# Patient Record
Sex: Female | Born: 1985 | Race: Asian | Hispanic: No | Marital: Married | Smoking: Former smoker
Health system: Southern US, Community
[De-identification: ages and names within clinical notes are randomized; demographics above are authoritative.]

## PROBLEM LIST (undated history)

## (undated) DIAGNOSIS — Z1509 Genetic susceptibility to other malignant neoplasm: Secondary | ICD-10-CM

## (undated) DIAGNOSIS — E119 Type 2 diabetes mellitus without complications: Secondary | ICD-10-CM

## (undated) DIAGNOSIS — F32A Depression, unspecified: Secondary | ICD-10-CM

## (undated) DIAGNOSIS — F329 Major depressive disorder, single episode, unspecified: Secondary | ICD-10-CM

## (undated) DIAGNOSIS — I10 Essential (primary) hypertension: Secondary | ICD-10-CM

## (undated) DIAGNOSIS — F419 Anxiety disorder, unspecified: Secondary | ICD-10-CM

## (undated) HISTORY — DX: Essential (primary) hypertension: I10

## (undated) HISTORY — PX: TUBAL LIGATION: SHX77

## (undated) HISTORY — DX: Type 2 diabetes mellitus without complications: E11.9

## (undated) HISTORY — PX: CARPAL TUNNEL RELEASE: SHX101

## (undated) HISTORY — DX: Anxiety disorder, unspecified: F41.9

## (undated) HISTORY — DX: Depression, unspecified: F32.A

## (undated) HISTORY — DX: Genetic susceptibility to other malignant neoplasm: Z15.09

---
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
## 2007-04-22 ENCOUNTER — Ambulatory Visit: Payer: Self-pay | Admitting: Family Medicine

## 2011-07-11 ENCOUNTER — Ambulatory Visit: Payer: Self-pay | Admitting: Obstetrics and Gynecology

## 2011-07-21 ENCOUNTER — Encounter: Payer: Self-pay | Admitting: Maternal & Fetal Medicine

## 2011-07-21 LAB — URINALYSIS, COMPLETE
Bacteria: NONE SEEN
Bilirubin,UR: NEGATIVE
Blood: NEGATIVE
Glucose,UR: 50 mg/dL (ref 0–75)
Leukocyte Esterase: NEGATIVE
Nitrite: NEGATIVE
RBC,UR: 1 /HPF (ref 0–5)
Squamous Epithelial: 5
WBC UR: 2 /HPF (ref 0–5)

## 2011-08-04 ENCOUNTER — Encounter: Payer: Self-pay | Admitting: Obstetrics and Gynecology

## 2011-08-04 LAB — URINALYSIS, COMPLETE
Bacteria: NONE SEEN
Blood: NEGATIVE
Glucose,UR: NEGATIVE mg/dL (ref 0–75)
Nitrite: NEGATIVE
Ph: 6 (ref 4.5–8.0)
RBC,UR: 2 /HPF (ref 0–5)
Specific Gravity: 1.025 (ref 1.003–1.030)

## 2011-08-11 ENCOUNTER — Ambulatory Visit: Payer: Self-pay | Admitting: Obstetrics and Gynecology

## 2011-10-07 ENCOUNTER — Encounter: Payer: Self-pay | Admitting: Pediatric Cardiology

## 2012-01-23 ENCOUNTER — Inpatient Hospital Stay: Payer: Self-pay

## 2012-01-24 LAB — BASIC METABOLIC PANEL
Anion Gap: 8 (ref 7–16)
BUN: 6 mg/dL — ABNORMAL LOW (ref 7–18)
Calcium, Total: 8.4 mg/dL — ABNORMAL LOW (ref 8.5–10.1)
Chloride: 108 mmol/L — ABNORMAL HIGH (ref 98–107)
Co2: 23 mmol/L (ref 21–32)
Co2: 24 mmol/L (ref 21–32)
Creatinine: 0.62 mg/dL (ref 0.60–1.30)
EGFR (African American): >60
EGFR (Non-African Amer.): >60
Glucose: 93 mg/dL (ref 65–99)
Glucose: 105 mg/dL — ABNORMAL HIGH (ref 65–99)
Osmolality: 274 (ref 275–301)
Potassium: 3.8 mmol/L (ref 3.5–5.1)
Sodium: 141 mmol/L (ref 136–145)
Sodium: 138 mmol/L (ref 136–145)

## 2012-01-24 LAB — CBC WITH DIFFERENTIAL/PLATELET
Basophil %: 0.5 %
HCT: 35.4 % (ref 35.0–47.0)
HGB: 11.6 g/dL — ABNORMAL LOW (ref 12.0–16.0)
MCH: 26.3 pg (ref 26.0–34.0)
MCHC: 32.8 g/dL (ref 32.0–36.0)
MCV: 80 fL (ref 80–100)
Monocyte #: 0.8 x10 3/mm (ref 0.2–0.9)
Neutrophil #: 9.9 10*3/uL — ABNORMAL HIGH (ref 1.4–6.5)
RBC: 4.4 10*6/uL (ref 3.80–5.20)
RDW: 14.8 % — ABNORMAL HIGH (ref 11.5–14.5)

## 2012-01-24 LAB — PLATELET COUNT: Platelet: 261 10*3/uL (ref 150–440)

## 2012-01-24 LAB — GLUCOSE, RANDOM: Glucose: 115 mg/dL — ABNORMAL HIGH (ref 65–99)

## 2012-01-25 LAB — HEMATOCRIT: HCT: 36 %

## 2012-03-30 ENCOUNTER — Ambulatory Visit: Payer: Self-pay | Admitting: Obstetrics and Gynecology

## 2012-03-30 LAB — URINALYSIS, COMPLETE
Glucose,UR: NEGATIVE mg/dL (ref 0–75)
Granular Cast: 1
Ketone: NEGATIVE
Nitrite: NEGATIVE
Ph: 5 (ref 4.5–8.0)
Protein: NEGATIVE
Specific Gravity: 1.005 (ref 1.003–1.030)

## 2012-03-30 LAB — GLUCOSE, RANDOM: Glucose: 109 mg/dL — ABNORMAL HIGH (ref 65–99)

## 2012-03-30 LAB — HEMOGLOBIN: HGB: 13.5 g/dL (ref 12.0–16.0)

## 2012-03-30 LAB — PREGNANCY, URINE: Pregnancy Test, Urine: NEGATIVE m[IU]/mL

## 2012-04-05 ENCOUNTER — Ambulatory Visit: Payer: Self-pay | Admitting: Obstetrics and Gynecology

## 2013-05-04 IMAGING — US US OB NUCHAL TRANSLUCENCY 1ST GEST - [facility] NRPT
1 series · 14 of 28 positions shown · non-contrast
Comparison: none

[Series 1: us ob nuchal translucency 1st gest - [facility] nrpt · 14 of 51 slices shown]
[im 2/51]
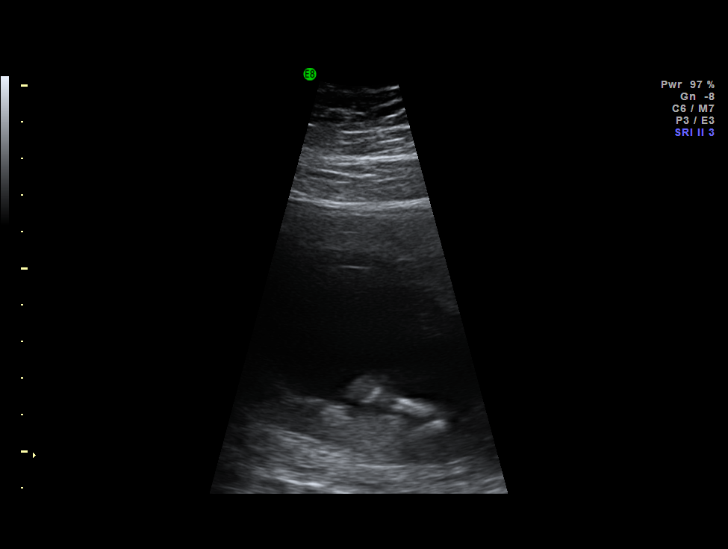
[im 6/51]
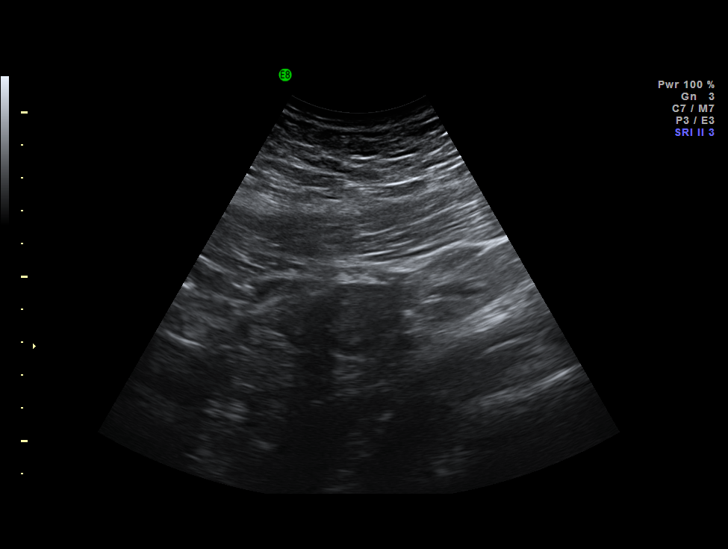
[im 10/51]
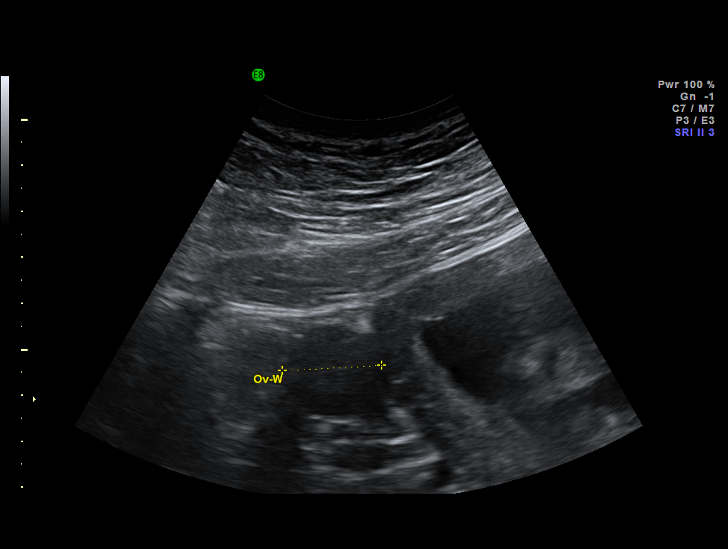
[im 13/51]
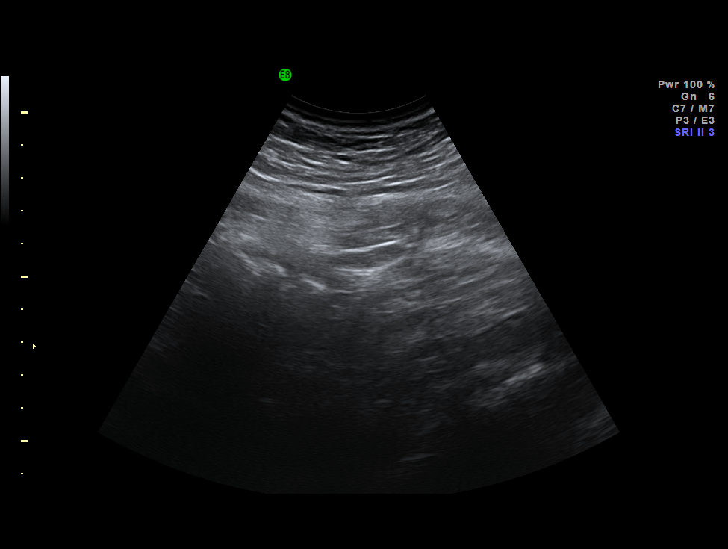
[im 17/51]
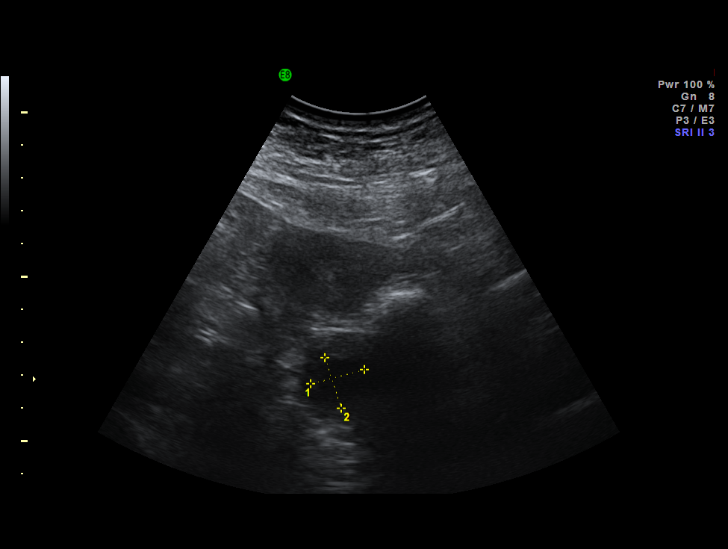
[im 21/51]
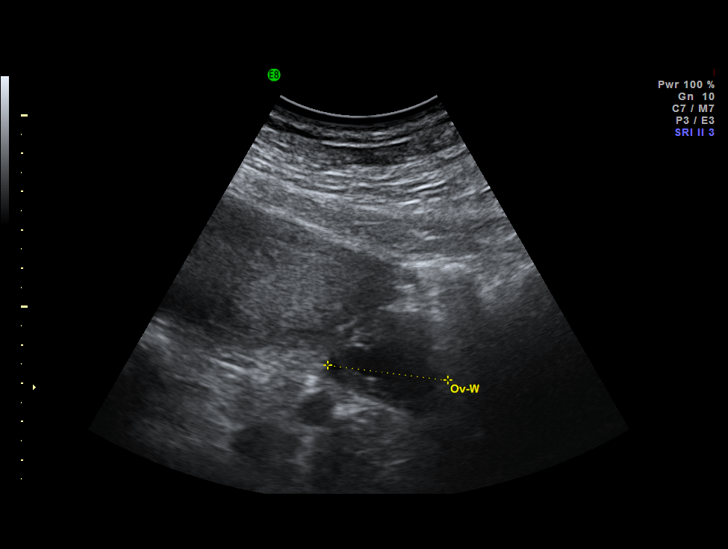
[im 25/51]
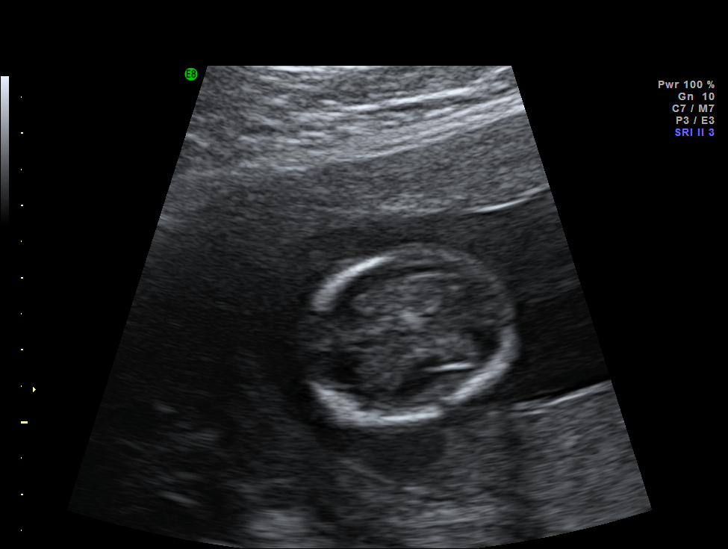
[im 28/51]
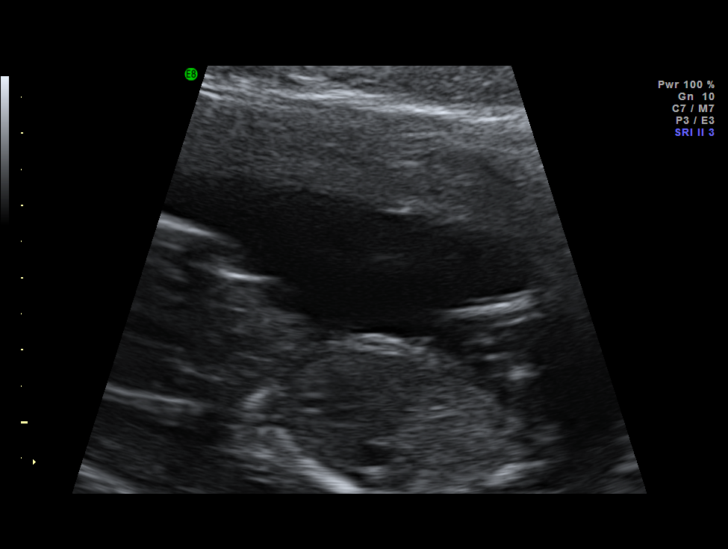
[im 32/51]
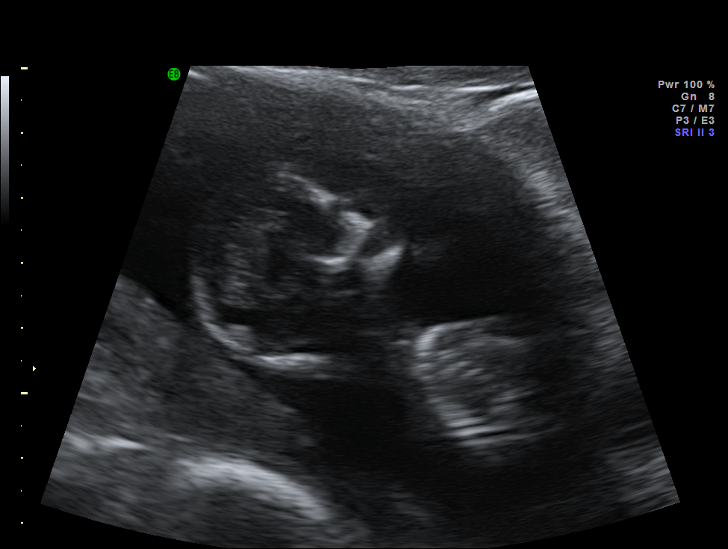
[im 36/51]
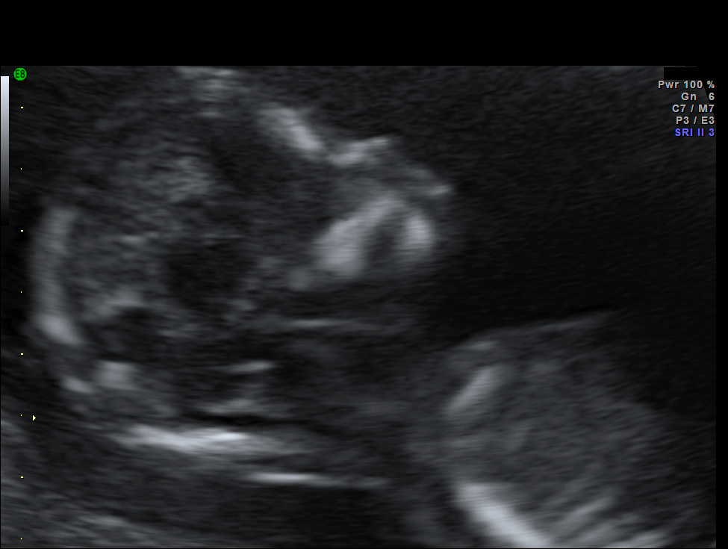
[im 39/51]
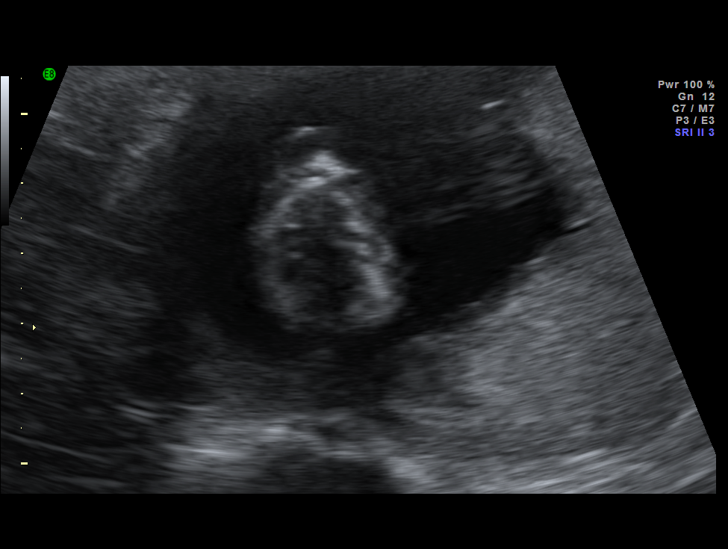
[im 43/51]
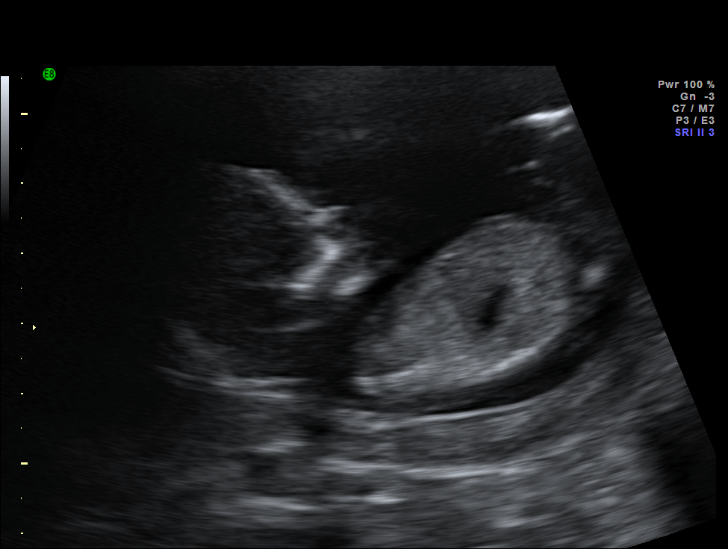
[im 47/51]
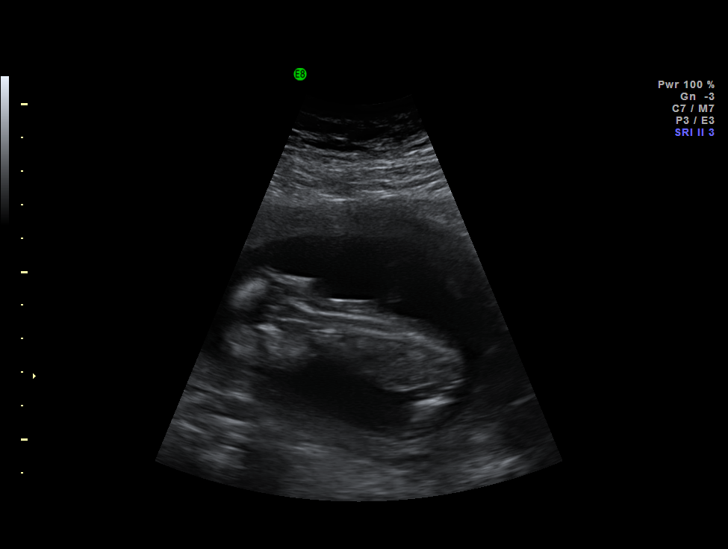
[im 51/51]
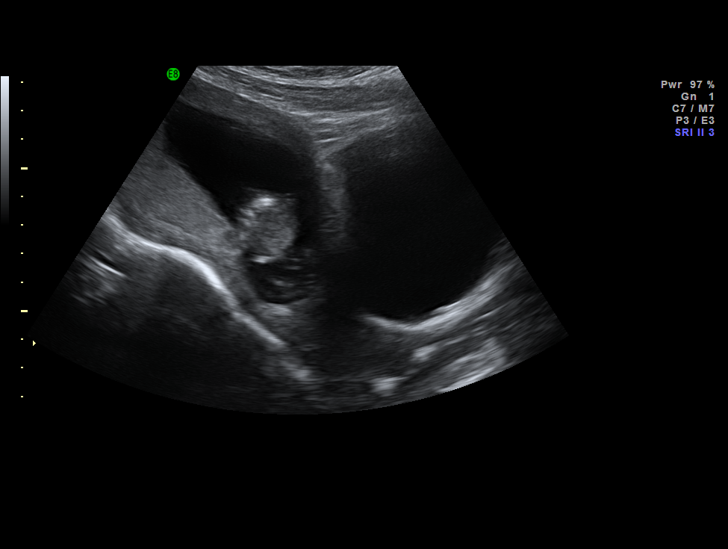

[14 of 28 positions shown; findings below may reference images not displayed]

IMAGES IMPORTED FROM THE SYNGO WORKFLOW SYSTEM
NO DICTATION FOR STUDY

## 2013-05-16 LAB — HM DIABETES EYE EXAM

## 2013-05-31 ENCOUNTER — Encounter (INDEPENDENT_AMBULATORY_CARE_PROVIDER_SITE_OTHER): Payer: Self-pay

## 2013-05-31 ENCOUNTER — Encounter: Payer: Self-pay | Admitting: Adult Health

## 2013-05-31 ENCOUNTER — Ambulatory Visit (INDEPENDENT_AMBULATORY_CARE_PROVIDER_SITE_OTHER): Payer: BC Managed Care – PPO | Admitting: Adult Health

## 2013-05-31 VITALS — BP 108/68 | HR 88 | Temp 98.2°F | Resp 12 | Ht 62.5 in | Wt 212.0 lb

## 2013-05-31 DIAGNOSIS — E119 Type 2 diabetes mellitus without complications: Secondary | ICD-10-CM | POA: Insufficient documentation

## 2013-05-31 DIAGNOSIS — F17210 Nicotine dependence, cigarettes, uncomplicated: Secondary | ICD-10-CM | POA: Insufficient documentation

## 2013-05-31 DIAGNOSIS — F17200 Nicotine dependence, unspecified, uncomplicated: Secondary | ICD-10-CM

## 2013-05-31 DIAGNOSIS — E1165 Type 2 diabetes mellitus with hyperglycemia: Secondary | ICD-10-CM

## 2013-05-31 DIAGNOSIS — IMO0001 Reserved for inherently not codable concepts without codable children: Secondary | ICD-10-CM

## 2013-05-31 DIAGNOSIS — Z716 Tobacco abuse counseling: Secondary | ICD-10-CM

## 2013-05-31 DIAGNOSIS — E785 Hyperlipidemia, unspecified: Secondary | ICD-10-CM | POA: Insufficient documentation

## 2013-05-31 DIAGNOSIS — Z7189 Other specified counseling: Secondary | ICD-10-CM

## 2013-05-31 DIAGNOSIS — Z0000 Encounter for general adult medical examination without abnormal findings: Secondary | ICD-10-CM

## 2013-05-31 DIAGNOSIS — Z794 Long term (current) use of insulin: Secondary | ICD-10-CM | POA: Insufficient documentation

## 2013-05-31 MED ORDER — METFORMIN HCL 1000 MG PO TABS: 1000.0000 mg | ORAL_TABLET | Freq: Two times a day (BID) | ORAL | Status: DC

## 2013-05-31 MED ORDER — ROSUVASTATIN CALCIUM 10 MG PO TABS: 10.0000 mg | ORAL_TABLET | Freq: Every day | ORAL | Status: DC

## 2013-05-31 NOTE — Assessment & Plan Note (Signed)
Increase metformin to 1000 mg bid with meals. Check HgbA1c. Check blood glucose before meals and at bedtime. Record. Return in 2 weeks. Consider insulin if HgbA1c elevated. Discussed with pt.

## 2013-05-31 NOTE — Assessment & Plan Note (Signed)
On crestor 10 mg. Check lipids in February. Prescription provided to have done at [facility]

## 2013-05-31 NOTE — Assessment & Plan Note (Signed)
Already has multiple risk factors for heart dz - weight, diabetes, HLD, Hypertriglyceridemia. She had quit smoking when first diagnosed with HLD back in October. Mother just was diagnosed with breast cancer and pt began smoking again. Discussed at length importance of smoking cessation. She is determined to quit again. Will continue to follow.

## 2013-05-31 NOTE — Patient Instructions (Signed)
  Start metformin 1000 mg twice a day with breakfast and dinner.  Check your blood sugar before breakfast, before lunch, before dinner, and before bedtime. Do this for 2 weeks and record.  Return for follow up appointment in 2 weeks with your recorded blood sugars

## 2013-05-31 NOTE — Progress Notes (Signed)
Pre visit review using our clinic review tool, if applicable. No additional management support is needed unless otherwise documented below in the visit note. 

## 2013-05-31 NOTE — Assessment & Plan Note (Signed)
Normal physical exam. Check labs cbc w/diff, tsh, lipids, cmet. Request labs from [facility]. Recommend smoking cessation. Discussed at length.

## 2013-05-31 NOTE — Progress Notes (Signed)
Subjective:    Patient ID: Allison [name], female    DOB: [birth year], 28 y.o.   MRN: [id number]  HPI  Allison [name] is a pleasant 28 y/o female who presents to establish care. She has been going to the wellness clinic at [facility] who has referred her to our office. Recently diagnosed with Hypertriglyceridemia, HLD and DM. On Metformin 500 mg daily with reported blood glucose levels running in the mid 200s. Hx of Gestational diabetes. For her elevated triglycerides she is taking Crestor 10 mg daily. Recent labs in November. Repeat in February. Has not had HgbA1c checked. She is feeling well today.     Past Medical History  Diagnosis Date  . Diabetes mellitus without complication      Past Surgical History  Procedure Laterality Date  . Cholecystectomy  2003     Family History  Problem Relation Age of Onset  . Cancer Mother 78    breast cancer  . Diabetes Mother   . Multiple sclerosis Father 75    Progressive - not diagnosed until age 43  . Diabetes Paternal Aunt   . Diabetes Maternal Grandfather   . Diabetes Paternal Aunt      History   Social History  . Marital Status: Married    Spouse Name: Richard    Number of Children: 2  . Years of Education: 14   Occupational History  . Physical Department/Environmental Services [employer]   Social History Main Topics  . Smoking status: Current Every Day Smoker -- 0.25 packs/day    Types: Cigarettes  . Smokeless tobacco: Never Used  . Alcohol Use: Yes     Comment: Rare   . Drug Use: No  . Sexual Activity: Yes   Other Topics Concern  . Not on file   Social History Narrative   Allison [name] grew up in [place], [place]. She is married and has 2 children. She enjoys spending time with her family. She enjoys reading and shopping.       Smokes < 10 cigarettes daily - trying to quit   Caffeine - drinks 3 cans of diet pepsi daily   Alcohol - rare      Review of Systems  Constitutional: Negative.   HENT: Negative.   Eyes: Negative.     Respiratory: Negative.   Cardiovascular: Negative.   Gastrointestinal: Negative.   Endocrine: Negative.   Genitourinary: Negative.   Musculoskeletal: Negative.   Skin: Negative.   Allergic/Immunologic: Negative.   Neurological: Negative.   Hematological: Negative.   Psychiatric/Behavioral: Negative.        Objective:   Physical Exam  Constitutional: She is oriented to person, place, and time. She appears well-developed and well-nourished. No distress.  HENT:  Head: Normocephalic and atraumatic.  Right Ear: External ear normal.  Left Ear: External ear normal.  Nose: Nose normal.  Mouth/Throat: Oropharynx is clear and moist.  Eyes: Conjunctivae and EOM are normal. Pupils are equal, round, and reactive to light.  Neck: Normal range of motion. Neck supple. No tracheal deviation present. No thyromegaly present.  Cardiovascular: Normal rate, regular rhythm, normal heart sounds and intact distal pulses.  Exam reveals no gallop and no friction rub.   No murmur heard. Pulmonary/Chest: Effort normal and breath sounds normal. No respiratory distress. She has no wheezes. She has no rales.  Abdominal: Soft. Bowel sounds are normal. She exhibits no distension and no mass. There is no tenderness. There is no rebound and no guarding.  Musculoskeletal: Normal range of motion. She exhibits  no edema and no tenderness.  Lymphadenopathy:    She has no cervical adenopathy.  Neurological: She is alert and oriented to person, place, and time. She has normal reflexes. No cranial nerve deficit. Coordination normal.  Skin: Skin is warm and dry.  Psychiatric: She has a normal mood and affect. Her behavior is normal. Judgment and thought content normal.       Assessment & Plan:

## 2013-06-03 ENCOUNTER — Telehealth: Payer: Self-pay

## 2013-06-03 NOTE — Telephone Encounter (Signed)
Relevant patient education assigned to patient using Emmi. ° °

## 2013-06-09 ENCOUNTER — Telehealth: Payer: Self-pay | Admitting: Adult Health

## 2013-06-09 ENCOUNTER — Other Ambulatory Visit: Payer: Self-pay | Admitting: Adult Health

## 2013-06-09 MED ORDER — INSULIN DETEMIR 100 UNIT/ML FLEXPEN: 10.0000 [IU] | PEN_INJECTOR | Freq: Every day | SUBCUTANEOUS | Status: DC

## 2013-06-09 NOTE — Telephone Encounter (Signed)
HgbA1c is 11.3%. She is on metformin bid. Start Levemir 10 units at bedtime. Return for follow up in 1 month. If she needs instruction on using the flexpen, please have her bring her pen and set up a nurse visit for instruction.

## 2013-06-10 NOTE — Telephone Encounter (Signed)
Left message with pt's husband, pt does not have a way to be contacted. He will have her call back Monday. Rx at desk until pharmacy given.

## 2013-06-13 ENCOUNTER — Encounter: Payer: Self-pay | Admitting: *Deleted

## 2013-06-13 ENCOUNTER — Telehealth: Payer: Self-pay | Admitting: Adult Health

## 2013-06-13 NOTE — Telephone Encounter (Signed)
Relevant patient education assigned to patient using Emmi. ° °

## 2013-06-15 ENCOUNTER — Encounter: Payer: Self-pay | Admitting: *Deleted

## 2013-06-15 NOTE — Telephone Encounter (Signed)
Unable to reach pt, letter mailed with information and request to call office to discuss.

## 2013-06-20 ENCOUNTER — Ambulatory Visit: Payer: BC Managed Care – PPO | Admitting: Adult Health

## 2013-06-27 ENCOUNTER — Ambulatory Visit (INDEPENDENT_AMBULATORY_CARE_PROVIDER_SITE_OTHER): Payer: BC Managed Care – PPO | Admitting: Adult Health

## 2013-06-27 ENCOUNTER — Encounter: Payer: Self-pay | Admitting: Adult Health

## 2013-06-27 VITALS — BP 108/64 | HR 86 | Temp 98.1°F | Resp 14 | Wt 218.0 lb

## 2013-06-27 DIAGNOSIS — E1165 Type 2 diabetes mellitus with hyperglycemia: Principal | ICD-10-CM

## 2013-06-27 DIAGNOSIS — IMO0001 Reserved for inherently not codable concepts without codable children: Secondary | ICD-10-CM

## 2013-06-27 MED ORDER — INSULIN ASPART PROT & ASPART (70-30 MIX) 100 UNIT/ML PEN: 10.0000 [IU] | PEN_INJECTOR | Freq: Two times a day (BID) | SUBCUTANEOUS | Status: DC

## 2013-06-27 NOTE — Progress Notes (Signed)
Pre visit review using our clinic review tool, if applicable. No additional management support is needed unless otherwise documented below in the visit note. 

## 2013-06-27 NOTE — Progress Notes (Signed)
Patient ID: Allison [name], female   DOB: 12/02/1985, 28 y.o.   MRN: [id number]  .   Subjective:    Patient ID: Allison [name], female    DOB: 12/02/1985, 28 y.o.   MRN: [id number]  HPI  Allison [name] is here for 2 week follow up for diabetes. I started her on Levemir and increased her metformin to 1000 mg bid. She never started the Levemir 2/2 cost but she did not call to inform me of this.  Her blood glucose levels are still too high running in the 200s to 300s.   Past Medical History  Diagnosis Date  . Diabetes mellitus without complication     Current Outpatient Prescriptions on File Prior to Visit  Medication Sig Dispense Refill  . metFORMIN (GLUCOPHAGE) 1000 MG tablet Take 1 tablet (1,000 mg total) by mouth 2 (two) times daily with a meal.  60 tablet  3  . rosuvastatin (CRESTOR) 10 MG tablet Take 1 tablet (10 mg total) by mouth daily.  30 tablet  3  . VITAMIN D, CHOLECALCIFEROL, PO Take 1,000 Units by mouth 2 (two) times daily.        No current facility-administered medications on file prior to visit.     Review of Systems  Constitutional: Negative.   HENT: Negative.   Eyes: Negative.   Respiratory: Negative.   Cardiovascular: Negative.   Gastrointestinal: Negative.   Endocrine: Negative.   Genitourinary: Negative.   Musculoskeletal: Negative.   Skin: Negative.   Allergic/Immunologic: Negative.   Neurological: Negative.   Hematological: Negative.   Psychiatric/Behavioral: Negative.        Objective:  BP 108/64  Pulse 86  Temp(Src) 98.1 F (36.7 C) (Oral)  Resp 14  Wt 218 lb (98.884 kg)  SpO2 97%  LMP 06/03/2013   Physical Exam  Constitutional: She is oriented to person, place, and time. No distress.  HENT:  Head: Normocephalic and atraumatic.  Eyes: Conjunctivae and EOM are normal.  Neck: Normal range of motion. Neck supple.  Cardiovascular: Normal rate and regular rhythm.   Pulmonary/Chest: Effort normal. No respiratory distress.  Abdominal: Soft. Bowel sounds  are normal.  Musculoskeletal: Normal range of motion. She exhibits no edema.  Neurological: She is alert and oriented to person, place, and time. She has normal reflexes. Coordination normal.  Skin: Skin is warm and dry.  Psychiatric: She has a normal mood and affect. Her behavior is normal. Judgment and thought content normal.       Assessment & Plan:   1. Diabetes mellitus type 2, uncontrolled, without complications Switch to NovoLog Mix 70/30 - start with 10 units before breakfast and 10 units before dinner. Monitor fasting BG 4 times a day for 2 weeks and send in records for adjustments of insulin as needed. Continue metformin. Will recheck A1C in 3 months.

## 2013-06-27 NOTE — Patient Instructions (Signed)
  Start NovoLog Mix 70/30 insulin 10 units before breakfast and 10 units before dinner.  Monitor your blood glucose levels before every meal and before bedtime and record.  Please send me a records of your blood glucose level in approximately 2 weeks. We will make adjustments based on these levels.

## 2013-06-29 ENCOUNTER — Telehealth: Payer: Self-pay | Admitting: Adult Health

## 2013-06-29 NOTE — Telephone Encounter (Signed)
Relevant patient education assigned to patient using Emmi. ° °

## 2013-06-30 ENCOUNTER — Telehealth: Payer: Self-pay | Admitting: Adult Health

## 2013-06-30 NOTE — Telephone Encounter (Signed)
The patient has tried to get two different insuline prescription filled ,but both have cost to much. Is there a less expensive alternative.

## 2013-07-01 MED ORDER — INSULIN ASPART PROT & ASPART (70-30 MIX) 100 UNIT/ML PEN: 10.0000 [IU] | PEN_INJECTOR | Freq: Two times a day (BID) | SUBCUTANEOUS | Status: DC

## 2013-07-01 NOTE — Telephone Encounter (Signed)
Spoke with pt. One pen was going to cost her $105 at local pharmacy. She had already called her insurance, was told it would be cheaper if she used [facility], $70 for 3 months supply. States [facility] had faxed Rx request. I don't see where we have received it, so escripted Novolog 70/30 flex pen to [facility]. She was told it may take 5 days for her to get the Rx. Advised pt that I left a coupon up front for pick up for 30 day trial of novolog 70/30 for her to take to local pharmacy to get started on her insulin prior to mail order arriving.

## 2013-07-01 NOTE — Telephone Encounter (Signed)
The NovoLog Mix 70/30 is on her preferred list with [employer]. Can you find out how much she was having to pay for this?

## 2013-10-31 ENCOUNTER — Ambulatory Visit: Payer: BC Managed Care – PPO | Admitting: Adult Health

## 2013-12-05 ENCOUNTER — Encounter: Payer: Self-pay | Admitting: Adult Health

## 2013-12-05 ENCOUNTER — Other Ambulatory Visit: Payer: Self-pay

## 2013-12-05 ENCOUNTER — Ambulatory Visit (INDEPENDENT_AMBULATORY_CARE_PROVIDER_SITE_OTHER): Payer: BC Managed Care – PPO | Admitting: Adult Health

## 2013-12-05 VITALS — BP 134/87 | HR 84 | Temp 98.4°F | Resp 14 | Wt 223.0 lb

## 2013-12-05 DIAGNOSIS — E1165 Type 2 diabetes mellitus with hyperglycemia: Principal | ICD-10-CM

## 2013-12-05 DIAGNOSIS — IMO0001 Reserved for inherently not codable concepts without codable children: Secondary | ICD-10-CM

## 2013-12-05 DIAGNOSIS — Z7182 Exercise counseling: Secondary | ICD-10-CM | POA: Insufficient documentation

## 2013-12-05 DIAGNOSIS — E785 Hyperlipidemia, unspecified: Secondary | ICD-10-CM

## 2013-12-05 MED ORDER — INSULIN PEN NEEDLE 32G X 6 MM MISC: Status: DC

## 2013-12-05 MED ORDER — ROSUVASTATIN CALCIUM 10 MG PO TABS: 10.0000 mg | ORAL_TABLET | Freq: Every day | ORAL | Status: DC

## 2013-12-05 MED ORDER — ACCU-CHEK MULTICLIX LANCETS MISC: Status: DC

## 2013-12-05 MED ORDER — GLUCOSE BLOOD VI STRP: ORAL_STRIP | Status: DC

## 2013-12-05 NOTE — Progress Notes (Signed)
Patient ID: Allison [name], female   DOB: 25-Nov-1985, 28 y.o.   MRN: [id number]   Subjective:    Patient ID: Allison DeutscherKimberly [name], female    DOB: 25-Nov-1985, 28 y.o.   MRN: [id number]  HPI  Allison BradfordKimberly is a pleasant 28 y/o female with hx of uncontrolled DM type 2. She was last seen in clinic in February and asked to monitor BG 4 times daily for 2 weeks since she was beginning the insulin 70/30. She did not bring record of BG with her. She reports that her numbers have improved. Last HgbA1c was > 11% and I wanted to see her for recheck in 3 months. She did not make her appt 2/2 reports that her father was hospitalized. She reports that she has been "doing better" for the last month. Very little to no exercise. Diet is still not where it needs to be but also reports improvement. Was previously eating little debbie cakes, chips and other junk/non nutritious foods. She reports she has cut out all soft drinks, doesn't eat the little debbie cakes any more and no chips.  Past Medical History  Diagnosis Date  . Diabetes mellitus without complication     Current Outpatient Prescriptions on File Prior to Visit  Medication Sig Dispense Refill  . Insulin Aspart Prot & Aspart (NOVOLOG MIX 70/30 FLEXPEN) (70-30) 100 UNIT/ML Pen Inject 10 Units into the skin 2 (two) times daily.  7 pen  1  . metFORMIN (GLUCOPHAGE) 1000 MG tablet Take 1 tablet (1,000 mg total) by mouth 2 (two) times daily with a meal.  60 tablet  3  . VITAMIN D, CHOLECALCIFEROL, PO Take 1,000 Units by mouth 2 (two) times daily.        No current facility-administered medications on file prior to visit.     Review of Systems  Constitutional: Negative.   HENT: Negative.   Eyes: Negative.   Respiratory: Negative.   Cardiovascular: Negative.   Gastrointestinal: Negative.   Endocrine: Negative.   Genitourinary: Negative.   Musculoskeletal: Negative.   Skin: Negative.   Allergic/Immunologic: Negative.   Neurological: Negative.   Hematological:  Negative.   Psychiatric/Behavioral: Negative.        Objective:  BP 134/87  Pulse 84  Temp(Src) 98.4 F (36.9 C) (Oral)  Resp 14  Wt 223 lb (101.152 kg)  SpO2 99%   Physical Exam  Constitutional: She is oriented to person, place, and time. No distress.  Overweight, pleasant 28 y/o female  HENT:  Head: Normocephalic and atraumatic.  Eyes: Conjunctivae and EOM are normal.  Neck: Normal range of motion. Neck supple.  Cardiovascular: Normal rate, regular rhythm, normal heart sounds and intact distal pulses.  Exam reveals no gallop and no friction rub.   No murmur heard. Pulmonary/Chest: Effort normal and breath sounds normal. No respiratory distress. She has no wheezes. She has no rales.  Musculoskeletal: Normal range of motion.  Neurological: She is alert and oriented to person, place, and time. She has normal reflexes. Coordination normal.  Skin: Skin is warm and dry.  Psychiatric: She has a normal mood and affect. Her behavior is normal. Judgment and thought content normal.      Assessment & Plan:   1. Diabetes mellitus type 2, uncontrolled, without complications Reports that she has been "doing better" for the last month. She was supposed to follow up in May and missed appt. Needs to take her diabetes seriously. We discussed this in detail. She is young and if she does not control this  the results will be end organ damage for the future. Discussed that there are many people who live with diabetes and this is a controlled dz. She needs to lose weight and exercise which will greatly improve her numbers and the overall way she feels. I am checking labs. She is on 70/30 insulin 10 units bid. She is also on metformin 1000 mg bid with meals. She had not been consistent with metformin until ~ 1 month ago. Follow up 3 months.  2. HLD (hyperlipidemia) Restarted her Crestor. She ran out of medication and did not get this refilled. This was originally prescribed by another provider. Her  triglycerides were upwards of 700. Last lipids showed triglycerides in the 200. Discussed needing to get this below 150 through diet and exercise. Ordered fasting lipids. She has all labs done through [facility].  3. Exercise counseling Allison [name] has not exercised in years. She recently started running for 15 min but states she was exhausted. The saying "you have to walk before you run" totally applies to this scenario. I explained to her that she has to build up her stamina. Of course she is exhausted after running 15 min! Her body is is shock from doing any exercise. Discussed with her, at length, that she needs to begin walking briskly. If she can have a conversation while walking she is doing it too slow. Start by walking 15 min but needs to increase time. Has to set goals and try to attain one at a time. She agreed and was going to begin doing this.

## 2013-12-05 NOTE — Progress Notes (Signed)
Pre visit review using our clinic review tool, if applicable. No additional management support is needed unless otherwise documented below in the visit note. 

## 2013-12-07 LAB — LIPID PANEL
Cholesterol: 134 mg/dL (ref 0–200)
HDL: 25 mg/dL — AB (ref 35–70)
LDL Cholesterol: 62 mg/dL
Triglycerides: 235 mg/dL — AB (ref 40–160)

## 2013-12-07 LAB — HEMOGLOBIN A1C: Hgb A1c MFr Bld: 7.9 % — AB (ref 4.0–6.0)

## 2013-12-14 ENCOUNTER — Encounter: Payer: Self-pay | Admitting: *Deleted

## 2013-12-23 ENCOUNTER — Telehealth: Payer: Self-pay | Admitting: Adult Health

## 2013-12-23 NOTE — Telephone Encounter (Signed)
Results labs

## 2014-01-24 ENCOUNTER — Other Ambulatory Visit: Payer: Self-pay | Admitting: Adult Health

## 2014-01-27 ENCOUNTER — Other Ambulatory Visit: Payer: Self-pay | Admitting: Adult Health

## 2014-01-27 MED ORDER — METFORMIN HCL 1000 MG PO TABS: ORAL_TABLET | ORAL | Status: DC

## 2014-02-15 ENCOUNTER — Telehealth: Payer: Self-pay

## 2014-02-15 MED ORDER — INSULIN ASPART PROT & ASPART (70-30 MIX) 100 UNIT/ML PEN: 10.0000 [IU] | PEN_INJECTOR | Freq: Two times a day (BID) | SUBCUTANEOUS | Status: DC

## 2014-02-15 NOTE — Telephone Encounter (Signed)
Pt called and is hoping to get a refill of her Novolog  ([facility]  Callback [phone]

## 2014-03-02 ENCOUNTER — Telehealth: Payer: Self-pay

## 2014-03-02 NOTE — Telephone Encounter (Signed)
Unable to contact pt as phone is not in service.  However pt should have 5 refills remaining from 9.15.15 Rx,

## 2014-03-02 NOTE — Telephone Encounter (Signed)
The patient called and is in need of her metformin refilled.  She is planning on establishing care with the new NP when her schedule is open, but is need of this med until she can establish care.

## 2014-06-05 ENCOUNTER — Ambulatory Visit: Payer: Self-pay | Admitting: Nurse Practitioner

## 2014-06-19 ENCOUNTER — Ambulatory Visit: Payer: Self-pay | Admitting: Nurse Practitioner

## 2014-06-22 ENCOUNTER — Encounter: Payer: Self-pay | Admitting: Nurse Practitioner

## 2014-06-22 ENCOUNTER — Ambulatory Visit (INDEPENDENT_AMBULATORY_CARE_PROVIDER_SITE_OTHER): Payer: BLUE CROSS/BLUE SHIELD | Admitting: Nurse Practitioner

## 2014-06-22 VITALS — BP 122/80 | HR 82 | Temp 98.2°F | Resp 12 | Ht 62.5 in | Wt 195.0 lb

## 2014-06-22 DIAGNOSIS — IMO0001 Reserved for inherently not codable concepts without codable children: Secondary | ICD-10-CM

## 2014-06-22 DIAGNOSIS — E1165 Type 2 diabetes mellitus with hyperglycemia: Secondary | ICD-10-CM

## 2014-06-22 DIAGNOSIS — F4310 Post-traumatic stress disorder, unspecified: Secondary | ICD-10-CM | POA: Insufficient documentation

## 2014-06-22 DIAGNOSIS — Z716 Tobacco abuse counseling: Secondary | ICD-10-CM

## 2014-06-22 MED ORDER — METFORMIN HCL 1000 MG PO TABS: ORAL_TABLET | ORAL | Status: DC

## 2014-06-22 MED ORDER — GLUCOSE BLOOD VI STRP: ORAL_STRIP | Status: DC

## 2014-06-22 MED ORDER — FLUOXETINE HCL 20 MG PO TABS: 20.0000 mg | ORAL_TABLET | Freq: Every day | ORAL | Status: DC

## 2014-06-22 NOTE — Patient Instructions (Addendum)
We will follow up in 6 weeks for diabetes and anxiety  Watch for low blood sugars- this may increase hypoglycemia
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
## 2014-06-22 NOTE — Assessment & Plan Note (Signed)
Pt interested in quitting smoking again. Going to [facility] center [facility] and getting advice. She states she likes the nicorette gum and they have a new flavor she is excited to try.

## 2014-06-22 NOTE — Progress Notes (Signed)
Pre visit review using our clinic review tool, if applicable. No additional management support is needed unless otherwise documented below in the visit note. 
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
## 2014-06-22 NOTE — Assessment & Plan Note (Signed)
Stable. Continue Metformin twice daily and Novolog Mix 70/30 16 units twice daily. Asked her to watch blood sugars for lows. Called in more strips for the patient. FU 3 months. Getting blood work at [facility].

## 2014-07-24 LAB — LIPID PANEL
Cholesterol: 161 mg/dL (ref 0–200)
HDL: 31 mg/dL — AB (ref 35–70)
LDL Cholesterol: 90 mg/dL
Triglycerides: 202 mg/dL — AB (ref 40–160)

## 2014-07-24 LAB — CBC AND DIFFERENTIAL
HCT: 42 % (ref 36–46)
HEMOGLOBIN: 13.7 g/dL (ref 12.0–16.0)
Neutrophils Absolute: 5 /uL
Platelets: 303 10*3/uL (ref 150–399)
WBC: 9 10^3/mL

## 2014-07-24 LAB — BASIC METABOLIC PANEL
BUN: 13 mg/dL (ref 4–21)
CREATININE: 0.8 mg/dL (ref 0.5–1.1)
Glucose: 85 mg/dL
Potassium: 4.6 mmol/L (ref 3.4–5.3)
SODIUM: 141 mmol/L (ref 137–147)

## 2014-07-24 LAB — TSH: TSH: 1.02 u[IU]/mL (ref 0.41–5.90)

## 2014-07-24 LAB — HEPATIC FUNCTION PANEL
ALK PHOS: 56 U/L (ref 25–125)
ALT: 14 U/L (ref 7–35)
AST: 9 U/L — AB (ref 13–35)
BILIRUBIN, TOTAL: 0.3 mg/dL

## 2014-07-24 LAB — HEMOGLOBIN A1C: Hgb A1c MFr Bld: 5.6 % (ref 4.0–6.0)

## 2014-07-25 ENCOUNTER — Encounter: Payer: Self-pay | Admitting: *Deleted

## 2014-08-02 ENCOUNTER — Telehealth: Payer: Self-pay | Admitting: *Deleted

## 2014-08-02 NOTE — Telephone Encounter (Signed)
Sent mychart msg.

## 2014-08-02 NOTE — Telephone Encounter (Signed)
-----   Message from Carollee Leitzarrie M Doss, NP sent at 08/01/2014  5:16 PM EDT ----- [name] had normal results except for low vitamin D. Take Vitamin D3 over the counter 2,000 units daily are her instructions. Thanks!

## 2014-08-07 ENCOUNTER — Ambulatory Visit: Payer: BLUE CROSS/BLUE SHIELD | Admitting: Nurse Practitioner

## 2014-08-07 ENCOUNTER — Telehealth: Payer: Self-pay

## 2014-08-07 NOTE — Telephone Encounter (Signed)
Called the patient to inquire about not showing for follow up, no answer lvmom.

## 2014-08-16 NOTE — Telephone Encounter (Signed)
Mailed unread message to pt  

## 2014-08-29 NOTE — Op Note (Signed)
PATIENT NAME:  Allison [name], Allison [name] MR#:  [id number] DATE OF BIRTH:  [birth year]  DATE OF PROCEDURE:  04/05/2012  PREOPERATIVE DIAGNOSIS: Desires sterilization.   POSTOPERATIVE DIAGNOSIS: Desires sterilization.   PROCEDURE: Laparoscopic tubal cautery.   SURGEON: Ricky L. Logan BoresEvans, MD   ANESTHESIA: General endotracheal by Dr. Maisie Fushomas    FINDINGS: Morbidly obese abdomen. Grossly normal uterus, tubes, and ovaries.   ESTIMATED BLOOD LOSS: Minimal.   DRAINS: In and out catheter with red rubber at the beginning of the case, approximately 75 mL.   SPECIMENS: None.   PROCEDURE IN DETAIL: The patient was consented, taken to the operating room and placed in the supine position where anesthesia was initiated. She was then placed in the dorsal lithotomy position, prepped and draped in the usual sterile fashion. Cervix was visualized. Hulka tenaculum was placed. Next, we turned our attention to the abdomen. An area in the inferior umbilicus was infused with 10 mL of 0.5% Sensorcaine. A 1 cm incision was placed and a #10 port was placed under direct visualization. Pneumoperitoneum was established. The patient was placed in the dorsal lithotomy position and we proceeded with laparoscopic tubal cautery in a relatively avascular portion of the ampullary region of bilateral fallopian tubes in the usual fashion. Pressure was lowered to 6 mmHg. Areas were seen to be hemostatic. Port removed. Incision was closed with deep of 0 and sub-Q with 3-0 Vicryl. Steri-Strips and Band-Aids were placed.      All instrument, needle, and sponge counts were correct. The patient received 2 grams Ancef IV preoperatively. I anticipate a routine postoperative course.   ____________________________ Reatha Harpsicky L. Logan BoresEvans, MD rle:drc [name]: 04/05/2012 11:18:25 ET T: 04/05/2012 11:35:33 ET JOB#: [id number]  cc: Ricky L. Logan BoresEvans, MD, <Dictator> Augustina MoodICK L Mahad Newstrom MD ELECTRONICALLY SIGNED 04/12/2012 7:28

## 2014-09-01 ENCOUNTER — Other Ambulatory Visit: Payer: Self-pay | Admitting: Internal Medicine
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
## 2014-09-19 NOTE — H&P (Signed)
L&D Evaluation:  History:   HPI 29 y/o G2P1001 @ 38wks EDC 02/07/12 arrives with c/o contractions/pelvic pressure, denies leaking fluid or vaginal bleeding, baby is active. Mucus discharge past few days. 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 GDM to oral agents with continued uncontrolled, began insulin. 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  HGB A1C first trimester 10.4%  Blood sugars frequently "forgot record" at home post prandial per pt ~ 120's. This evening dinner about 7pm ice cream with hot fudge and 2 servings macaroni and cheese. Current insulin dosing 20 units Lantus am +20 units Novalog am. 20 units Novalog dinner, 30 units Lantus pm. 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    Presents with contractions    Patient's Medical History Diabetes    Patient's Surgical History Colecystectomy    Medications Pre Natal Vitamins  Valtrex 500mg  BID began @ 36wks    Allergies NKDA    Social History none    Family History Non-Contributory   ROS:   ROS All systems were reviewed.  HEENT, CNS, GI, GU, Respiratory, CV, Renal and Musculoskeletal systems were found to be normal.   Exam:   Vital Signs stable    Urine Protein to lab    General no apparent distress    Mental Status clear    Chest clear    Heart normal sinus rhythm    Abdomen gravid, non-tender    Estimated Fetal Weight Average for gestational age    Fetal Position vtx    Fundal Height term recent NL US growth and AFI    Back no CVAT    Edema 2+  pedal    Reflexes 2+    Clonus negative    Pelvic no external lesions, 6cm 80% vtx @ -2 BBOW sm nl show    Mebranes Intact    FHT normal rate with no decels, baseline 130's accels 140's 150's +FM audible    Fetal Heart Rate 136    Ucx q 3/4 mins 45/60 sec moderate EFM adjusted    Skin dry    Lymph no lymphadenopathy   Impression:   Impression active labor   Plan:   Plan EFM/NST, monitor contractions and for cervical change,  antibiotics for GBBS prophylaxis    Comments Blood sugar 150 upon arrival. Dr Logan BoresEvans notified of patients assessment-orders received. Explained plan of care to pt/family including insulin drip per protocol with hourly blood glucose checks. Questions answered, what to expect discussed, consent obtained. Plans IV pain meds with labor progress, plans to decline epidural. IV ABX begun. Will notify Neonatologist/nursery. Postpartum will cease insulin drip and give half of prior insulinn regime pe Dr Logan BoresEvans orders/protocol.   Electronic Signatures: Albertina ParrLugiano, Karyssa Amaral B (CNM)  (Signed 13-Sep-13 23:29)  Authored: L&D Evaluation   Last Updated: 13-Sep-13 23:29 by Albertina ParrLugiano, Shenetta Schnackenberg B (CNM)

## 2015-04-26 ENCOUNTER — Other Ambulatory Visit: Payer: Self-pay

## 2015-04-26 NOTE — Telephone Encounter (Signed)
Patient called triage line for refills, last OV was in 06/22/2014. Please advise refill?

## 2015-05-08 ENCOUNTER — Other Ambulatory Visit: Payer: Self-pay | Admitting: Internal Medicine

## 2015-05-08 NOTE — Telephone Encounter (Signed)
Please call pt when you get a response, pt called regarding the medication Novolog. I told pt that it looks you are waiting on a response from the provider. Thank You!

## 2015-05-08 NOTE — Telephone Encounter (Signed)
Please advise on refill of Novolog. Patient not seen since 06/2014

## 2015-05-08 NOTE — Telephone Encounter (Signed)
Sent to wrong provider

## 2015-05-15 ENCOUNTER — Other Ambulatory Visit (HOSPITAL_COMMUNITY)
Admission: RE | Admit: 2015-05-15 | Discharge: 2015-05-15 | Disposition: A | Discharge status: Home / Self Care | Payer: BLUE CROSS/BLUE SHIELD | Source: Ambulatory Visit | Attending: Nurse Practitioner | Admitting: Nurse Practitioner

## 2015-05-15 ENCOUNTER — Encounter: Payer: Self-pay | Admitting: Nurse Practitioner

## 2015-05-15 ENCOUNTER — Ambulatory Visit (INDEPENDENT_AMBULATORY_CARE_PROVIDER_SITE_OTHER): Payer: BLUE CROSS/BLUE SHIELD | Admitting: Nurse Practitioner

## 2015-05-15 VITALS — BP 164/102 | HR 96 | Ht 62.0 in | Wt 221.0 lb

## 2015-05-15 DIAGNOSIS — Z01419 Encounter for gynecological examination (general) (routine) without abnormal findings: Secondary | ICD-10-CM | POA: Diagnosis not present

## 2015-05-15 DIAGNOSIS — IMO0001 Reserved for inherently not codable concepts without codable children: Secondary | ICD-10-CM

## 2015-05-15 DIAGNOSIS — E785 Hyperlipidemia, unspecified: Secondary | ICD-10-CM

## 2015-05-15 DIAGNOSIS — E1165 Type 2 diabetes mellitus with hyperglycemia: Secondary | ICD-10-CM

## 2015-05-15 DIAGNOSIS — Z0000 Encounter for general adult medical examination without abnormal findings: Secondary | ICD-10-CM

## 2015-05-15 DIAGNOSIS — I10 Essential (primary) hypertension: Secondary | ICD-10-CM

## 2015-05-15 DIAGNOSIS — Z0001 Encounter for general adult medical examination with abnormal findings: Secondary | ICD-10-CM | POA: Diagnosis not present

## 2015-05-15 DIAGNOSIS — F4310 Post-traumatic stress disorder, unspecified: Secondary | ICD-10-CM

## 2015-05-15 DIAGNOSIS — Z1151 Encounter for screening for human papillomavirus (HPV): Secondary | ICD-10-CM | POA: Insufficient documentation

## 2015-05-15 DIAGNOSIS — Z716 Tobacco abuse counseling: Secondary | ICD-10-CM

## 2015-05-15 MED ORDER — ACCU-CHEK MULTICLIX LANCETS MISC: Status: DC

## 2015-05-15 MED ORDER — HYDROCHLOROTHIAZIDE 25 MG PO TABS: 25.0000 mg | ORAL_TABLET | Freq: Every day | ORAL | Status: DC

## 2015-05-15 MED ORDER — BUPROPION HCL ER (XL) 150 MG PO TB24: 150.0000 mg | ORAL_TABLET | Freq: Every day | ORAL | Status: DC

## 2015-05-15 MED ORDER — INSULIN PEN NEEDLE 32G X 6 MM MISC: Status: DC

## 2015-05-15 MED ORDER — GLUCOSE BLOOD VI STRP
ORAL_STRIP | Status: DC
Start: 2015-05-15 — End: 2016-08-25

## 2015-05-15 MED ORDER — INSULIN ASPART PROT & ASPART (70-30 MIX) 100 UNIT/ML PEN: PEN_INJECTOR | SUBCUTANEOUS | Status: DC

## 2015-05-15 MED ORDER — BUSPIRONE HCL 5 MG PO TABS
5.0000 mg | ORAL_TABLET | Freq: Three times a day (TID) | ORAL | Status: DC
Start: 2015-05-15 — End: 2015-06-14

## 2015-05-15 NOTE — Progress Notes (Signed)
Patient ID: Allison [name], female    DOB: 1986/05/01  Age: 30 y.o. MRN: [id number]  CC: Annual Exam   HPI [address] Allison [name] presents for Annual Physical   1) Annual Physical   Diet- Cutting back on sugar, small meals   Exercise- Gym, mom going with her, [employer] fit app   Immunizations-    Flu- [facility]   [place] Exam- Last year   Dental Exam- [facility]  LMP- 2 weeks ago   Labs- [facility]   Depression- Eating excessively, crying easily, overwhelmed    PHQ-9 = 20, See PTSD notes   Refills: out of strips, lancets, metformin  HTN- needs medication to control BP  History Allison [name] has a past medical history of Diabetes mellitus without complication (HCC).   She has past surgical history that includes Cholecystectomy (2003).   Her family history includes Cancer (age of onset: 65) in her mother; Diabetes in her maternal grandfather, mother, paternal aunt, and paternal aunt; Multiple sclerosis (age of onset: 68) in her father.She reports that she has been smoking Cigarettes.  She started smoking about 9 years ago. She has been smoking about 0.50 packs per day. She has never used smokeless tobacco. She reports that she drinks alcohol. She reports that she does not use illicit drugs.  Outpatient Prescriptions Prior to Visit  Medication Sig Dispense Refill  . glucose blood test strip Use as instructed 100 each 6  . Insulin Pen Needle (CAREFINE PEN NEEDLES) 32G X 6 MM MISC Use as directed 100 each 3  . Lancets (ACCU-CHEK MULTICLIX) lancets Use as instructed 100 each 6  . NOVOLOG MIX 70/30 FLEXPEN (70-30) 100 UNIT/ML FlexPen INJECT 10 UNITS INTO THE SKIN 2 TIMES DAILY 7 pen 1  . metFORMIN (GLUCOPHAGE) 1000 MG tablet TAKE ONE TABLET BY MOUTH TWICE DAILY WITH A MEAL 60 tablet 5  . FLUoxetine (PROZAC) 20 MG tablet Take 1 tablet (20 mg total) by mouth daily. (Patient not taking: Reported on 05/15/2015) 30 tablet 1   No facility-administered medications prior to visit.    HCTZ  ROS Review of Systems  Constitutional: Negative for fever, chills, diaphoresis, fatigue and unexpected weight change.  HENT: Negative for tinnitus and trouble swallowing.   Eyes: Negative for visual disturbance.  Respiratory: Negative for chest tightness, shortness of breath and wheezing.   Cardiovascular: Negative for chest pain, palpitations and leg swelling.  Gastrointestinal: Negative for nausea, vomiting, abdominal pain, diarrhea, constipation and blood in stool.  Endocrine: Negative for polydipsia, polyphagia and polyuria.  Genitourinary: Negative for dysuria, hematuria, vaginal discharge and vaginal pain.  Musculoskeletal: Negative for myalgias, back pain, arthralgias and gait problem.  Skin: Negative for color change and rash.  Neurological: Negative for dizziness, weakness, numbness and headaches.  Hematological: Does not bruise/bleed easily.  Psychiatric/Behavioral: Negative for suicidal ideas and sleep disturbance. The patient is not nervous/anxious.     Objective:  BP 164/102 mmHg  Pulse 96  Ht 5\' 2"  (1.575 m)  Wt 221 lb (100.245 kg)  BMI 40.41 kg/m2  SpO2 100%  LMP 05/03/2015 (Approximate)  Physical Exam  Constitutional: She is oriented to person, place, and time. She appears well-developed and well-nourished. No distress.  HENT:  Head: Normocephalic and atraumatic.  Right Ear: External ear normal.  Left Ear: External ear normal.  Nose: Nose normal.  Mouth/Throat: Oropharynx is clear and moist. No oropharyngeal exudate.  TMs and canals clear bilaterally Nose piercing, tongue piercing  Eyes: Conjunctivae and EOM are normal. Pupils are equal,  round, and reactive to light. Right eye exhibits no discharge. Left eye exhibits no discharge. No scleral icterus.  Glasses  Neck: Normal range of motion. Neck supple. No thyromegaly present.  Cardiovascular: Normal rate, regular rhythm, normal heart sounds and intact distal pulses.  Exam reveals no gallop and no  friction rub.   No murmur heard. Pulmonary/Chest: Effort normal and breath sounds normal. No respiratory distress. She has no wheezes. She has no rales. She exhibits no tenderness.  Clinical breast exam without significant findings  Abdominal: Soft. Bowel sounds are normal. She exhibits no distension and no mass. There is no tenderness. There is no rebound and no guarding.  Obese  Musculoskeletal: Normal range of motion. She exhibits no edema or tenderness.  Lymphadenopathy:    She has no cervical adenopathy.  Neurological: She is alert and oriented to person, place, and time. She has normal reflexes. No cranial nerve deficit. She exhibits normal muscle tone. Coordination normal.  Skin: Skin is warm and dry. No rash noted. She is not diaphoretic. No erythema. No pallor.  Psychiatric: She has a normal mood and affect. Her behavior is normal. Judgment and thought content normal.   Assessment & Plan:   Allison [name] was seen today for annual exam.  Diagnoses and all orders for this visit:  Encounter for routine gynecological examination -     Cytology - PAP  Routine general medical examination at a health care facility -     Cytology - PAP  Other orders -     insulin aspart protamine - aspart (NOVOLOG MIX 70/30 FLEXPEN) (70-30) 100 UNIT/ML FlexPen; INJECT 10 UNITS INTO THE SKIN 2 TIMES DAILY -     hydrochlorothiazide (HYDRODIURIL) 25 MG tablet; Take 1 tablet (25 mg total) by mouth daily. -     buPROPion (WELLBUTRIN XL) 150 MG 24 hr tablet; Take 1 tablet (150 mg total) by mouth daily. -     busPIRone (BUSPAR) 5 MG tablet; Take 1 tablet (5 mg total) by mouth 3 (three) times daily. -     glucose blood test strip; Use as instructed -     Insulin Pen Needle (CAREFINE PEN NEEDLES) 32G X 6 MM MISC; Use as directed -     Lancets (ACCU-CHEK MULTICLIX) lancets; Use as instructed   I have discontinued Allison [name]'s FLUoxetine. I have also changed her NOVOLOG MIX 70/30 FLEXPEN to insulin aspart  protamine - aspart. Additionally, I am having her start on hydrochlorothiazide, buPROPion, and busPIRone. Lastly, I am having her maintain her metFORMIN, glucose blood, Insulin Pen Needle, and accu-chek multiclix.  Meds ordered this encounter  Medications  . insulin aspart protamine - aspart (NOVOLOG MIX 70/30 FLEXPEN) (70-30) 100 UNIT/ML FlexPen    Sig: INJECT 10 UNITS INTO THE SKIN 2 TIMES DAILY    Dispense:  7 pen    Refill:  1    Order Specific Question:  Supervising Provider    Answer:  Duncan Dull L [[id number]]  . hydrochlorothiazide (HYDRODIURIL) 25 MG tablet    Sig: Take 1 tablet (25 mg total) by mouth daily.    Dispense:  30 tablet    Refill:  0    Order Specific Question:  Supervising Provider    Answer:  Duncan Dull L [[id number]]  . buPROPion (WELLBUTRIN XL) 150 MG 24 hr tablet    Sig: Take 1 tablet (150 mg total) by mouth daily.    Dispense:  30 tablet    Refill:  0    Order Specific Question:  Supervising Provider    Answer:  Sherlene ShamsULLO, TERESA L [[id number]]  . busPIRone (BUSPAR) 5 MG tablet    Sig: Take 1 tablet (5 mg total) by mouth 3 (three) times daily.    Dispense:  90 tablet    Refill:  0    Order Specific Question:  Supervising Provider    Answer:  Duncan DullULLO, TERESA L [[id number]]  . glucose blood test strip    Sig: Use as instructed    Dispense:  50 each    Refill:  0    Accu-Chek (Aviva Plus) test strips.    Order Specific Question:  Supervising Provider    Answer:  Sherlene ShamsULLO, TERESA L [[id number]]  . Insulin Pen Needle (CAREFINE PEN NEEDLES) 32G X 6 MM MISC    Sig: Use as directed    Dispense:  50 each    Refill:  0    Order Specific Question:  Supervising Provider    Answer:  Duncan DullULLO, TERESA L [[id number]]  . Lancets (ACCU-CHEK MULTICLIX) lancets    Sig: Use as instructed    Dispense:  50 each    Refill:  0    Order Specific Question:  Supervising Provider    Answer:  Sherlene ShamsULLO, TERESA L [[id number]]     Follow-up: Return in about 4 weeks (around 06/12/2015) for Follow up on Medications .

## 2015-05-15 NOTE — Patient Instructions (Signed)
Health Maintenance, Female Adopting a healthy lifestyle and getting preventive care can go a long way to promote health and wellness. Talk with your health care provider about what schedule of regular examinations is right for you. This is a good chance for you to check in with your provider about disease prevention and staying healthy. In between checkups, there are plenty of things you can do on your own. Experts have done a lot of research about which lifestyle changes and preventive measures are most likely to keep you healthy. Ask your health care provider for more information. WEIGHT AND DIET  Eat a healthy diet  Be sure to include plenty of vegetables, fruits, low-fat dairy products, and lean protein.  Do not eat a lot of foods high in solid fats, added sugars, or salt.  Get regular exercise. This is one of the most important things you can do for your health.  Most adults should exercise for at least 150 minutes each week. The exercise should increase your heart rate and make you sweat (moderate-intensity exercise).  Most adults should also do strengthening exercises at least twice a week. This is in addition to the moderate-intensity exercise.  Maintain a healthy weight  Body mass index (BMI) is a measurement that can be used to identify possible weight problems. It estimates body fat based on height and weight. Your health care provider can help determine your BMI and help you achieve or maintain a healthy weight.  For females 20 years of age and older:   A BMI below 18.5 is considered underweight.  A BMI of 18.5 to 24.9 is normal.  A BMI of 25 to 29.9 is considered overweight.  A BMI of 30 and above is considered obese.  Watch levels of cholesterol and blood lipids  You should start having your blood tested for lipids and cholesterol at 30 years of age, then have this test every 5 years.  You may need to have your cholesterol levels checked more often if:  Your lipid  or cholesterol levels are high.  You are older than 30 years of age.  You are at high risk for heart disease.  CANCER SCREENING   Lung Cancer  Lung cancer screening is recommended for adults 55-80 years old who are at high risk for lung cancer because of a history of smoking.  A yearly low-dose CT scan of the lungs is recommended for people who:  Currently smoke.  Have quit within the past 15 years.  Have at least a 30-pack-year history of smoking. A pack year is smoking an average of one pack of cigarettes a day for 1 year.  Yearly screening should continue until it has been 15 years since you quit.  Yearly screening should stop if you develop a health problem that would prevent you from having lung cancer treatment.  Breast Cancer  Practice breast self-awareness. This means understanding how your breasts normally appear and feel.  It also means doing regular breast self-exams. Let your health care provider know about any changes, no matter how small.  If you are in your 20s or 30s, you should have a clinical breast exam (CBE) by a health care provider every 1-3 years as part of a regular health exam.  If you are 40 or older, have a CBE every year. Also consider having a breast X-ray (mammogram) every year.  If you have a family history of breast cancer, talk to your health care provider about genetic screening.  If you   are at high risk for breast cancer, talk to your health care provider about having an MRI and a mammogram every year.  Breast cancer gene (BRCA) assessment is recommended for women who have family members with BRCA-related cancers. BRCA-related cancers include:  Breast.  Ovarian.  Tubal.  Peritoneal cancers.  Results of the assessment will determine the need for genetic counseling and BRCA1 and BRCA2 testing. Cervical Cancer Your health care provider may recommend that you be screened regularly for cancer of the pelvic organs (ovaries, uterus, and  vagina). This screening involves a pelvic examination, including checking for microscopic changes to the surface of your cervix (Pap test). You may be encouraged to have this screening done every 3 years, beginning at age 21.  For women ages 30-65, health care providers may recommend pelvic exams and Pap testing every 3 years, or they may recommend the Pap and pelvic exam, combined with testing for human papilloma virus (HPV), every 5 years. Some types of HPV increase your risk of cervical cancer. Testing for HPV may also be done on women of any age with unclear Pap test results.  Other health care providers may not recommend any screening for nonpregnant women who are considered low risk for pelvic cancer and who do not have symptoms. Ask your health care provider if a screening pelvic exam is right for you.  If you have had past treatment for cervical cancer or a condition that could lead to cancer, you need Pap tests and screening for cancer for at least 20 years after your treatment. If Pap tests have been discontinued, your risk factors (such as having a new sexual partner) need to be reassessed to determine if screening should resume. Some women have medical problems that increase the chance of getting cervical cancer. In these cases, your health care provider may recommend more frequent screening and Pap tests. Colorectal Cancer  This type of cancer can be detected and often prevented.  Routine colorectal cancer screening usually begins at 30 years of age and continues through 30 years of age.  Your health care provider may recommend screening at an earlier age if you have risk factors for colon cancer.  Your health care provider may also recommend using home test kits to check for hidden blood in the stool.  A small camera at the end of a tube can be used to examine your colon directly (sigmoidoscopy or colonoscopy). This is done to check for the earliest forms of colorectal  cancer.  Routine screening usually begins at age 50.  Direct examination of the colon should be repeated every 5-10 years through 30 years of age. However, you may need to be screened more often if early forms of precancerous polyps or small growths are found. Skin Cancer  Check your skin from head to toe regularly.  Tell your health care provider about any new moles or changes in moles, especially if there is a change in a mole's shape or color.  Also tell your health care provider if you have a mole that is larger than the size of a pencil eraser.  Always use sunscreen. Apply sunscreen liberally and repeatedly throughout the day.  Protect yourself by wearing long sleeves, pants, a wide-brimmed hat, and sunglasses whenever you are outside. HEART DISEASE, DIABETES, AND HIGH BLOOD PRESSURE   High blood pressure causes heart disease and increases the risk of stroke. High blood pressure is more likely to develop in:  People who have blood pressure in the high end   of the normal range (130-139/85-89 mm Hg).  People who are overweight or obese.  People who are African American.  If you are 38-23 years of age, have your blood pressure checked every 3-5 years. If you are 61 years of age or older, have your blood pressure checked every year. You should have your blood pressure measured twice--once when you are at a hospital or clinic, and once when you are not at a hospital or clinic. Record the average of the two measurements. To check your blood pressure when you are not at a hospital or clinic, you can use:  An automated blood pressure machine at a pharmacy.  A home blood pressure monitor.  If you are between 45 years and 39 years old, ask your health care provider if you should take aspirin to prevent strokes.  Have regular diabetes screenings. This involves taking a blood sample to check your fasting blood sugar level.  If you are at a normal weight and have a low risk for diabetes,  have this test once every three years after 30 years of age.  If you are overweight and have a high risk for diabetes, consider being tested at a younger age or more often. PREVENTING INFECTION  Hepatitis B  If you have a higher risk for hepatitis B, you should be screened for this virus. You are considered at high risk for hepatitis B if:  You were born in a country where hepatitis B is common. Ask your health care provider which countries are considered high risk.  Your parents were born in a high-risk country, and you have not been immunized against hepatitis B (hepatitis B vaccine).  You have HIV or AIDS.  You use needles to inject street drugs.  You live with someone who has hepatitis B.  You have had sex with someone who has hepatitis B.  You get hemodialysis treatment.  You take certain medicines for conditions, including cancer, organ transplantation, and autoimmune conditions. Hepatitis C  Blood testing is recommended for:  Everyone born from [birth year] through 1965.  Anyone with known risk factors for hepatitis C. Sexually transmitted infections (STIs)  You should be screened for sexually transmitted infections (STIs) including gonorrhea and chlamydia if:  You are sexually active and are younger than 30 years of age.  You are older than 30 years of age and your health care provider tells you that you are at risk for this type of infection.  Your sexual activity has changed since you were last screened and you are at an increased risk for chlamydia or gonorrhea. Ask your health care provider if you are at risk.  If you do not have HIV, but are at risk, it may be recommended that you take a prescription medicine daily to prevent HIV infection. This is called pre-exposure prophylaxis (PrEP). You are considered at risk if:  You are sexually active and do not regularly use condoms or know the HIV status of your partner(s).  You take drugs by injection.  You are sexually  active with a partner who has HIV. Talk with your health care provider about whether you are at high risk of being infected with HIV. If you choose to begin PrEP, you should first be tested for HIV. You should then be tested every 3 months for as long as you are taking PrEP.  PREGNANCY   If you are premenopausal and you may become pregnant, ask your health care provider about preconception counseling.  If you may  become pregnant, take 400 to 800 micrograms (mcg) of folic acid every day.  If you want to prevent pregnancy, talk to your health care provider about birth control (contraception). OSTEOPOROSIS AND MENOPAUSE   Osteoporosis is a disease in which the bones lose minerals and strength with aging. This can result in serious bone fractures. Your risk for osteoporosis can be identified using a bone density scan.  If you are 61 years of age or older, or if you are at risk for osteoporosis and fractures, ask your health care provider if you should be screened.  Ask your health care provider whether you should take a calcium or vitamin D supplement to lower your risk for osteoporosis.  Menopause may have certain physical symptoms and risks.  Hormone replacement therapy may reduce some of these symptoms and risks. Talk to your health care provider about whether hormone replacement therapy is right for you.  HOME CARE INSTRUCTIONS   Schedule regular health, dental, and eye exams.  Stay current with your immunizations.   Do not use any tobacco products including cigarettes, chewing tobacco, or electronic cigarettes.  If you are pregnant, do not drink alcohol.  If you are breastfeeding, limit how much and how often you drink alcohol.  Limit alcohol intake to no more than 1 drink per day for nonpregnant women. One drink equals 12 ounces of beer, 5 ounces of wine, or 1 ounces of hard liquor.  Do not use street drugs.  Do not share needles.  Ask your health care provider for help if  you need support or information about quitting drugs.  Tell your health care provider if you often feel depressed.  Tell your health care provider if you have ever been abused or do not feel safe at home.   This information is not intended to replace advice given to you by your health care provider. Make sure you discuss any questions you have with your health care provider.   Document Released: 11/11/2010 Document Revised: 05/19/2014 Document Reviewed: 03/30/2013 Elsevier Interactive Patient Education [employer].

## 2015-05-18 LAB — CYTOLOGY - PAP

## 2015-05-20 ENCOUNTER — Encounter: Payer: Self-pay | Admitting: Nurse Practitioner

## 2015-05-20 DIAGNOSIS — Z01419 Encounter for gynecological examination (general) (routine) without abnormal findings: Secondary | ICD-10-CM | POA: Insufficient documentation

## 2015-05-20 DIAGNOSIS — I10 Essential (primary) hypertension: Secondary | ICD-10-CM | POA: Insufficient documentation

## 2015-05-20 MED ORDER — METFORMIN HCL 1000 MG PO TABS: ORAL_TABLET | ORAL | Status: DC

## 2015-05-20 NOTE — Assessment & Plan Note (Signed)
[facility], no longer on crestor. Will follow Encouraged healthy diet and exercise

## 2015-05-20 NOTE — Assessment & Plan Note (Addendum)
BP Readings from Last 3 Encounters:  05/15/15 164/102  06/22/14 122/80  12/05/13 134/87   Uncontrolled. Placed on HCTZ 25 mg daily. Asked her to take in the morning. FU in 4 weeks, pt getting labs through work  Found through screening at physical

## 2015-05-20 NOTE — Assessment & Plan Note (Addendum)
Pt stopped working at job due to back lash from what happened (see previous note). She is sad and stressed. She got her job back and is learning to deal with the back lash. After discussion of what to do we settled on Wellbutrin XL, Buspar 5 mg prn anxiety and FU in 4 weeks.   PHQ-9 positive and part of annual exam

## 2015-05-20 NOTE — Assessment & Plan Note (Signed)
Discussed acute and chronic issues. Reviewed health maintenance measures, PFSHx, and immunizations. Obtain routine labs TSH, Lipid panel, CBC w/ diff, A1c, and CMET (via [facility])

## 2015-05-20 NOTE — Assessment & Plan Note (Signed)
Reflled test strips, lancets, needles, metformin, novolog flexpen. Need A1c and urine microalbumin checked at [facility]. Letter sent via patient to get labs Pt stopped job --> ran out of insurance --> stopped the meds --> got job back and now we are getting back on track.

## 2015-05-20 NOTE — Assessment & Plan Note (Addendum)
Ready to quit at this time. Will try Wellbutrin for smoking cessation

## 2015-05-20 NOTE — Assessment & Plan Note (Signed)
PAP and clinical breast exam completed today without significant findings

## 2015-06-05 ENCOUNTER — Encounter: Payer: Self-pay | Admitting: Family Medicine

## 2015-06-05 ENCOUNTER — Ambulatory Visit (INDEPENDENT_AMBULATORY_CARE_PROVIDER_SITE_OTHER): Payer: BLUE CROSS/BLUE SHIELD | Admitting: Family Medicine

## 2015-06-05 VITALS — BP 126/94 | HR 116 | Temp 98.4°F | Ht 62.5 in | Wt 216.0 lb

## 2015-06-05 DIAGNOSIS — R21 Rash and other nonspecific skin eruption: Secondary | ICD-10-CM | POA: Insufficient documentation

## 2015-06-05 MED ORDER — METHYLPREDNISOLONE ACETATE 80 MG/ML IJ SUSP
80.0000 mg | Freq: Once | INTRAMUSCULAR | Status: AC
  Administered 2015-06-05: 80 mg via INTRAMUSCULAR

## 2015-06-05 NOTE — Progress Notes (Signed)
Pre visit review using our clinic review tool, if applicable. No additional management support is needed unless otherwise documented below in the visit note. 

## 2015-06-05 NOTE — Assessment & Plan Note (Addendum)
New problem. I suspect that this is allergic in nature. Rash is a known adverse effect of Wellbutrin. Will stop. I discussed tapering versus abrupt cessation with pharmacy at [facility] and they state that stopping abruptly should be okay especially given that she recent start of this. Patient was given an IM injection of Depo-Medrol today.  Advised Benadryl and second generation antihistamines. Patient to follow-up in the next few days if she fails to improve or worsens.

## 2015-06-05 NOTE — Patient Instructions (Signed)
Take Benadryl at night.  Use claritin/zyrtec/or allegra during the day.  Stop the Wellbutrin.  Let me know if you fail to improve in the next 2 days.  Take care  Dr. Adriana Simas

## 2015-06-05 NOTE — Progress Notes (Signed)
Subjective:  Patient ID: Allison [name], female    DOB: 1985/10/22  Age: 30 y.o. MRN: [id number]  CC: Rash  HPI:  30 year old female presents for an acute visit with complaints of rash.  Rash  Patient states that she's had a rash for the past 2 days.  She states that the rash has been diffuse. She is currently having trouble with her hands/forearms/buttocks/feet.  She states that the rash is intensely itchy.  She denies any fever or chills. No recent illness.  No oral/mucosal involvement.  She does report that she was recently started on 2 medications: BuSpar and Wellbutrin.  No other known new exposures.  No medications or interventions tried. Rash seems to be exacerbated by scratching.  Social Hx   Social History   Social History  . Marital Status: Single    Spouse Name: Richard  . Number of Children: 2  . Years of Education: 14   Occupational History  . Physical Department/Environmental Services [employer]   Social History Main Topics  . Smoking status: Current Every Day Smoker -- 0.50 packs/day    Types: Cigarettes    Start date: 05/14/2006  . Smokeless tobacco: Never Used  . Alcohol Use: 0.0 oz/week    0 Standard drinks or equivalent per week     Comment: Rare   . Drug Use: No  . Sexual Activity: Yes   Other Topics Concern  . None   Social History Narrative   [name] grew up in [place], [place]. She is married and has 2 children. She enjoys spending time with her family. She enjoys reading and shopping.       Smokes < 10 cigarettes daily - trying to quit   Caffeine - drinks 3 cans of diet pepsi daily   Alcohol - rare   Review of Systems  Constitutional: Negative.   Skin: Positive for rash.   Objective:  BP 126/94 mmHg  Pulse 116  Temp(Src) 98.4 F (36.9 C) (Oral)  Ht 5' 2.5" (1.588 m)  Wt 216 lb (97.977 kg)  BMI 38.85 kg/m2  SpO2 97%  LMP 05/03/2015 (Approximate)  BP/Weight 06/05/2015 05/15/2015 06/22/2014  Systolic BP 126 164 122    Diastolic BP 94 102 80  Wt. (Lbs) 216 221 195  BMI 38.85 40.41 35.08   Physical Exam  Constitutional: She is oriented to person, place, and time. She appears well-developed. No distress.  HENT:  Head: Normocephalic and atraumatic.  Mouth/Throat: Oropharynx is clear and moist. No oropharyngeal exudate.  Pulmonary/Chest: Effort normal.  Neurological: She is alert and oriented to person, place, and time.  Skin:  Left forearm with 2 raised erythematous areas. Forearm and hand swelling noted. Similar area noted in the gluteal fold. Erythematous area on the plantar aspect of the right foot.  Psychiatric: She has a normal mood and affect.  Vitals reviewed.     Lab Results  Component Value Date   WBC 9.0 07/24/2014   HGB 13.7 07/24/2014   HCT 42 07/24/2014   PLT 303 07/24/2014   GLUCOSE 109* 03/30/2012   CHOL 161 07/24/2014   TRIG 202* 07/24/2014   HDL 31* 07/24/2014   LDLCALC 90 07/24/2014   ALT 14 07/24/2014   AST 9* 07/24/2014   NA 141 07/24/2014   K 4.6 07/24/2014   CL 106 01/24/2012   CREATININE 0.8 07/24/2014   BUN 13 07/24/2014   CO2 24 01/24/2012   TSH 1.02 07/24/2014   HGBA1C 5.6 07/24/2014    Assessment & Plan:  Problem List Items Addressed This Visit    Rash - Primary    New problem. I suspect that this is allergic in nature. Rash is a known adverse effect of Wellbutrin. Will stop. I discussed tapering versus abrupt cessation with pharmacy at [facility] and they state that stopping abruptly should be okay especially given that she recent start of this. Patient was given an IM injection of Depo-Medrol today.  Advised Benadryl and second generation antihistamines. Patient to follow-up in the next few days if she fails to improve or worsens.      Relevant Medications   methylPREDNISolone acetate (DEPO-MEDROL) injection 80 mg (Completed)     Follow-up: PRN  Everlene Other DO [facility]

## 2015-06-12 ENCOUNTER — Ambulatory Visit: Payer: BLUE CROSS/BLUE SHIELD | Admitting: Nurse Practitioner

## 2015-06-14 ENCOUNTER — Ambulatory Visit (INDEPENDENT_AMBULATORY_CARE_PROVIDER_SITE_OTHER): Payer: BLUE CROSS/BLUE SHIELD | Admitting: Nurse Practitioner

## 2015-06-14 ENCOUNTER — Encounter: Payer: Self-pay | Admitting: Nurse Practitioner

## 2015-06-14 VITALS — BP 146/94 | HR 96 | Temp 98.0°F | Resp 16 | Ht 62.0 in | Wt 213.5 lb

## 2015-06-14 DIAGNOSIS — I10 Essential (primary) hypertension: Secondary | ICD-10-CM | POA: Diagnosis not present

## 2015-06-14 DIAGNOSIS — F4310 Post-traumatic stress disorder, unspecified: Secondary | ICD-10-CM | POA: Diagnosis not present

## 2015-06-14 DIAGNOSIS — R21 Rash and other nonspecific skin eruption: Secondary | ICD-10-CM

## 2015-06-14 MED ORDER — BUPROPION HCL ER (XL) 150 MG PO TB24: 150.0000 mg | ORAL_TABLET | Freq: Every day | ORAL | Status: DC

## 2015-06-14 MED ORDER — BUSPIRONE HCL 5 MG PO TABS
5.0000 mg | ORAL_TABLET | Freq: Three times a day (TID) | ORAL | Status: DC
Start: 2015-06-14 — End: 2015-08-13

## 2015-06-14 NOTE — Progress Notes (Signed)
Patient ID: Allison [name], female    DOB: Sep 20, 1985  Age: 30 y.o. MRN: [id number]  CC: Follow-up   HPI Allison [name] presents for medication follow-up.  1) Started to have a rash on her stomach She saw Dr. Adriana Simas on 06/05/2015 He advised her to stop the Wellbutrin Prednisone shot was helpful about 24 hours later for patient with the rash completely resolving   After stopping the Wellbutrin for a week she felt like she felt more depressed again and wanted to start back on the Wellbutrin because she noticed a big difference. After starting back on the Wellbutrin she has been taking Zyrtec daily and has had no rashes since.   History Allison [name] has a past medical history of Diabetes mellitus without complication (HCC).   She has past surgical history that includes Cholecystectomy (2003).   Her family history includes Cancer (age of onset: 33) in her mother; Diabetes in her maternal grandfather, mother, paternal aunt, and paternal aunt; Multiple sclerosis (age of onset: 75) in her father.She reports that she has been smoking Cigarettes.  She started smoking about 9 years ago. She has been smoking about 0.50 packs per day. She has never used smokeless tobacco. She reports that she drinks alcohol. She reports that she does not use illicit drugs.  Outpatient Prescriptions Prior to Visit  Medication Sig Dispense Refill  . glucose blood test strip Use as instructed 50 each 0  . hydrochlorothiazide (HYDRODIURIL) 25 MG tablet Take 1 tablet (25 mg total) by mouth daily. 30 tablet 0  . insulin aspart protamine - aspart (NOVOLOG MIX 70/30 FLEXPEN) (70-30) 100 UNIT/ML FlexPen INJECT 10 UNITS INTO THE SKIN 2 TIMES DAILY (Patient taking differently: Inject 16 Units into the skin 2 (two) times daily with a meal. Inject 16 units into the skin twice daily with meals) 7 pen 1  . Insulin Pen Needle (CAREFINE PEN NEEDLES) 32G X 6 MM MISC Use as directed 50 each 0  . Lancets (ACCU-CHEK MULTICLIX) lancets  Use as instructed 50 each 0  . metFORMIN (GLUCOPHAGE) 1000 MG tablet TAKE ONE TABLET BY MOUTH TWICE DAILY WITH A MEAL 60 tablet 5  . buPROPion (WELLBUTRIN XL) 150 MG 24 hr tablet Take 1 tablet (150 mg total) by mouth daily. 30 tablet 0  . busPIRone (BUSPAR) 5 MG tablet Take 1 tablet (5 mg total) by mouth 3 (three) times daily. 90 tablet 0   No facility-administered medications prior to visit.    ROS Review of Systems  Constitutional: Negative for fever, chills, diaphoresis and fatigue.  HENT: Negative for trouble swallowing.   Respiratory: Negative for chest tightness, shortness of breath and wheezing.   Cardiovascular: Negative for chest pain, palpitations and leg swelling.  Gastrointestinal: Negative for nausea, vomiting and diarrhea.  Skin: Negative for rash.  Neurological: Negative for dizziness, weakness, numbness and headaches.  Psychiatric/Behavioral: The patient is not nervous/anxious.     Objective:  BP 146/94 mmHg  Pulse 96  Temp(Src) 98 F (36.7 C) (Oral)  Resp 16  Ht  (1.575 m)  Wt 213 lb 8 oz (96.843 kg)  BMI 39.04 kg/m2  SpO2 99%  LMP 05/03/2015 (Approximate)  Physical Exam  Constitutional: She is oriented to person, place, and time. She appears well-developed and well-nourished. No distress.  HENT:  Head: Normocephalic and atraumatic.  Right Ear: External ear normal.  Left Ear: External ear normal.  Tongue nor oropharynx are edematous  Cardiovascular: Normal rate, regular rhythm and normal heart sounds.  Exam reveals  no gallop and no friction rub.   No murmur heard. Pulmonary/Chest: Effort normal and breath sounds normal. No respiratory distress. She has no wheezes. She has no rales. She exhibits no tenderness.  Neurological: She is alert and oriented to person, place, and time. No cranial nerve deficit. She exhibits normal muscle tone. Coordination normal.  Skin: Skin is warm and dry. No rash noted. She is not diaphoretic.  Psychiatric: She has a  normal mood and affect. Her behavior is normal. Judgment and thought content normal.    Assessment & Plan:   Allison [name] was seen today for follow-up.  Diagnoses and all orders for this visit:  Rash  Benign essential HTN  PTSD (post-traumatic stress disorder)  Other orders -     buPROPion (WELLBUTRIN XL) 150 MG 24 hr tablet; Take 1 tablet (150 mg total) by mouth daily. -     busPIRone (BUSPAR) 5 MG tablet; Take 1 tablet (5 mg total) by mouth 3 (three) times daily.  I am having Allison [name] maintain her insulin aspart protamine - aspart, hydrochlorothiazide, glucose blood, Insulin Pen Needle, accu-chek multiclix, metFORMIN, cetirizine, buPROPion, and busPIRone.  Meds ordered this encounter  Medications  . cetirizine (ZYRTEC) 10 MG tablet    Sig: Take 10 mg by mouth daily as needed for allergies.  Marland Kitchen buPROPion (WELLBUTRIN XL) 150 MG 24 hr tablet    Sig: Take 1 tablet (150 mg total) by mouth daily.    Dispense:  30 tablet    Refill:  0    Order Specific Question:  Supervising Provider    Answer:  Duncan Dull L [[id number]]  . busPIRone (BUSPAR) 5 MG tablet    Sig: Take 1 tablet (5 mg total) by mouth 3 (three) times daily.    Dispense:  90 tablet    Refill:  0    Order Specific Question:  Supervising Provider    Answer:  Sherlene Shams [[id number]]     Follow-up: Return in about 2 months (around 08/12/2015) for Medication follow up.

## 2015-06-14 NOTE — Progress Notes (Signed)
Pre visit review using our clinic review tool, if applicable. No additional management support is needed unless otherwise documented below in the visit note. 

## 2015-06-14 NOTE — Patient Instructions (Signed)
Follow up in 2 months- if you need a refill on medication in the mean time please call your pharmacy.   Keep up the great work!

## 2015-06-19 NOTE — Assessment & Plan Note (Signed)
Resolved with prednisone Has been taking medication again for 1 week successfully She is also taking Zyrtec at this time which may be helping

## 2015-06-19 NOTE — Assessment & Plan Note (Signed)
Encouraged working on weight loss and exercise She has started back to the gym and reports this is helpful Follow-up in 2 months

## 2015-06-19 NOTE — Assessment & Plan Note (Signed)
Doing well on Wellbutrin She would love to continue this medication and is taking Zyrtec concurrently without concern

## 2015-07-03 ENCOUNTER — Other Ambulatory Visit: Payer: Self-pay | Admitting: Internal Medicine

## 2015-08-13 ENCOUNTER — Encounter: Payer: Self-pay | Admitting: Nurse Practitioner

## 2015-08-13 ENCOUNTER — Ambulatory Visit (INDEPENDENT_AMBULATORY_CARE_PROVIDER_SITE_OTHER): Payer: BLUE CROSS/BLUE SHIELD | Admitting: Nurse Practitioner

## 2015-08-13 VITALS — BP 156/102 | HR 86 | Temp 98.1°F | Ht 62.0 in | Wt 213.5 lb

## 2015-08-13 DIAGNOSIS — I10 Essential (primary) hypertension: Secondary | ICD-10-CM | POA: Diagnosis not present

## 2015-08-13 DIAGNOSIS — IMO0001 Reserved for inherently not codable concepts without codable children: Secondary | ICD-10-CM

## 2015-08-13 DIAGNOSIS — E1165 Type 2 diabetes mellitus with hyperglycemia: Secondary | ICD-10-CM | POA: Diagnosis not present

## 2015-08-13 DIAGNOSIS — E669 Obesity, unspecified: Secondary | ICD-10-CM

## 2015-08-13 DIAGNOSIS — Z716 Tobacco abuse counseling: Secondary | ICD-10-CM | POA: Diagnosis not present

## 2015-08-13 MED ORDER — BUPROPION HCL ER (XL) 150 MG PO TB24: 150.0000 mg | ORAL_TABLET | Freq: Every day | ORAL | Status: DC

## 2015-08-13 MED ORDER — BUPROPION HCL ER (XL) 300 MG PO TB24: 300.0000 mg | ORAL_TABLET | Freq: Every day | ORAL | Status: DC

## 2015-08-13 MED ORDER — HYDROCHLOROTHIAZIDE 25 MG PO TABS: 25.0000 mg | ORAL_TABLET | Freq: Every day | ORAL | Status: DC

## 2015-08-13 NOTE — Progress Notes (Signed)
Patient ID: Allison [name], female    DOB: 11-12-1985  Age: 30 y.o. MRN: [id number]  CC: No chief complaint on file.   HPI Allison [name] presents for follow up of HTN, Tobacco use, obesity.   1) HTN- stopped taking HCTZ after refill ran out  Having HA   2) Wellbutrin and Buspar- stopped taking as well  Denies side effects  Feels like she didn't need it anymore   3) Exercise- 30-45 minutes 3-4 x wkly at the gym  Food- healthy diet, cut out fried foods  4) Tobacco- Wellbutrin not helpful  [name] at [name] and hypnosis  Nicorette gum   History Allison [name] has a past medical history of Diabetes mellitus without complication (HCC).   She has past surgical history that includes Cholecystectomy (2003).   Her family history includes Cancer (age of onset: 70) in her mother; Diabetes in her maternal grandfather, mother, paternal aunt, and paternal aunt; Multiple sclerosis (age of onset: 62) in her father.She reports that she has been smoking Cigarettes.  She started smoking about 9 years ago. She has been smoking about 0.50 packs per day. She has never used smokeless tobacco. She reports that she drinks alcohol. She reports that she does not use illicit drugs.  Outpatient Prescriptions Prior to Visit  Medication Sig Dispense Refill  . cetirizine (ZYRTEC) 10 MG tablet Take 10 mg by mouth daily as needed for allergies.    Marland Kitchen glucose blood test strip Use as instructed 50 each 0  . Insulin Pen Needle (CAREFINE PEN NEEDLES) 32G X 6 MM MISC Use as directed 50 each 0  . Lancets (ACCU-CHEK MULTICLIX) lancets Use as instructed 50 each 0  . metFORMIN (GLUCOPHAGE) 1000 MG tablet TAKE ONE TABLET BY MOUTH TWICE DAILY WITH A MEAL 60 tablet 5  . NOVOLOG MIX 70/30 FLEXPEN (70-30) 100 UNIT/ML FlexPen INJECT 10 UNITS SUBCUTANEOUSLY 2 TIMES DAILY --Allison [name] 7 pen 5  . hydrochlorothiazide (HYDRODIURIL) 25 MG tablet Take 1 tablet (25 mg total) by mouth daily. 30 tablet 0  . buPROPion  (WELLBUTRIN XL) 150 MG 24 hr tablet Take 1 tablet (150 mg total) by mouth daily. (Patient not taking: Reported on 08/13/2015) 30 tablet 0  . busPIRone (BUSPAR) 5 MG tablet Take 1 tablet (5 mg total) by mouth 3 (three) times daily. (Patient not taking: Reported on 08/13/2015) 90 tablet 0   No facility-administered medications prior to visit.    ROS Review of Systems  Constitutional: Negative for fever, chills, diaphoresis and fatigue.  Respiratory: Negative for chest tightness, shortness of breath and wheezing.   Cardiovascular: Negative for chest pain, palpitations and leg swelling.  Gastrointestinal: Negative for nausea, vomiting and diarrhea.  Skin: Negative for rash.  Neurological: Negative for dizziness, weakness, numbness and headaches.  Psychiatric/Behavioral: The patient is not nervous/anxious.     Objective:  BP 156/102 mmHg  Pulse 86  Temp(Src) 98.1 F (36.7 C) (Oral)  Ht  (1.575 m)  Wt 213 lb 8 oz (96.843 kg)  BMI 39.04 kg/m2  SpO2 99%  Physical Exam  Constitutional: She is oriented to person, place, and time. She appears well-developed and well-nourished. No distress.  HENT:  Head: Normocephalic and atraumatic.  Right Ear: External ear normal.  Left Ear: External ear normal.  Cardiovascular: Normal rate, regular rhythm and normal heart sounds.   Pulmonary/Chest: Effort normal and breath sounds normal. No respiratory distress. She has no wheezes. She has no rales. She exhibits no tenderness.  Neurological: She is alert and  oriented to person, place, and time. No cranial nerve deficit. She exhibits normal muscle tone. Coordination normal.  Skin: Skin is warm and dry. No rash noted. She is not diaphoretic.  Psychiatric: She has a normal mood and affect. Her behavior is normal. Judgment and thought content normal.   Assessment & Plan:   Diagnoses and all orders for this visit:  Uncontrolled type 2 diabetes mellitus without complication, without long-term current use  of insulin (HCC)  Tobacco abuse counseling  Benign essential HTN  Obesity  Other orders -     hydrochlorothiazide (HYDRODIURIL) 25 MG tablet; Take 1 tablet (25 mg total) by mouth daily. -     buPROPion (WELLBUTRIN XL) 150 MG 24 hr tablet; Take 1 tablet (150 mg total) by mouth daily. -     buPROPion (WELLBUTRIN XL) 300 MG 24 hr tablet; Take 1 tablet (300 mg total) by mouth daily.  I have discontinued Allison [name]'s busPIRone. I am also having her start on buPROPion. Additionally, I am having her maintain her glucose blood, Insulin Pen Needle, accu-chek multiclix, metFORMIN, cetirizine, NOVOLOG MIX 70/30 FLEXPEN, hydrochlorothiazide, and buPROPion.  Meds ordered this encounter  Medications  . hydrochlorothiazide (HYDRODIURIL) 25 MG tablet    Sig: Take 1 tablet (25 mg total) by mouth daily.    Dispense:  30 tablet    Refill:  0  . buPROPion (WELLBUTRIN XL) 150 MG 24 hr tablet    Sig: Take 1 tablet (150 mg total) by mouth daily.    Dispense:  30 tablet    Refill:  0    Current month of April dosage (then going to 300 mg)    Order Specific Question:  Supervising Provider    Answer:  Allison [name] [[id number]]  . buPROPion (WELLBUTRIN XL) 300 MG 24 hr tablet    Sig: Take 1 tablet (300 mg total) by mouth daily.    Dispense:  30 tablet    Refill:  0    Fill on or after May 3rd    Order Specific Question:  Supervising Provider    Answer:  Allison [name] [[id number]]     Follow-up: Return in about 2 months (around 10/13/2015) for Follow up.

## 2015-08-13 NOTE — Patient Instructions (Signed)
Take the Wellbutrin 150 again once daily for 30 days then switch to 300 mg once daily.   - May help weight loss and smoking efforts  HCTZ- filled for your Blood pressure.   See you in 2 months.

## 2015-08-14 DIAGNOSIS — IMO0001 Reserved for inherently not codable concepts without codable children: Secondary | ICD-10-CM | POA: Insufficient documentation

## 2015-08-14 NOTE — Assessment & Plan Note (Signed)
L[facility] have not been drawn at [facility] Re printed and gave her the copy of the letter to have labs drawn for free at [facility]

## 2015-08-14 NOTE — Assessment & Plan Note (Signed)
Uncontrolled. Stopped HCTZ Called back into pharmacy to restart FU in 2 months- labs being obtained by [facility]- (k+)

## 2015-08-14 NOTE — Assessment & Plan Note (Signed)
Starting back on Wellbutrin  Pt is working out - congratulated her on this effort  Healthy eating habits reinforced

## 2015-08-14 NOTE — Assessment & Plan Note (Signed)
Interested in quitting Wanting to try the Wellbutrin once again and get to higher level 1 month of 150 mg XL sent to pharmacy for April 2nd month switch to 300 mg for May and follow up then

## 2015-08-17 LAB — TSH: TSH: 1.14 u[IU]/mL (ref 0.41–5.90)

## 2015-08-17 LAB — HEPATIC FUNCTION PANEL
ALT: 15 U/L (ref 7–35)
AST: 14 U/L (ref 13–35)
Alkaline Phosphatase: 58 U/L (ref 25–125)
Bilirubin, Total: 0.2 mg/dL

## 2015-08-17 LAB — CBC AND DIFFERENTIAL
HCT: 42 % (ref 36–46)
HEMOGLOBIN: 13.9 g/dL (ref 12.0–16.0)
Neutrophils Absolute: 6 /uL
PLATELETS: 354 10*3/uL (ref 150–399)
WBC: 10.7 10^3/mL

## 2015-08-17 LAB — LIPID PANEL
CHOLESTEROL: 164 mg/dL (ref 0–200)
HDL: 25 mg/dL — AB (ref 35–70)
TRIGLYCERIDES: 442 mg/dL — AB (ref 40–160)

## 2015-08-17 LAB — BASIC METABOLIC PANEL
BUN: 20 mg/dL (ref 4–21)
CREATININE: 0.8 mg/dL (ref 0.5–1.1)
Glucose: 103 mg/dL
POTASSIUM: 4 mmol/L (ref 3.4–5.3)
Sodium: 138 mmol/L (ref 137–147)

## 2015-08-17 LAB — HEMOGLOBIN A1C: HEMOGLOBIN A1C: 6.1

## 2015-08-20 ENCOUNTER — Encounter: Payer: Self-pay | Admitting: Nurse Practitioner

## 2015-08-21 ENCOUNTER — Other Ambulatory Visit: Payer: Self-pay | Admitting: Nurse Practitioner

## 2015-08-21 MED ORDER — FENOFIBRATE 40 MG PO TABS: 1.0000 | ORAL_TABLET | Freq: Every day | ORAL | Status: DC

## 2015-09-15 ENCOUNTER — Other Ambulatory Visit: Payer: Self-pay | Admitting: Nurse Practitioner

## 2015-10-01 DIAGNOSIS — L650 Telogen effluvium: Secondary | ICD-10-CM | POA: Diagnosis not present

## 2015-10-01 DIAGNOSIS — L700 Acne vulgaris: Secondary | ICD-10-CM | POA: Diagnosis not present

## 2015-10-01 DIAGNOSIS — Z79899 Other long term (current) drug therapy: Secondary | ICD-10-CM | POA: Diagnosis not present

## 2015-10-15 ENCOUNTER — Ambulatory Visit: Payer: BLUE CROSS/BLUE SHIELD | Admitting: Nurse Practitioner

## 2015-10-26 ENCOUNTER — Encounter: Payer: Self-pay | Admitting: Family Medicine

## 2015-10-26 ENCOUNTER — Ambulatory Visit (INDEPENDENT_AMBULATORY_CARE_PROVIDER_SITE_OTHER): Payer: BLUE CROSS/BLUE SHIELD | Admitting: Family Medicine

## 2015-10-26 VITALS — BP 112/84 | HR 105 | Temp 98.3°F | Ht 62.0 in | Wt 206.0 lb

## 2015-10-26 DIAGNOSIS — E785 Hyperlipidemia, unspecified: Secondary | ICD-10-CM

## 2015-10-26 DIAGNOSIS — I10 Essential (primary) hypertension: Secondary | ICD-10-CM

## 2015-10-26 DIAGNOSIS — F4310 Post-traumatic stress disorder, unspecified: Secondary | ICD-10-CM | POA: Diagnosis not present

## 2015-10-26 DIAGNOSIS — IMO0001 Reserved for inherently not codable concepts without codable children: Secondary | ICD-10-CM

## 2015-10-26 DIAGNOSIS — E1165 Type 2 diabetes mellitus with hyperglycemia: Secondary | ICD-10-CM

## 2015-10-26 MED ORDER — BUPROPION HCL ER (XL) 300 MG PO TB24: 300.0000 mg | ORAL_TABLET | Freq: Every day | ORAL | Status: DC

## 2015-10-26 NOTE — Patient Instructions (Signed)
Continue your meds.  Call or let me know if you want to (or need to) do anything else about your mood/current situation.  Follow up in 6 months  Take care  Dr. Adriana Simasook

## 2015-10-26 NOTE — Assessment & Plan Note (Signed)
Well controlled. Continue current regimen of NovoLog 70/30 and metformin.

## 2015-10-26 NOTE — Progress Notes (Signed)
Pre visit review using our clinic review tool, if applicable. No additional management support is needed unless otherwise documented below in the visit note. 

## 2015-10-26 NOTE — Assessment & Plan Note (Signed)
Well-controlled. Will continue HCTZ at this time as she has been stable on it. Consider switch to lisinopril in the future given DM 2.
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
## 2015-10-26 NOTE — Assessment & Plan Note (Signed)
Stable. Continue Fibrate. Consider statin.
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
## 2015-12-03 DIAGNOSIS — L700 Acne vulgaris: Secondary | ICD-10-CM | POA: Diagnosis not present

## 2015-12-03 DIAGNOSIS — L650 Telogen effluvium: Secondary | ICD-10-CM | POA: Diagnosis not present

## 2015-12-03 DIAGNOSIS — Z79899 Other long term (current) drug therapy: Secondary | ICD-10-CM | POA: Diagnosis not present

## 2015-12-20 ENCOUNTER — Other Ambulatory Visit: Payer: Self-pay | Admitting: Nurse Practitioner

## 2015-12-23 ENCOUNTER — Encounter: Payer: Self-pay | Admitting: Family Medicine

## 2015-12-25 ENCOUNTER — Ambulatory Visit: Payer: BLUE CROSS/BLUE SHIELD | Admitting: Family Medicine

## 2016-01-16 ENCOUNTER — Encounter: Payer: Self-pay | Admitting: Family Medicine

## 2016-01-16 ENCOUNTER — Ambulatory Visit (INDEPENDENT_AMBULATORY_CARE_PROVIDER_SITE_OTHER): Payer: BLUE CROSS/BLUE SHIELD | Admitting: Family Medicine

## 2016-01-16 VITALS — BP 140/96 | HR 101 | Temp 98.3°F | Wt 212.4 lb

## 2016-01-16 DIAGNOSIS — IMO0001 Reserved for inherently not codable concepts without codable children: Secondary | ICD-10-CM

## 2016-01-16 DIAGNOSIS — Z794 Long term (current) use of insulin: Secondary | ICD-10-CM

## 2016-01-16 DIAGNOSIS — I10 Essential (primary) hypertension: Secondary | ICD-10-CM | POA: Diagnosis not present

## 2016-01-16 DIAGNOSIS — E119 Type 2 diabetes mellitus without complications: Secondary | ICD-10-CM | POA: Diagnosis not present

## 2016-01-16 DIAGNOSIS — E785 Hyperlipidemia, unspecified: Secondary | ICD-10-CM | POA: Diagnosis not present

## 2016-01-16 MED ORDER — LORCASERIN HCL ER 20 MG PO TB24: 1.0000 | ORAL_TABLET | Freq: Every day | ORAL | 0 refills | Status: DC

## 2016-01-16 NOTE — Patient Instructions (Signed)
Take the medication daily.  Follow up in 3 months.  Take care  Dr. Adriana Simasook

## 2016-01-16 NOTE — Progress Notes (Signed)
Subjective:  Patient ID: Allison [name], female    DOB: Jan 21, 1986  Age: 30 y.o. MRN: [id number]  CC: Discuss weight loss medication  HPI:  30 year old female with DM-2, HLD, HTN presents to discuss weight loss medication.  Patient presents today to discuss weight loss medication. Patient states that she has been exercising regularly and has made some dietary changes since December. She has lost little weight. She is dissatisfied with the inability to lose weight. She has been on Wellbutrin for PTSD/depression. She has had no weight loss with this. She states that her mother has been on phentermine and has had good results. She would like to discuss this medication and other weight loss medications today.  Of note, patient has a history of hypertension. Blood pressure is elevated today. She has not been compliant with her medications recently. Not taking HCTZ. She is also diabetic and has not been taking her diabetes medications.  Social Hx   Social History   Social History  . Marital status: Single    Spouse name: Richard  . Number of children: 2  . Years of education: 14   Occupational History  . 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 Smoking status: Current Every Day Smoker    Packs/day: 0.50    Types: Cigarettes    Start date: 05/14/2006  . Smokeless tobacco: Never Used  . Alcohol use 0.0 oz/week     Comment: Rare   . Drug use: No  . Sexual activity: Yes   Other Topics Concern  . None   Social History Narrative   Allison [name] grew up in [place] She is married and has 2 children. She enjoys spending time with her family. She enjoys reading and shopping.       Smokes < 10 cigarettes daily - trying to quit   Caffeine - drinks 3 cans of diet pepsi daily   Alcohol - rare    Review of Systems  Constitutional:       Lack of weight loss with diet and exercise.   Respiratory: Negative.   Cardiovascular: Negative.     Gastrointestinal: Negative.    Objective:  BP (!) 140/96 (BP Location: Right Arm, Patient Position: Sitting, Cuff Size: Large)   Pulse (!) 101   Temp 98.3 F (36.8 C) (Oral)   Wt 212 lb 6 oz (96.3 kg)   SpO2 97%   BMI 38.84 kg/m   BP/Weight 01/16/2016 10/26/2015 08/13/2015  Systolic BP 140 112 156  Diastolic BP 96 84 102  Wt. (Lbs) 212.38 206 213.5  BMI 38.84 37.67 39.04   Physical Exam  Constitutional: She is oriented to person, place, and time. She appears well-developed. No distress.  HENT:  Head: Normocephalic and atraumatic.  Pulmonary/Chest: Effort normal.  Neurological: She is alert and oriented to person, place, and time.  Psychiatric:  Flat affect, depressed mood.  Vitals reviewed.   Lab Results  Component Value Date   WBC 10.7 08/17/2015   HGB 13.9 08/17/2015   HCT 42 08/17/2015   PLT 354 08/17/2015   GLUCOSE 109 (H) 03/30/2012   CHOL 164 08/17/2015   TRIG 442 (A) 08/17/2015   HDL 25 (A) 08/17/2015   LDLCALC 90 07/24/2014   ALT 15 08/17/2015   AST 14 08/17/2015   NA 138 08/17/2015   K 4.0 08/17/2015   CL 106 01/24/2012   CREATININE 0.8 08/17/2015   BUN 20 08/17/2015   CO2 24 01/24/2012  TSH 1.14 08/17/2015   HGBA1C 6.1 08/17/2015    Assessment & Plan:   Problem List Items Addressed This Visit    HLD (hyperlipidemia)   Relevant Orders   Lipid panel   Type 2 diabetes mellitus without complication, with long-term current use of insulin (HCC)    Advised compliance with medication.      Relevant Orders   Hemoglobin A1c   Benign essential HTN    Uncontrolled/worsening. Due to noncompliance. Advised compliance with HCTZ. Labs today.      Relevant Orders   Comprehensive metabolic panel   Obesity, Class II, BMI 35-39.9, with comorbidity (HCC)    Established problem, worsening. Discussed medication options today. Proceeding with Belviq (phentermine contraindicated due to HTN). Stop Wellbutrin.      Relevant Medications   Lorcaserin HCl ER  20 MG TB24    Other Visit Diagnoses   None.     Meds ordered this encounter  Medications  . Lorcaserin HCl ER 20 MG TB24    Sig: Take 1 tablet by mouth daily.    Dispense:  90 tablet    Refill:  0   Follow-up: 3 months  Astin Sayre Adriana Simas DO [facility]

## 2016-01-16 NOTE — Progress Notes (Signed)
Pre visit review using our clinic review tool, if applicable. No additional management support is needed unless otherwise documented below in the visit note. 

## 2016-01-16 NOTE — Assessment & Plan Note (Signed)
Established problem, worsening. Discussed medication options today. Proceeding with Belviq (phentermine contraindicated due to HTN). Stop Wellbutrin.

## 2016-01-16 NOTE — Assessment & Plan Note (Signed)
Uncontrolled/worsening. Due to noncompliance. Advised compliance with HCTZ. Labs today.

## 2016-01-16 NOTE — Assessment & Plan Note (Signed)
Advised compliance with medication.

## 2016-04-30 ENCOUNTER — Ambulatory Visit: Payer: BLUE CROSS/BLUE SHIELD | Admitting: Family Medicine

## 2016-04-30 ENCOUNTER — Telehealth: Payer: Self-pay | Admitting: Family Medicine

## 2016-04-30 NOTE — Telephone Encounter (Signed)
FYI - Pt called to cancel appt today. Stated that she is sick and her baby is sick.

## 2016-04-30 NOTE — Telephone Encounter (Signed)
Provider aware of cancellation.

## 2016-05-09 ENCOUNTER — Ambulatory Visit: Payer: BLUE CROSS/BLUE SHIELD | Admitting: Family Medicine

## 2016-07-03 ENCOUNTER — Encounter: Payer: Self-pay | Admitting: Family Medicine

## 2016-07-03 ENCOUNTER — Ambulatory Visit (INDEPENDENT_AMBULATORY_CARE_PROVIDER_SITE_OTHER): Payer: BLUE CROSS/BLUE SHIELD | Admitting: Family Medicine

## 2016-07-03 DIAGNOSIS — I10 Essential (primary) hypertension: Secondary | ICD-10-CM

## 2016-07-03 DIAGNOSIS — E669 Obesity, unspecified: Secondary | ICD-10-CM

## 2016-07-03 DIAGNOSIS — Z794 Long term (current) use of insulin: Secondary | ICD-10-CM | POA: Diagnosis not present

## 2016-07-03 DIAGNOSIS — E119 Type 2 diabetes mellitus without complications: Secondary | ICD-10-CM | POA: Diagnosis not present

## 2016-07-03 DIAGNOSIS — E785 Hyperlipidemia, unspecified: Secondary | ICD-10-CM

## 2016-07-03 DIAGNOSIS — IMO0001 Reserved for inherently not codable concepts without codable children: Secondary | ICD-10-CM

## 2016-07-03 MED ORDER — HYDROCHLOROTHIAZIDE 25 MG PO TABS: 25.0000 mg | ORAL_TABLET | Freq: Every day | ORAL | 3 refills | Status: DC

## 2016-07-03 NOTE — Progress Notes (Signed)
Pre visit review using our clinic review tool, if applicable. No additional management support is needed unless otherwise documented below in the visit note. 

## 2016-07-03 NOTE — Assessment & Plan Note (Signed)
Unsure of control. Obtaining lipid panel. We'll await results before reinstituting treatment.

## 2016-07-03 NOTE — Assessment & Plan Note (Signed)
Unsure of control. Likely uncontrolled. Gave prescription for labs, which she obtains at her work. Holding off on medication at this time while awaiting A1c.

## 2016-07-03 NOTE — Progress Notes (Signed)
Subjective:  Patient ID: Allison [name], female    DOB: 1985-07-12  Age: 31 y.o. MRN: [id number]  CC: Follow up  HPI:  31 year old female with DM-2, HTN, HLD presents for follow up.  DM-2  Unsure of control.  Has stopped all medication.  Last time she checked her blood sugar was 1 month ago. It was 150.  HTN  BP elevated today.  She was previously on HCTZ. She is not taking any medications at this time.  HLD  Unsure of control.  Stopped all medications.  In need of labs.  Obesity   Continues to struggle with her weight.  She had no benefit from Belviq.  She would like to discuss weight loss medication today.  Social Hx   Social History   Social History  . Marital status: Single    Spouse name: Richard  . Number of children: 2  . Years of education: 14   Occupational History  . 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 Smoking status: Current Every Day Smoker    Packs/day: 0.50    Types: Cigarettes    Start date: 05/14/2006  . Smokeless tobacco: Never Used  . Alcohol use 0.0 oz/week     Comment: Rare   . Drug use: No  . Sexual activity: Yes   Other Topics Concern  . None   Social History Narrative   [name] [place] She is married and has 2 children. She enjoys spending time with her family. She enjoys reading and shopping.       Smokes < 10 cigarettes daily - trying to quit   Caffeine - drinks 3 cans of diet pepsi daily   Alcohol - rare    Review of Systems  Constitutional: Negative.   Respiratory: Negative.   Cardiovascular: Negative.    Objective:  BP (!) 137/93   Pulse (!) 108   Temp 98.6 F (37 C) (Oral)   Wt 215 lb 6.4 oz (97.7 kg)   SpO2 98%   BMI 39.40 kg/m   BP/Weight 07/03/2016 01/16/2016 10/26/2015  Systolic BP 137 140 112  Diastolic BP 93 96 84  Wt. (Lbs) 215.4 212.38 206  BMI 39.4 38.84 37.67   Physical Exam  Constitutional: She is oriented to  person, place, and time. She appears well-developed. No distress.  Cardiovascular: Normal rate and regular rhythm.   Pulmonary/Chest: Effort normal and breath sounds normal.  Neurological: She is alert and oriented to person, place, and time.  Psychiatric: She has a normal mood and affect.  Vitals reviewed.  Lab Results  Component Value Date   WBC 10.7 08/17/2015   HGB 13.9 08/17/2015   HCT 42 08/17/2015   PLT 354 08/17/2015   GLUCOSE 109 (H) 03/30/2012   CHOL 164 08/17/2015   TRIG 442 (A) 08/17/2015   HDL 25 (A) 08/17/2015   LDLCALC 90 07/24/2014   ALT 15 08/17/2015   AST 14 08/17/2015   NA 138 08/17/2015   K 4.0 08/17/2015   CL 106 01/24/2012   CREATININE 0.8 08/17/2015   BUN 20 08/17/2015   CO2 24 01/24/2012   TSH 1.14 08/17/2015   HGBA1C 6.1 08/17/2015    Assessment & Plan:   Problem List Items Addressed This Visit    Type 2 diabetes mellitus without complication, with long-term current use of insulin (HCC)    Unsure of control. Likely uncontrolled. Gave prescription for labs, which she obtains at her  work. Holding off on medication at this time while awaiting A1c.      Obesity, Class II, BMI 35-39.9, with comorbidity    Patient requesting weight loss medication. She had no benefit from Belviq. May benefit from the toes up. Awaiting labs.      HLD (hyperlipidemia)    Unsure of control. Obtaining lipid panel. We'll await results before reinstituting treatment.      Relevant Medications   hydrochlorothiazide (HYDRODIURIL) 25 MG tablet   Benign essential HTN    Uncontrolled. Restarting HCTZ today.      Relevant Medications   hydrochlorothiazide (HYDRODIURIL) 25 MG tablet      Meds ordered this encounter  Medications  . hydrochlorothiazide (HYDRODIURIL) 25 MG tablet    Sig: Take 1 tablet (25 mg total) by mouth daily.    Dispense:  90 tablet    Refill:  3    Follow-up: Pending labs  Brodee Mauritz [facility] DO [facility]

## 2016-07-03 NOTE — Assessment & Plan Note (Signed)
Patient requesting weight loss medication. She had no benefit from Belviq. May benefit from the toes up. Awaiting labs.

## 2016-07-03 NOTE — Patient Instructions (Addendum)
HCTZ daily.  We will call with the lab results.  Follow up to be arranged pending labs.  Take care  Dr. Adriana Simasook

## 2016-07-03 NOTE — Assessment & Plan Note (Signed)
Uncontrolled. Restarting HCTZ today.

## 2016-07-07 LAB — CBC AND DIFFERENTIAL
HCT: 44 % (ref 36–46)
HEMOGLOBIN: 14.4 g/dL (ref 12.0–16.0)
Neutrophils Absolute: 8 /uL
PLATELETS: 341 10*3/uL (ref 150–399)
WBC: 12.8 10*3/mL

## 2016-07-07 LAB — VITAMIN D 25 HYDROXY (VIT D DEFICIENCY, FRACTURES): VIT D 25 HYDROXY: 13.6

## 2016-07-07 LAB — TSH: TSH: 1.24 u[IU]/mL (ref 0.41–5.90)

## 2016-07-07 LAB — HEPATIC FUNCTION PANEL
ALT: 13 U/L (ref 7–35)
AST: 12 U/L — AB (ref 13–35)
Alkaline Phosphatase: 70 U/L (ref 25–125)
BILIRUBIN, TOTAL: 0.2 mg/dL

## 2016-07-07 LAB — BASIC METABOLIC PANEL
BUN: 12 mg/dL (ref 4–21)
CREATININE: 0.7 mg/dL (ref 0.5–1.1)
GLUCOSE: 211 mg/dL
POTASSIUM: 4.1 mmol/L (ref 3.4–5.3)
Sodium: 134 mmol/L — AB (ref 137–147)

## 2016-07-07 LAB — LIPID PANEL
Cholesterol: 182 mg/dL (ref 0–200)
HDL: 27 mg/dL — AB (ref 35–70)
Triglycerides: 530 mg/dL — AB (ref 40–160)

## 2016-07-17 ENCOUNTER — Ambulatory Visit: Payer: Self-pay | Admitting: Medical

## 2016-07-17 ENCOUNTER — Encounter: Payer: Self-pay | Admitting: Medical

## 2016-07-17 VITALS — BP 160/96 | HR 93 | Temp 98.5°F | Resp 16 | Ht 61.0 in | Wt 214.0 lb

## 2016-07-17 DIAGNOSIS — E119 Type 2 diabetes mellitus without complications: Secondary | ICD-10-CM

## 2016-07-17 NOTE — Progress Notes (Signed)
   Subjective:    Patient ID: Allison [name], female    DOB: [birth year], 31 y.o.   MRN: [id number]  HPI Patient came in for primary care appointment , but then said she would like to see Dr. Adriana Simasook again  as her primary doctor. 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 Takes BC powder which helps the headaches. She recently had a cough and was seen on Jan. 4th 2018 at [facility], placed on Amoxicillin  875 mg bid x 10 days. For double ear infection and acute respiratory infection. She says the medication helped and she now only has a cough with productive white phelgm. Denies fevers.     Objective:   Physical Exam  Constitutional: She appears well-developed and well-nourished.  Cardiovascular: Normal rate, regular rhythm and normal heart sounds.  Exam reveals no gallop and no friction rub.   No murmur heard. Pulmonary/Chest: Effort normal and breath sounds normal.  obese        Assessment & Plan:   Reviewed labs with patient, copy to patient. Hyperglycemia Hypertriglyceridemia,recommended fish oil, and to restart exercising again. Low HDL. A1C elevated 8.3 Low Vit D recommended otc Vitamin D3 4000IU/day. Recheck in one year. Elevated blood pressue, she has forgotten to take her HCTZ over the last 3 days. 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 So she is quite busy. Labs also look like she may have  had an infection , she denies feeling ill today. She says she will call Dr. Adriana Simasook today and make a follow- up appointment. If at anytime she changes her mind for primary care or has questions to contact the clinic for an appointment.

## 2016-07-17 NOTE — Progress Notes (Signed)
Patient is Type 2 diabetic for 4 years.  She had been taking inuslin and metformin.  But [facility] said her labs/sugars had been good so he took her off of the insulin and metformin about 1 year ago.  He saw her in February and ordered labs, which were drawn here.  Plan to be determinied.  In terms of bp management, she was rx HCTZ, but has missed several recent doses.  She is working 6 days per week and just hasn't taken bp meds.

## 2016-07-23 ENCOUNTER — Encounter: Payer: Self-pay | Admitting: Family Medicine

## 2016-07-23 ENCOUNTER — Ambulatory Visit (INDEPENDENT_AMBULATORY_CARE_PROVIDER_SITE_OTHER): Payer: BLUE CROSS/BLUE SHIELD | Admitting: Family Medicine

## 2016-07-23 DIAGNOSIS — Z794 Long term (current) use of insulin: Secondary | ICD-10-CM | POA: Diagnosis not present

## 2016-07-23 DIAGNOSIS — E785 Hyperlipidemia, unspecified: Secondary | ICD-10-CM | POA: Diagnosis not present

## 2016-07-23 DIAGNOSIS — I10 Essential (primary) hypertension: Secondary | ICD-10-CM | POA: Diagnosis not present

## 2016-07-23 DIAGNOSIS — E119 Type 2 diabetes mellitus without complications: Secondary | ICD-10-CM | POA: Diagnosis not present

## 2016-07-23 MED ORDER — DULAGLUTIDE 0.75 MG/0.5ML ~~LOC~~ SOAJ: 0.7500 mg | SUBCUTANEOUS | 3 refills | Status: DC

## 2016-07-23 NOTE — Patient Instructions (Signed)
Trulicity weekly.  Follow up in 1 month.  Take care  Dr. Adriana Simasook

## 2016-07-23 NOTE — Progress Notes (Signed)
Subjective:  Patient ID: Allison [name], female    DOB: 1985/06/04  Age: 31 y.o. MRN: [id number]  CC: Follow up  HPI:  31 year old female with obesity, DM 2, hypertension, hyperlipidemia presents for follow-up.  DM-2  Not checking sugars.  Most recent was 8.3 (07/09/16).  Not on any medication at this time (previously stopped all meds).  Cannot tolerate metformin.  HTN  Has not been compliant with HCTZ.  BP elevated today.  HLD  No meds at this time.  Triglycerides markedly elevated.   Low HDL.  Was previously on fibrate.   Social Hx   Social History   Social History  . Marital status: Married    Spouse name: Richard  . Number of children: 2  . Years of education: 14   Occupational History  . 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 Smoking status: Current Every Day Smoker    Packs/day: 0.50    Types: Cigarettes    Start date: 05/14/2006  . Smokeless tobacco: Never Used  . Alcohol use 0.0 oz/week     Comment: Rare   . Drug use: No  . Sexual activity: Yes   Other Topics Concern  . None   Social History Narrative   Allison [name] [place] She is married and has 2 children. She enjoys spending time with her family. She enjoys reading and shopping.       Smokes < 10 cigarettes daily - trying to quit   Caffeine - drinks 3 cans of diet pepsi daily   Alcohol - rare    Review of Systems  Constitutional: Negative.   Respiratory: Negative.   Cardiovascular: Negative.    Objective:  BP (!) 158/100   Pulse 96   Temp 98.5 F (36.9 C) (Oral)   Wt 213 lb 8 oz (96.8 kg)   SpO2 97%   BMI 40.34 kg/m   BP/Weight 07/23/2016 07/17/2016 07/03/2016  Systolic BP 158 160 137  Diastolic BP 100 96 93  Wt. (Lbs) 213.5 214 215.4  BMI 40.34 40.43 39.4    Physical Exam  Constitutional: She is oriented to person, place, and time. She appears well-developed. No distress.  Cardiovascular: Normal rate  and regular rhythm.   Pulmonary/Chest: Effort normal and breath sounds normal.  Neurological: She is alert and oriented to person, place, and time.  Psychiatric: She has a normal mood and affect.  Vitals reviewed.   Lab Results  Component Value Date   WBC 12.8 07/07/2016   HGB 14.4 07/07/2016   HCT 44 07/07/2016   PLT 341 07/07/2016   GLUCOSE 109 (H) 03/30/2012   CHOL 182 07/07/2016   TRIG 530 (A) 07/07/2016   HDL 27 (A) 07/07/2016   LDLCALC 90 07/24/2014   ALT 13 07/07/2016   AST 12 (A) 07/07/2016   NA 134 (A) 07/07/2016   K 4.1 07/07/2016   CL 106 01/24/2012   CREATININE 0.7 07/07/2016   BUN 12 07/07/2016   CO2 24 01/24/2012   TSH 1.24 07/07/2016   HGBA1C 6.1 08/17/2015    Assessment & Plan:   Problem List Items Addressed This Visit    Type 2 diabetes mellitus without complication, with long-term current use of insulin (HCC)    Uncontrolled. Cannot tolerate metformin. Starting Trulicity.       Relevant Medications   Dulaglutide (TRULICITY) 0.75 MG/0.5ML SOPN   HLD (hyperlipidemia)    Uncontrolled, in setting of uncontrolled DM. Will re-evaluate once diabetes  is well controlled. Given patient's age, guidelines do not advocate for statin therapy.      Benign essential HTN    Not at goal, secondary to noncompliance. Advised compliance with HCTZ.         Meds ordered this encounter  Medications  . Dulaglutide (TRULICITY) 0.75 MG/0.5ML SOPN    Sig: Inject 0.75 mg into the skin once a week.    Dispense:  4 pen    Refill:  3    Follow-up: 1 month  Kimmy Parish DO [facility]

## 2016-07-23 NOTE — Progress Notes (Unsigned)
07/07/16

## 2016-07-23 NOTE — Assessment & Plan Note (Signed)
Not at goal, secondary to noncompliance. Advised compliance with HCTZ.

## 2016-07-23 NOTE — Assessment & Plan Note (Signed)
Uncontrolled. Cannot tolerate metformin. Starting Trulicity.

## 2016-07-23 NOTE — Progress Notes (Signed)
Pre visit review using our clinic review tool, if applicable. No additional management support is needed unless otherwise documented below in the visit note. 

## 2016-07-23 NOTE — Assessment & Plan Note (Signed)
Uncontrolled, in setting of uncontrolled DM. Will re-evaluate once diabetes is well controlled. Given patient's age, guidelines do not advocate for statin therapy.

## 2016-08-25 ENCOUNTER — Ambulatory Visit (INDEPENDENT_AMBULATORY_CARE_PROVIDER_SITE_OTHER): Payer: BLUE CROSS/BLUE SHIELD | Admitting: Family Medicine

## 2016-08-25 ENCOUNTER — Encounter: Payer: Self-pay | Admitting: Family Medicine

## 2016-08-25 DIAGNOSIS — E785 Hyperlipidemia, unspecified: Secondary | ICD-10-CM | POA: Diagnosis not present

## 2016-08-25 DIAGNOSIS — E119 Type 2 diabetes mellitus without complications: Secondary | ICD-10-CM

## 2016-08-25 DIAGNOSIS — Z794 Long term (current) use of insulin: Secondary | ICD-10-CM

## 2016-08-25 DIAGNOSIS — Z6841 Body Mass Index (BMI) 40.0 and over, adult: Secondary | ICD-10-CM

## 2016-08-25 DIAGNOSIS — I10 Essential (primary) hypertension: Secondary | ICD-10-CM | POA: Diagnosis not present

## 2016-08-25 MED ORDER — DULAGLUTIDE 1.5 MG/0.5ML ~~LOC~~ SOAJ: 1.5000 mg | SUBCUTANEOUS | 3 refills | Status: DC

## 2016-08-25 MED ORDER — GLUCOSE BLOOD VI STRP: ORAL_STRIP | 11 refills | Status: DC

## 2016-08-25 NOTE — Progress Notes (Signed)
Pre visit review using our clinic review tool, if applicable. No additional management support is needed unless otherwise documented below in the visit note. 

## 2016-08-25 NOTE — Patient Instructions (Addendum)
Check your blood sugar daily (fasting).  Check some after eating (2 hours).  Keep a log and bring it to your next appt.  Take your HCTZ every day.  I'll see you in 1 month.   Take care  Dr. Adriana Simas

## 2016-08-25 NOTE — Assessment & Plan Note (Signed)
Uncontrolled due to noncompliance. Advise compliance HCTZ. If BP is not a goal at next visit we'll add ACE/ARB.

## 2016-08-25 NOTE — Assessment & Plan Note (Signed)
Advised to check sugars (fastings and postprandial). Increasing Trulicity.

## 2016-08-25 NOTE — Progress Notes (Signed)
Subjective:  Patient ID: Allison [name], female    DOB: [birth year]  Age: 31 y.o. MRN: [id number]  CC: Follow up  HPI:  31 year old female with DM 2, HTN, HLD, Morbid obesity presents for followup.  DM-2  Not checking sugars.  Currently compliant with Trulicity tolerating.  Hypertension  Uncontrolled due to noncompliance. Patient has difficulty remembering to take her medication. She is currently on HCTZ.  HLD  Low HDL and elevated triglycerides.  Is not currently on statin therapy given age.  Morbid obesity  Weight is currently stable.  She is very concerned about her weight.  She would like to discuss bariatric surgery.  Social Hx   Social History   Social History  . Marital status: Married    Spouse name: Richard  . Number of children: 2  . 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 Smoking status: Current Every Day Smoker    Packs/day: 0.50    Types: Cigarettes    Start date: 05/14/2006  . Smokeless tobacco: Never Used  . Alcohol use 0.0 oz/week     Comment: Rare   . Drug use: No  . Sexual activity: Yes   Other Topics Concern  . None   Social History Narrative   Allison [name] grew up in [place], [place]. She is married and has 2 children. She enjoys spending time with her family. She enjoys reading and shopping.       Smokes < 10 cigarettes daily - trying to quit   Caffeine - drinks 3 cans of diet pepsi daily   Alcohol - rare    Review of Systems  Constitutional: Negative.   Cardiovascular: Negative.    Objective:  BP (!) 156/98 (BP Location: Left Arm, Cuff Size: Large)   Pulse (!) 117   Temp 99 F (37.2 C) (Oral)   Wt 213 lb (96.6 kg)   SpO2 97%   BMI 40.25 kg/m   BP/Weight 08/25/2016 07/23/2016 07/17/2016  Systolic BP 156 158 160  Diastolic BP 98 100 96  Wt. (Lbs) 213 213.5 214  BMI 40.25 40.34 40.43   Physical Exam  Constitutional: She is  oriented to person, place, and time. She appears well-developed. No distress.  Cardiovascular: Normal rate and regular rhythm.   Pulmonary/Chest: Effort normal and breath sounds normal.  Neurological: She is alert and oriented to person, place, and time.  Psychiatric: She has a normal mood and affect.  Vitals reviewed.  Lab Results  Component Value Date   WBC 12.8 07/07/2016   HGB 14.4 07/07/2016   HCT 44 07/07/2016   PLT 341 07/07/2016   GLUCOSE 109 (H) 03/30/2012   CHOL 182 07/07/2016   TRIG 530 (A) 07/07/2016   HDL 27 (A) 07/07/2016   LDLCALC 90 07/24/2014   ALT 13 07/07/2016   AST 12 (A) 07/07/2016   NA 134 (A) 07/07/2016   K 4.1 07/07/2016   CL 106 01/24/2012   CREATININE 0.7 07/07/2016   BUN 12 07/07/2016   CO2 24 01/24/2012   TSH 1.24 07/07/2016   HGBA1C 6.1 08/17/2015    Assessment & Plan:   Problem List Items Addressed This Visit    Type 2 diabetes mellitus without complication, with long-term current use of insulin (HCC)    Advised to check sugars (fastings and postprandial). Increasing Trulicity.      Relevant Medications   Dulaglutide (TRULICITY) 1.5 MG/0.5ML SOPN   Morbid obesity  with BMI of 40.0-44.9, adult [facility])    Discussed bariatric surgery. Handout given today.      Relevant Medications   Dulaglutide (TRULICITY) 1.5 MG/0.5ML SOPN   HLD (hyperlipidemia)    Will continue to follow closely given age (not currently a candidate given current guidelines).      Benign essential HTN    Uncontrolled due to noncompliance. Advise compliance HCTZ. If BP is not a goal at next visit we'll add ACE/ARB.         Meds ordered this encounter  Medications  . Dulaglutide (TRULICITY) 1.5 MG/0.5ML SOPN    Sig: Inject 1.5 mg into the skin once a week.    Dispense:  4 pen    Refill:  3  . glucose blood test strip    Sig: Use as instructed    Dispense:  100 each    Refill:  11    Accu-Chek (Aviva Plus) test strips.    Follow-up: 1 month  Zadkiel Dragan  DO [facility]

## 2016-08-25 NOTE — Assessment & Plan Note (Signed)
Will continue to follow closely given age (not currently a candidate given current guidelines).

## 2016-08-25 NOTE — Assessment & Plan Note (Signed)
Discussed bariatric surgery. Handout given today.

## 2016-10-07 ENCOUNTER — Ambulatory Visit: Payer: BLUE CROSS/BLUE SHIELD | Admitting: Family Medicine

## 2016-11-17 ENCOUNTER — Ambulatory Visit: Payer: Self-pay | Admitting: Medical

## 2016-11-17 ENCOUNTER — Ambulatory Visit
Admission: RE | Admit: 2016-11-17 | Discharge: 2016-11-17 | Disposition: A | Discharge status: Home / Self Care | Payer: BLUE CROSS/BLUE SHIELD | Source: Ambulatory Visit | Attending: Medical | Admitting: Medical

## 2016-11-17 ENCOUNTER — Encounter: Payer: Self-pay | Admitting: Medical

## 2016-11-17 VITALS — BP 144/80 | HR 122 | Temp 97.2°F | Resp 20 | Ht 61.0 in | Wt 210.0 lb

## 2016-11-17 DIAGNOSIS — M25571 Pain in right ankle and joints of right foot: Secondary | ICD-10-CM | POA: Diagnosis present

## 2016-11-17 DIAGNOSIS — M7989 Other specified soft tissue disorders: Secondary | ICD-10-CM | POA: Insufficient documentation

## 2016-11-17 DIAGNOSIS — M779 Enthesopathy, unspecified: Secondary | ICD-10-CM | POA: Diagnosis not present

## 2016-11-17 DIAGNOSIS — E118 Type 2 diabetes mellitus with unspecified complications: Secondary | ICD-10-CM

## 2016-11-17 DIAGNOSIS — R079 Chest pain, unspecified: Secondary | ICD-10-CM

## 2016-11-17 DIAGNOSIS — Z794 Long term (current) use of insulin: Secondary | ICD-10-CM

## 2016-11-17 NOTE — Progress Notes (Addendum)
Chest pain will refer to cardiology.  History of diabetes mellitus without insulin use.  Subjective:    Patient ID: Allison [name], female    DOB: 05/28/1985, 31 y.o.   MRN: [id number]  HPI 31 yo female, twisted right ankle about  10 pm on Sunday, thought she heard a pop.  Pain on right malleous and anterior to malleolus. No numbness or tingling.   Review of Systems  Constitutional: Negative for chills and fatigue.  HENT: Negative.   Eyes: Negative for discharge and itching.  Respiratory: Negative for cough.   Cardiovascular: Positive for chest pain.  Gastrointestinal: Negative for abdominal pain.  Genitourinary: Negative for hematuria.  Musculoskeletal: Positive for joint swelling.  Skin: Positive for color change.  Neurological: Negative for dizziness and syncope.  Psychiatric/Behavioral: The patient is not nervous/anxious.        Smoker  1ppd - 1 1/2ppd.. Chest pain , smoking more usually smoking 1/2 ppd stressed due to brothers death.  Came on with sitting, lasting a few seconds "maybe a minute", she just continued to sit.  History of anxiety but this was different. Came on one time on 11/13/2016.No further episodes.  Objective:   Physical Exam  Constitutional: She is oriented to person, place, and time. She appears well-developed and well-nourished.  HENT:  Head: Normocephalic and atraumatic.  Right Ear: External ear normal.  Left Ear: External ear normal.  Eyes: Pupils are equal, round, and reactive to light. Conjunctivae and EOM are normal.  Neck: Normal range of motion. Neck supple.  Cardiovascular: Normal rate, regular rhythm and normal heart sounds.  Exam reveals no gallop and no friction rub.   No murmur heard. Pulmonary/Chest: Effort normal and breath sounds normal.  Musculoskeletal: She exhibits edema and tenderness.  Neurological: She is alert and oriented to person, place, and time.  Skin: Skin is warm and dry.  Psychiatric: She has a normal mood and  affect. Her behavior is normal. Judgment and thought content normal.      Right malleolus with swollen and  Red. Pain with weight bearing on the lateral side of  Foot.  Recheck sat  98%, HR 100   last cigerette 7:45am. Assessment & Plan:   Ankle pain right  Will send for x-ray. Taking  Tulicity out of medication but feels it does not work.  Reviewed with patient , sugars are running  267, -243 after that she decided not to recheck them because she was  not happy with the numbers and did not want to worry about her numbers. Should follow up with her primary care doctor. Non weight bearing  Till I call patient with x-ray results. Crutches supplied to patient. Patient with chest pain occurring, none currently will refer to cardiology.  Called patient with x-ray results, soft tissue swelling no notable fracture or dislocation. Weight bearing as tolerated. Follow up in 3-5 days if not improving.

## 2016-11-17 NOTE — Patient Instructions (Addendum)
I will call you at  [phone]. Ice , elevate, and compression with an ace wrap. Non-weight bearing. OTC Motrin 4 tablets = 800mg   Three times a day with food. Up to one week. For pain and swelling.

## 2016-11-20 ENCOUNTER — Ambulatory Visit: Payer: Self-pay | Admitting: Adult Health

## 2016-11-20 VITALS — BP 130/80 | HR 112 | Temp 98.1°F | Resp 16 | Ht 61.0 in | Wt 210.0 lb

## 2016-11-20 DIAGNOSIS — S99921A Unspecified injury of right foot, initial encounter: Secondary | ICD-10-CM

## 2016-11-20 NOTE — Progress Notes (Signed)
Subjective:    Patient ID: Allison [name], female    DOB: 11/04/1985, 31 y.o.   MRN: [id number] Patient presents to clinic for follow up regarding right foot injury she was seen for on 11/17/16.  Patient follow up for right foot injury last Sunday night.  X-Ray results reviewed. Denies any other injury since initial.  She reports taking Ibuprofen once or twice since injury as she reports she does not like taking medications.  Pain has improved to 1/10 with standing, no pain with sitting. Has been using crutches- no longer using crutches but continues to limp and walk with guarding of right foot she reports.  Denies any popping or crepitus. Denies any skin color change.  HPI     Review of Systems  Constitutional: Positive for activity change (She is walking less than she was prior to injury, but she reports she is walking better than office visit  on 11/17/16 , she is still " limping" ). Negative for appetite change, chills, diaphoresis, fatigue, fever and unexpected weight change.  HENT: Negative.   Respiratory: Negative.   Genitourinary: Negative.   Musculoskeletal: Positive for gait problem (due to ankle injury continues to limp but has improved much since initial visit/ increased pain with walking.) and joint swelling (mild ankle swelling/ worsens with ambulation per patient/ mild swelling with activity such as walking). Negative for back pain, neck pain and neck stiffness.  Skin: Negative.   Hematological: Negative.   Psychiatric/Behavioral: Negative for agitation, behavioral problems and confusion.       Objective:   Physical Exam  Constitutional: She is oriented to person, place, and time. She appears well-developed and well-nourished. No distress.  HENT:  Head: Normocephalic and atraumatic.  Eyes: Pupils are equal, round, and reactive to light. Conjunctivae and EOM are normal.  Neck: Normal range of motion. Neck supple.  Cardiovascular: Normal rate, regular rhythm and  normal heart sounds.   Pulmonary/Chest: Effort normal and breath sounds normal.  Musculoskeletal: She exhibits edema. She exhibits no tenderness or deformity.       Right ankle: She exhibits decreased range of motion (to the right ) and swelling (mild ). No lateral malleolus tenderness found.       Feet:  All other range of motion normal. She ambulates with a limp and guards right foot with ambulation.   Lymphadenopathy:    She has no cervical adenopathy.    She has no axillary adenopathy.  Neurological: She is alert and oriented to person, place, and time. She has normal strength and normal reflexes. No sensory deficit. She displays a negative Romberg sign. GCS eye subscore is 4. GCS verbal subscore is 5. GCS motor subscore is 6.  Skin: Skin is warm, dry and intact. No abrasion, no bruising, no burn, no ecchymosis, no laceration, no lesion, no petechiae and no purpura noted. Rash is not macular, not papular, not maculopapular, not nodular, not pustular, not vesicular and not urticarial. She is not diaphoretic. No erythema. No pallor.  Psychiatric: She has a normal mood and affect. Her speech is normal and behavior is normal. Judgment and thought content normal.  Vitals reviewed.     Tenderness to right malleolus with palpation.   Order Status: Completed Updated: 11/17/16 1149  Narrative:   CLINICAL DATA: 31 year old female twisted right ankle last night. Pain and swelling laterally. Initial encounter.  EXAM: RIGHT ANKLE - COMPLETE 3+ VIEW  COMPARISON: None.  FINDINGS: Lateral malleolar soft tissue swelling consistent with soft tissue injury.  No discrete fracture noted. On oblique view, trabecular felt to be responsible for lucency rather than fracture.  Plantar spur.  Small spur anterior distal talus consistent with degenerative changes.  IMPRESSION: Soft tissue swelling lateral malleolar region consistent with soft tissue injury without underlying fracture or  dislocation.  Plantar spur.   Electronically Signed By: Lacy Duverney M.D. On: 11/17/2016 11:28        Assessment & Plan:   1. X ray revealed soft tissue injury. patient still with pain ambulating. Advised REST ICE ELEVATION AND COMPRESSION. Advised Motrin 600 mg every 8 hours. Advised to return to clinic on 11/26/16 for recheck. Patient advised to return to clinic at any time if symptoms change or worsen at anytime. Patient advised to seek care if worsens after hours at Urgent Care. Patient verbalized all understanding of above instructions.

## 2016-11-21 NOTE — Patient Instructions (Signed)
Ankle Sprain  An ankle sprain is a stretch or tear in one of the tough tissues (ligaments) in your ankle.  Follow these instructions at home:   Rest your ankle.   Take over-the-counter and prescription medicines only as told by your doctor.   For 2-3 days, keep your ankle higher than the level of your heart (elevated) as much as possible.   If directed, put ice on the area:  ? Put ice in a plastic bag.  ? Place a towel between your skin and the bag.  ? Leave the ice on for 20 minutes, 2-3 times a day.   If you were given a brace:  ? Wear it as told.  ? Take it off to shower or bathe.  ? Try not to move your ankle much, but wiggle your toes from time to time. This helps to prevent swelling.   If you were given an elastic bandage (dressing):  ? Take it off when you shower or bathe.  ? Try not to move your ankle much, but wiggle your toes from time to time. This helps to prevent swelling.  ? Adjust the bandage to make it more comfortable if it feels too tight.  ? Loosen the bandage if you lose feeling in your foot, your foot tingles, or your foot gets cold and blue.   If you have crutches, use them as told by your doctor. Continue to use them until you can walk without feeling pain in your ankle.  Contact a doctor if:   Your bruises or swelling are quickly getting worse.   Your pain does not get better after you take medicine.  Get help right away if:   You cannot feel your toes or foot.   Your toes or your foot looks blue.   You have very bad pain that gets worse.  This information is not intended to replace advice given to you by your health care provider. Make sure you discuss any questions you have with your health care provider.  Document Released: 10/15/2007 Document Revised: 10/04/2015 Document Reviewed: 11/28/2014  Elsevier Interactive Patient Education  2018 [employer]

## 2016-11-26 ENCOUNTER — Encounter: Payer: Self-pay | Admitting: Family Medicine

## 2016-11-26 ENCOUNTER — Ambulatory Visit (INDEPENDENT_AMBULATORY_CARE_PROVIDER_SITE_OTHER): Payer: BLUE CROSS/BLUE SHIELD | Admitting: Family Medicine

## 2016-11-26 ENCOUNTER — Encounter: Payer: Self-pay | Admitting: Adult Health

## 2016-11-26 ENCOUNTER — Ambulatory Visit: Payer: Self-pay | Admitting: Adult Health

## 2016-11-26 VITALS — BP 154/84 | HR 107 | Temp 98.7°F

## 2016-11-26 DIAGNOSIS — I10 Essential (primary) hypertension: Secondary | ICD-10-CM

## 2016-11-26 DIAGNOSIS — E119 Type 2 diabetes mellitus without complications: Secondary | ICD-10-CM | POA: Diagnosis not present

## 2016-11-26 DIAGNOSIS — Z91199 Patient's noncompliance with other medical treatment and regimen due to unspecified reason: Secondary | ICD-10-CM | POA: Insufficient documentation

## 2016-11-26 DIAGNOSIS — M25571 Pain in right ankle and joints of right foot: Secondary | ICD-10-CM

## 2016-11-26 DIAGNOSIS — Z9119 Patient's noncompliance with other medical treatment and regimen: Secondary | ICD-10-CM | POA: Diagnosis not present

## 2016-11-26 DIAGNOSIS — Z794 Long term (current) use of insulin: Secondary | ICD-10-CM | POA: Diagnosis not present

## 2016-11-26 MED ORDER — INSULIN PEN NEEDLE 32G X 4 MM MISC: 11 refills | Status: DC

## 2016-11-26 MED ORDER — BASAGLAR KWIKPEN 100 UNIT/ML ~~LOC~~ SOPN: 10.0000 [IU] | PEN_INJECTOR | Freq: Every day | SUBCUTANEOUS | 0 refills | Status: DC

## 2016-11-26 MED ORDER — DULAGLUTIDE 1.5 MG/0.5ML ~~LOC~~ SOAJ: 1.5000 mg | SUBCUTANEOUS | 3 refills | Status: DC

## 2016-11-26 NOTE — Patient Instructions (Signed)
Back on Trulicity.  Basaglar 10 units daily. Increase by 1 unit daily until sugars are below 150.  Follow up in 1-2 week with Pharm D.  Take care  Dr. Adriana Simasook

## 2016-11-26 NOTE — Assessment & Plan Note (Signed)
Uncontrolled and worsening. Patient continues to be noncompliant. I'm not sure why. I feel that she is struggling with her mood and personal issues. Restarting Trulicity. Starting Basaglar 10 units (with titration 1 unit daily until fastings are below 150). Follow up with PharmD in 1-2 weeks.

## 2016-11-26 NOTE — Patient Instructions (Signed)
Ankle Pain Many things can cause ankle pain, including an injury to the area and overuse of the ankle.The ankle joint holds your body weight and allows you to move around. Ankle pain can occur on either side or the back of one ankle or both ankles. Ankle pain may be sharp and burning or dull and aching. There may be tenderness, stiffness, redness, or warmth around the ankle. Follow these instructions at home: Activity  Rest your ankle as told by your health care provider. Avoid any activities that cause ankle pain.  Do exercises as told by your health care provider.  Ask your health care provider if you can drive. Using a brace, a bandage, or crutches  If you were given a brace: ? Wear it as told by your health care provider. ? Remove it when you take a bath or a shower. ? Try not to move your ankle very much, but wiggle your toes from time to time. This helps to prevent swelling.  If you were given an elastic bandage: ? Remove it when you take a bath or a shower. ? Try not to move your ankle very much, but wiggle your toes from time to time. This helps to prevent swelling. ? Adjust the bandage to make it more comfortable if it feels too tight. ? Loosen the bandage if you have numbness or tingling in your foot or if your foot turns cold and blue.  If you have crutches, use them as told by your health care provider. Continue to use them until you can walk without feeling pain in your ankle. Managing pain, stiffness, and swelling  Raise (elevate) your ankle above the level of your heart while you are sitting or lying down.  If directed, apply ice to the area: ? Put ice in a plastic bag. ? Place a towel between your skin and the bag. ? Leave the ice on for 20 minutes, 2-3 times per day. General instructions  Keep all follow-up visits as told by your health care provider. This is important.  Record this information that may be helpful for you and your health care provider: ? How  often you have ankle pain. ? Where the pain is located. ? What the pain feels like.  Take over-the-counter and prescription medicines only as told by your health care provider. Contact a health care provider if:  Your pain gets worse.  Your pain is not relieved with medicines.  You have a fever or chills.  You are having more trouble with walking.  You have new symptoms. Get help right away if:  Your foot, leg, toes, or ankle tingles or becomes numb.  Your foot, leg, toes, or ankle becomes swollen.  Your foot, leg, toes, or ankle turns pale or blue. This information is not intended to replace advice given to you by your health care provider. Make sure you discuss any questions you have with your health care provider. Document Released: 10/16/2009 Document Revised: 12/28/2015 Document Reviewed: 11/28/2014 Elsevier Interactive Patient Education  2017 [employer] Inc[employer] RICE for Routine Care of Injuries Many injuries can be cared for using rest, ice, compression, and elevation (RICE therapy). Using RICE therapy can help to lessen pain and swelling. It can help your body to heal. Rest Reduce your normal activities and avoid using the injured part of your body. You can go back to your normal activities when you feel okay and your doctor says it is okay. Ice Do not put ice on your bare skin.  Put ice in a plastic bag.  Place a towel between your skin and the bag.  Leave the ice on for 20 minutes, 2-3 times a day.  Do this for as long as told by your doctor. Compression Compression means putting pressure on the injured area. This can be done with an elastic bandage. If an elastic bandage has been applied:  Remove and reapply the bandage every 3-4 hours or as told by your doctor.  Make sure the bandage is not wrapped too tight. Wrap the bandage more loosely if part of your body beyond the bandage is blue, swollen, cold, painful, or loses feeling (numb).  See your doctor if the  bandage seems to make your problems worse.  Elevation Elevation means keeping the injured area raised. Raise the injured area above your heart or the center of your chest if you can. When should I get help? You should get help if:  You keep having pain and swelling.  Your symptoms get worse.  Get help right away if: You should get help right away if:  You have sudden bad pain at or below the area of your injury.  You have redness or more swelling around your injury.  You have tingling or numbness at or below the injury that does not go away when you take off the bandage.  This information is not intended to replace advice given to you by your health care provider. Make sure you discuss any questions you have with your health care provider. Document Released: 10/15/2007 Document Revised: 03/25/2016 Document Reviewed: 04/05/2014 Elsevier Interactive Patient Education  2017 [employer]

## 2016-11-26 NOTE — Assessment & Plan Note (Signed)
Control and HCTZ. We'll continue. Needs ACEI/ARB. I'm hesitant to do so as she is noncompliant.

## 2016-11-26 NOTE — Assessment & Plan Note (Signed)
Patient continues to have issues with compliance. If this continues, I will be forced to dismiss her from the practice.

## 2016-11-26 NOTE — Progress Notes (Signed)
Subjective:  Patient ID: Allison [name], female    DOB: 07-07-1985  Age: 31 y.o. MRN: [id number]  CC: Follow up  HPI:  31 year old female with morbid obesity, hypertension, hyperlipidemia, DM 2, tobacco abuse presents for follow-up.  DM 2  Blood sugars elevated and worsening. She states the majority are greater than 230.  She has stopped taking the Trulicity.  She states that after her brother passed away she just didn't refill it.  She continues to be noncompliant.  No reports of polyuria or polydipsia.  She has not tolerated metformin in the past.  She states that she did well on insulin previously.  I asked her why she continues to stop taking her medications and not be compliant, and she seems to be not sure.  Hypertension  Fair control on HCTZ.  She endorses compliance with HCTZ.  Social Hx   Social History   Social History  . Marital status: Married    Spouse name: Richard  . Number of children: 2  . Years of education: 14   Occupational History  . 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 Smoking status: Current Every Day Smoker    Packs/day: 1.00    Years: 10.00    Types: Cigarettes    Start date: 05/14/2006  . Smokeless tobacco: Never Used  . Alcohol use 0.0 oz/week     Comment: Rare   . Drug use: No  . Sexual activity: Yes   Other Topics Concern  . None   Social History Narrative   [name] grew up in [place] She is married and has 2 children. She enjoys spending time with her family. She enjoys reading and shopping.       Smokes < 10 cigarettes daily - trying to quit   Caffeine - drinks 3 cans of diet pepsi daily   Alcohol - rare    Review of Systems  Constitutional: Negative.   Endocrine: Negative.    Objective:  BP 138/90   Pulse 95   Temp 98.4 F (36.9 C) (Oral)   Wt 213 lb 3.2 oz (96.7 kg)   LMP 11/20/2016   SpO2 95%   BMI 40.28 kg/m   BP/Weight 11/26/2016  11/20/2016 11/17/2016  Systolic BP 138 130 144  Diastolic BP 90 80 80  Wt. (Lbs) 213.2 210 210  BMI 40.28 39.68 39.68   Physical Exam  Constitutional: She is oriented to person, place, and time. She appears well-developed. No distress.  Cardiovascular: Normal rate and regular rhythm.   No murmur heard. Pulmonary/Chest: Effort normal and breath sounds normal. She has no wheezes. She has no rales.  Neurological: She is alert and oriented to person, place, and time.  Psychiatric:  Flat affect, depressed mood.  Vitals reviewed.   Lab Results  Component Value Date   WBC 12.8 07/07/2016   HGB 14.4 07/07/2016   HCT 44 07/07/2016   PLT 341 07/07/2016   GLUCOSE 109 (H) 03/30/2012   CHOL 182 07/07/2016   TRIG 530 (A) 07/07/2016   HDL 27 (A) 07/07/2016   LDLCALC 90 07/24/2014   ALT 13 07/07/2016   AST 12 (A) 07/07/2016   NA 134 (A) 07/07/2016   K 4.1 07/07/2016   CL 106 01/24/2012   CREATININE 0.7 07/07/2016   BUN 12 07/07/2016   CO2 24 01/24/2012   TSH 1.24 07/07/2016   HGBA1C 6.1 08/17/2015    Assessment & Plan:   Problem List Items Addressed This  Visit      Cardiovascular and Mediastinum   Benign essential HTN    Control and HCTZ. We'll continue. Needs ACEI/ARB. I'm hesitant to do so as she is noncompliant.        Endocrine   Type 2 diabetes mellitus without complication, with long-term current use of insulin (HCC)    Uncontrolled and worsening. Patient continues to be noncompliant. I'm not sure why. I feel that she is struggling with her mood and personal issues. Restarting Trulicity. Starting Basaglar 10 units (with titration 1 unit daily until fastings are below 150). Follow up with PharmD in 1-2 weeks.      Relevant Medications   Dulaglutide (TRULICITY) 1.5 MG/0.5ML SOPN   Insulin Glargine (BASAGLAR KWIKPEN) 100 UNIT/ML SOPN     Other   Noncompliance    Patient continues to have issues with compliance. If this continues, I will be forced to dismiss her from  the practice.        Meds ordered this encounter  Medications  . Dulaglutide (TRULICITY) 1.5 MG/0.5ML SOPN    Sig: Inject 1.5 mg into the skin once a week.    Dispense:  4 pen    Refill:  3  . Insulin Glargine (BASAGLAR KWIKPEN) 100 UNIT/ML SOPN    Sig: Inject 0.1 mLs (10 Units total) into the skin at bedtime.    Dispense:  1 pen    Refill:  0  . Insulin Pen Needle (BD PEN NEEDLE NANO U/F) 32G X 4 MM MISC    Sig: Use daily to inject insulin.    Dispense:  100 each    Refill:  11   Follow-up: 1-2 weeks with Pharm D  Everlene Other DO [facility]

## 2016-11-26 NOTE — Progress Notes (Signed)
Subjective:     Patient ID: Allison [name], female   DOB: Jul 24, 1985, 89 y.o.   MRN: [id number]  HPI  Patient is a 31 year old female who presents for  follow up to right ankle pain/ swelling.. She has   Mild to moderate right ankle swelling with ambulation today , she is reports continues to have pain with ambulation.  She has pain at rest now on the bottom of her foot and ain with walking if she steps on the right side of her foot. She is still guarding foot with walking. She denies any new injury.   She is diabetic and is treated at her primary care.  She has taken Motrin as needed but has not taken on a scheduled basis as she reports " I do not like taking medications". She as done RICE and has not noticed much improvement since last visit in office.    Review of Systems  Constitutional: Negative.   HENT: Negative.   Eyes: Negative.   Respiratory: Negative.   Cardiovascular: Negative.   Gastrointestinal: Negative.   Endocrine: Negative.   Genitourinary: Negative.  Negative for difficulty urinating, dyspareunia, dysuria and flank pain.  Musculoskeletal: Positive for gait problem (continues to limp on right foot and guards ) and joint swelling (right lateral malleolous ). Negative for arthralgias, back pain, myalgias, neck pain and neck stiffness.  Skin: Negative.   Allergic/Immunologic: Negative for environmental allergies, food allergies and immunocompromised state.  Neurological: Negative for dizziness, seizures, syncope, facial asymmetry, speech difficulty, weakness, light-headedness, numbness and headaches.  Hematological: Negative for adenopathy. Does not bruise/bleed easily.  Psychiatric/Behavioral: Negative for agitation, behavioral problems, confusion, decreased concentration, dysphoric mood, hallucinations, self-injury, sleep disturbance and suicidal ideas. The patient is not nervous/anxious and is not hyperactive.        Objective:   Physical Exam  Constitutional: She  appears well-developed and well-nourished. She is active.  HENT:  Head: Normocephalic and atraumatic.  Eyes: Pupils are equal, round, and reactive to light. Conjunctivae and EOM are normal.  Neck: Normal range of motion. Neck supple.  Cardiovascular: Normal rate and regular rhythm.   Pulmonary/Chest: Effort normal and breath sounds normal. No respiratory distress. She has no wheezes. She has no rales. She exhibits no tenderness.  Abdominal: Soft.  Musculoskeletal:       Right shoulder: She exhibits decreased range of motion (decreased lateral and medial rotation ), tenderness, swelling (1 + ankle edema ) and pain (with palpitation of lateral malleollus and with ambullation. She also reports mild pain with sitting and reports this is new. ). She exhibits no bony tenderness, no effusion, no crepitus, no deformity, no laceration, no spasm and normal pulse.  Neurological: She is alert. She has normal reflexes.  Skin: Skin is warm and dry. No rash noted. No pallor.  Psychiatric: She has a normal mood and affect. Her behavior is normal. Judgment and thought content normal.       Assessment:     1. Right ankle pain that is not resolving with RICE, Motrin for two weeks.     X-ray result on 11/17/16  DG Ankle Complete Right [[id number]] Resulted: 11/17/16 1128  Order Status: Completed Updated: 11/17/16 1149  Narrative:   CLINICAL DATA: 31 year old female twisted right ankle last night. Pain and swelling laterally. Initial encounter.  EXAM: RIGHT ANKLE - COMPLETE 3+ VIEW  COMPARISON: None.  FINDINGS: Lateral malleolar soft tissue swelling consistent with soft tissue injury.  No discrete fracture noted. On oblique view, trabecular  felt to be responsible for lucency rather than fracture.  Plantar spur.  Small spur anterior distal talus consistent with degenerative changes.  IMPRESSION: Soft tissue swelling lateral malleolar region consistent with soft tissue injury without  underlying fracture or dislocation.  Plantar spur.   Electronically Signed By: Lacy Duverney M.D. On: 11/17/2016 11:28   DG Ankle Complete Right [[id number]] Resulted: 11/17/16 1128  Order Status: Completed Updated: 11/17/16 1149  Narrative:   CLINICAL DATA: 31 year old female twisted right ankle last night. Pain and swelling laterally. Initial encounter.  EXAM: RIGHT ANKLE - COMPLETE 3+ VIEW  COMPARISON: None.  FINDINGS: Lateral malleolar soft tissue swelling consistent with soft tissue injury.  No discrete fracture noted. On oblique view, trabecular felt to be responsible for lucency rather than fracture.  Plantar spur.  Small spur anterior distal talus consistent with degenerative changes.  IMPRESSION: Soft tissue swelling lateral malleolar region consistent with soft tissue injury without underlying fracture or dislocation.  Plantar spur.   Electronically Signed By: Lacy Duverney M.D. On: 11/17/2016 11:28     Plan:     1. Offered repeat x-ray today and or orthopedics referral. Patient advised she can go to [facility] walk in from 1 to 7 pm Monday through Friday if symptoms change or worsen and reminded of using [facility] due to benefit  contract with [facility] to reduce her out of pocket cost.  She is advised to go to the emergency room/ URGENT CARE  if any symptoms change or worsen.  Patient prefers orthopedic referral. She is advised to call if she does not hear from orthopedics within one week or if she worsens in symptoms.  She is advised to continue RICE and Motrin and use crutches until seen by orthopedics , and will return to clinic on 12/01/16 for follow up to see if improvement. Note given for work until return appointment.  Patient verbalized understanding.     Patient verbalized understanding of all instructions above.

## 2016-11-28 ENCOUNTER — Telehealth: Payer: Self-pay | Admitting: Family Medicine

## 2016-11-28 ENCOUNTER — Telehealth: Payer: Self-pay

## 2016-11-28 NOTE — Telephone Encounter (Signed)
Pt called and stated that she received a no show fee for 10/07/16. Pt stated that when she called and cancelled that he brother was very sick and in the hospital. Pt states that her brother actually passed away. Please advise, thank you!

## 2016-11-28 NOTE — Telephone Encounter (Signed)
See telephone comment. 

## 2016-12-01 ENCOUNTER — Encounter: Payer: Self-pay | Admitting: Medical

## 2016-12-01 ENCOUNTER — Ambulatory Visit: Payer: Self-pay | Admitting: Medical

## 2016-12-01 VITALS — BP 130/90 | HR 116 | Temp 97.0°F | Resp 16

## 2016-12-01 DIAGNOSIS — M25571 Pain in right ankle and joints of right foot: Secondary | ICD-10-CM

## 2016-12-01 NOTE — Progress Notes (Signed)
   Subjective:    Patient ID: Allison [name], female    DOB: 10/08/1985, 31 y.o.   MRN: [id number]  HPI 31 yo female comes in today for  Completion of FMLA paperwork, last worked on 7/5 /18  Injury occurred on 11/16/16 and seen in clinic on  11/17/16 again on 11/20/16 and again on 11/28/16. With follow up on 12/01/16.  Patient did bring back crutches, She states it feels a lot better, hurts worse with sitting  1-2/10.  With standing the lateral edge of the foot hurts.    Review of Systems  Constitutional: Negative for chills and fever.  HENT: Negative for congestion, ear pain and sore throat.   Eyes: Negative for discharge and itching.  Respiratory: Negative for cough and shortness of breath.   Cardiovascular: Negative for chest pain.  Gastrointestinal: Negative for abdominal pain.  Endocrine: Negative for polydipsia, polyphagia and polyuria.  Genitourinary: Negative for dysuria and hematuria.  Musculoskeletal: Positive for gait problem and joint swelling.  Skin: Negative for color change and rash.  Allergic/Immunologic: Negative for environmental allergies and food allergies.  Neurological: Negative for dizziness, syncope and light-headedness.  Hematological: Negative for adenopathy.  Psychiatric/Behavioral: Negative for behavioral problems, confusion and hallucinations. The patient is not nervous/anxious.    Pain with walking and  lateral Malleolus is painful too.    Back on Tulicity and now on Insulin per her primary doctor. Objective:   Physical Exam  Constitutional: She is oriented to person, place, and time. She appears well-developed and well-nourished.  HENT:  Head: Normocephalic and atraumatic.  Eyes: Pupils are equal, round, and reactive to light. Conjunctivae and EOM are normal.  Musculoskeletal: She exhibits edema and tenderness.  Neurological: She is alert and oriented to person, place, and time.  Skin: Skin is dry.  Nursing note and vitals reviewed.   Right ankle  tender posterior and under lateral malleolus with mild edema. Non tender to the lateral side of foot. Rewrapped with ace wrap for support and compression. ( new wrap).   Patient walking slowly and gingerly.      Assessment & Plan:   Completed FMLA paperwork, due to absences at work.  Ankle Pain and RICE ( reviewed with patient). Patient returned crutches today , she says they were giving her bruising under her arms and I reviewed with patient how to use the crutches properly. She says she really does not want them.  Reviewed with patient to use Ace wrap or support and compression to help with swelling. Demonstrated on how to wrap ankle/foot a second time. Pending appointment with orthopedics. Return to the clinic as needed.

## 2016-12-01 NOTE — Patient Instructions (Signed)
Ankle Pain Many things can cause ankle pain, including an injury to the area and overuse of the ankle.The ankle joint holds your body weight and allows you to move around. Ankle pain can occur on either side or the back of one ankle or both ankles. Ankle pain may be sharp and burning or dull and aching. There may be tenderness, stiffness, redness, or warmth around the ankle. Follow these instructions at home: Activity  Rest your ankle as told by your health care provider. Avoid any activities that cause ankle pain.  Do exercises as told by your health care provider.  Ask your health care provider if you can drive. Using a brace, a bandage, or crutches  If you were given a brace: ? Wear it as told by your health care provider. ? Remove it when you take a bath or a shower. ? Try not to move your ankle very much, but wiggle your toes from time to time. This helps to prevent swelling.  If you were given an elastic bandage: ? Remove it when you take a bath or a shower. ? Try not to move your ankle very much, but wiggle your toes from time to time. This helps to prevent swelling. ? Adjust the bandage to make it more comfortable if it feels too tight. ? Loosen the bandage if you have numbness or tingling in your foot or if your foot turns cold and blue.  If you have crutches, use them as told by your health care provider. Continue to use them until you can walk without feeling pain in your ankle. Managing pain, stiffness, and swelling  Raise (elevate) your ankle above the level of your heart while you are sitting or lying down.  If directed, apply ice to the area: ? Put ice in a plastic bag. ? Place a towel between your skin and the bag. ? Leave the ice on for 20 minutes, 2-3 times per day. General instructions  Keep all follow-up visits as told by your health care provider. This is important.  Record this information that may be helpful for you and your health care provider: ? How  often you have ankle pain. ? Where the pain is located. ? What the pain feels like.  Take over-the-counter and prescription medicines only as told by your health care provider. Contact a health care provider if:  Your pain gets worse.  Your pain is not relieved with medicines.  You have a fever or chills.  You are having more trouble with walking.  You have new symptoms. Get help right away if:  Your foot, leg, toes, or ankle tingles or becomes numb.  Your foot, leg, toes, or ankle becomes swollen.  Your foot, leg, toes, or ankle turns pale or blue. This information is not intended to replace advice given to you by your health care provider. Make sure you discuss any questions you have with your health care provider. Document Released: 10/16/2009 Document Revised: 12/28/2015 Document Reviewed: 11/28/2014 Elsevier Interactive Patient Education  2017 [employer]  

## 2016-12-04 NOTE — Progress Notes (Deleted)
Cardiology Office Note  Date:  12/04/2016   [name] DOB 1985/10/21, MRN [id number]  PCP:  Tommie Samsook, Jayce G, DO   No chief complaint on file.   HPI:   Ankle pain Morbid obesity Hyperlipidemia Essential hypertension Type 2 diabetes, blood sugar greater than 230 Smoker Medication noncompliance  Brother passed away   PMH:   has a past medical history of Diabetes mellitus without complication (HCC).  PSH:    Past Surgical History:  Procedure Laterality Date  . CHOLECYSTECTOMY  2003  . TUBAL LIGATION      Current Outpatient Prescriptions  Medication Sig Dispense Refill  . Dulaglutide (TRULICITY) 1.5 MG/0.5ML SOPN Inject 1.5 mg into the skin once a week. 4 pen 3  . glucose blood test strip Use as instructed 100 each 11  . hydrochlorothiazide (HYDRODIURIL) 25 MG tablet Take 1 tablet (25 mg total) by mouth daily. 90 tablet 3  . Insulin Glargine (BASAGLAR KWIKPEN) 100 UNIT/ML SOPN Inject 0.1 mLs (10 Units total) into the skin at bedtime. 1 pen 0  . Insulin Pen Needle (BD PEN NEEDLE NANO U/F) 32G X 4 MM MISC Use daily to inject insulin. 100 each 11  . Lancets (ACCU-CHEK MULTICLIX) lancets Use as instructed 50 each 0   No current facility-administered medications for this visit.      Allergies:   Metformin and related   Social History:  The patient  reports that she has been smoking Cigarettes.  She started smoking about 10 years ago. She has a 10.00 pack-year smoking history. She has never used smokeless tobacco. She reports that she drinks alcohol. She reports that she does not use drugs.   Family History:   family history includes Cancer (age of onset: 1849) in her mother; Diabetes in her maternal grandfather, mother, paternal aunt, and paternal aunt; Multiple sclerosis (age of onset: 5438) in her father.    Review of Systems: ROS   PHYSICAL EXAM: VS:  LMP 11/20/2016  , BMI There is no height or weight on file to calculate BMI. GEN: Well nourished, well  developed, in no acute distress HEENT: normal Neck: no JVD, carotid bruits, or masses Cardiac: RRR; no murmurs, rubs, or gallops,no edema  Respiratory:  clear to auscultation bilaterally, normal work of breathing GI: soft, nontender, nondistended, + BS MS: no deformity or atrophy Skin: warm and dry, no rash Neuro:  Strength and sensation are intact Psych: euthymic mood, full affect    Recent Labs: 07/07/2016: ALT 13; BUN 12; Creatinine 0.7; Hemoglobin 14.4; Platelets 341; Potassium 4.1; Sodium 134; TSH 1.24    Lipid Panel Lab Results  Component Value Date   CHOL 182 07/07/2016   HDL 27 (A) 07/07/2016   LDLCALC 90 07/24/2014   TRIG 530 (A) 07/07/2016      Wt Readings from Last 3 Encounters:  11/26/16 213 lb 3.2 oz (96.7 kg)  11/20/16 210 lb (95.3 kg)  11/17/16 210 lb (95.3 kg)       ASSESSMENT AND PLAN:  No diagnosis found.   Disposition:   F/U  6 months  No orders of the defined types were placed in this encounter.    Signed, Dossie Arbourim Gollan, M.D., Ph.D. 12/04/2016  [facility], [place] [phone]

## 2016-12-05 ENCOUNTER — Ambulatory Visit: Payer: BLUE CROSS/BLUE SHIELD | Admitting: Cardiovascular Disease

## 2016-12-17 ENCOUNTER — Other Ambulatory Visit: Payer: Self-pay | Admitting: Family Medicine

## 2016-12-17 DIAGNOSIS — S93401A Sprain of unspecified ligament of right ankle, initial encounter: Secondary | ICD-10-CM | POA: Diagnosis not present

## 2016-12-19 MED ORDER — BASAGLAR KWIKPEN 100 UNIT/ML ~~LOC~~ SOPN: 10.0000 [IU] | PEN_INJECTOR | Freq: Every day | SUBCUTANEOUS | 0 refills | Status: DC

## 2016-12-23 MED ORDER — BASAGLAR KWIKPEN 100 UNIT/ML ~~LOC~~ SOPN: 12.0000 [IU] | PEN_INJECTOR | Freq: Every day | SUBCUTANEOUS | 3 refills | Status: DC

## 2016-12-23 NOTE — Telephone Encounter (Addendum)
Patient states she is using Basaglar 12 units .  Appointment scheduled with PharmD.

## 2016-12-23 NOTE — Addendum Note (Signed)
Addended by: Alisia FerrariBARE, KRISTEN C on: 12/23/2016 03:24 PM   Modules accepted: Orders

## 2016-12-23 NOTE — Telephone Encounter (Signed)
Pt has requested to have this script refilled (basglar)  Pharmacy [facility] on [address]

## 2016-12-29 ENCOUNTER — Ambulatory Visit (INDEPENDENT_AMBULATORY_CARE_PROVIDER_SITE_OTHER): Payer: BLUE CROSS/BLUE SHIELD | Admitting: Pharmacist

## 2016-12-29 ENCOUNTER — Encounter: Payer: Self-pay | Admitting: Pharmacist

## 2016-12-29 VITALS — BP 163/102 | HR 82 | Wt 207.4 lb

## 2016-12-29 DIAGNOSIS — Z9119 Patient's noncompliance with other medical treatment and regimen: Secondary | ICD-10-CM | POA: Diagnosis not present

## 2016-12-29 DIAGNOSIS — Z794 Long term (current) use of insulin: Secondary | ICD-10-CM

## 2016-12-29 DIAGNOSIS — I10 Essential (primary) hypertension: Secondary | ICD-10-CM | POA: Diagnosis not present

## 2016-12-29 DIAGNOSIS — Z91199 Patient's noncompliance with other medical treatment and regimen due to unspecified reason: Secondary | ICD-10-CM

## 2016-12-29 DIAGNOSIS — E119 Type 2 diabetes mellitus without complications: Secondary | ICD-10-CM

## 2016-12-29 MED ORDER — FREESTYLE LIBRE SENSOR SYSTEM MISC: 3 refills | Status: DC

## 2016-12-29 MED ORDER — FREESTYLE LIBRE READER DEVI
1.0000 | Freq: Once | 0 refills | Status: AC
Start: 2016-12-29 — End: 2016-12-29

## 2016-12-29 MED ORDER — EMPAGLIFLOZIN 10 MG PO TABS: 10.0000 mg | ORAL_TABLET | Freq: Every day | ORAL | 3 refills | Status: DC

## 2016-12-29 MED ORDER — INSULIN GLARGINE 100 UNIT/ML SOLOSTAR PEN: 12.0000 [IU] | PEN_INJECTOR | Freq: Every day | SUBCUTANEOUS | 11 refills | Status: DC

## 2016-12-29 NOTE — Assessment & Plan Note (Signed)
Hypertension longstanding currently uncontrolled with BP 162/102 today.  Patient denies adherence with medication. Control is suboptimal due to medication non-adherence. -Continue HCTZ, counseled on adherence -Jardiance should also contribute to BP control

## 2016-12-29 NOTE — Patient Instructions (Addendum)
Thanks for coming to see Allison [name] today! Here are the medication changes we discussed today:  1. Keep taking your Basaglar insulin injections until you run out, then go pick up your prescription for Lantus insulin injections at your pharmacy.  2. START Jardiance 10 mg tablet once daily for your diabetes. Do not take this if you are sick or dehydrated.  3. Please remember to check your blood sugar once a day when you first wake up in the evening.   4. I sent a prescription for a Freestyle Libre CGM to your pharmacy. Bring this in to our next appointment and I will show you how to use it.  5. Remember to keep taking your hydrochlorothiazide 25mg  tablet once daily for your blood pressure.    Please come back to see Allison [name] in 2 weeks. Feel free to contact me [place]) at [phone] if you have any questions or concerns.

## 2016-12-29 NOTE — Assessment & Plan Note (Signed)
Following discussion and approval by Dr. Birdie Sons, the following medication changes were made:  - Continue Basaglar 12 units at present, sent Rx for lantus at equivalent dosing as basaglar is not preferred on her insurance and lantus is. -Start Jardiance 10 mg once daily. Counseled on sick day rules.  -Counseled on dietary modifications and exercise goals - goal is to start back on stepper once/week -Sent Rx for [employer] personal system to pharmacy - will follow up on affordability.  -Paper Rx for A1C, CMET, lipid panel hand delivered to pt as she gets these checked at work.

## 2016-12-29 NOTE — Progress Notes (Signed)
    S:     Chief Complaint  Patient presents with  . Medication Management    Diabetes    Patient arrives ambulating without assistance.  Presents for diabetes evaluation, education, and management at the request of Dr. Adriana Simas. Patient was referred on 11/26/2016.  Patient was last seen by Primary Care Provider on 11/26/2016. At that time, Trulicity was restarted and basaglar 10 units daily was initiated. She is also being seen for concerns for medication noncompliance.   Current smoker (about 1PPD), smokes at work while working third shift at [employer], husband smokes as well. Has very busy life with 31 yo and 56 yo children. Gets labs drawn at work. Not checking CBGs as just moved and lost BG meter, but has found it in the last few days. Denies h/o recent UTIs or yeast infections (last yeast infection was 5 yrs ago)  Patient denies adherence with medications: Missed last 2 days of HCTZ  Current diabetes medications include: Trulicity 1.5 mg weekly (Wednesday), basaglar 12 units daily  Current hypertension medications include: HCTZ 25 mg daily .Missed 2 doses since the move, havne't taken it in 2 days  Patient denies hypoglycemic events.  Patient reported dietary habits: Eats 2-3 meals/day. Breakfast: pork chop with carrots and cheesy rice Dinner: chili, rice, onions, cheese Snacks: fruits (bananas, apples), popcorn  Patient reported exercise habits: none at present, has used stair stepper at gym Patient reports nocturia. 1-2x a night Patient reports neuropathy. Endorses in hands Patient denies visual changes.  Patient denies self foot exams.  O:  Physical Exam  Constitutional: She appears well-developed and well-nourished.   Review of Systems  All other systems reviewed and are negative.  Lab Results  Component Value Date   HGBA1C 6.1 08/17/2015   Vitals:   12/29/16 1611  BP: (!) 163/102  Pulse: 82    Home fasting CBG: does not check 2 hour post-prandial CBG: 154 Random CBG:  170  A/P: Diabetes longstanding currently unable to assess control, seems that post-prandials have been controlled on checks. Patient denies hypoglycemic events and is able to verbalize appropriate hypoglycemia management plan. Patient reports adherence with medication recently. Control is suboptimal due to infrequent BG checks not allowing her to titrate meds, sedentary lifestyle, dietary indiscretion. Following discussion and approval by Dr. Birdie Sons, the following medication changes were made:  - Continue Basaglar 12 units at present, sent Rx for lantus at equivalent dosing as basaglar is not preferred on her insurance and lantus is. -Start Jardiance 10 mg once daily. Counseled on sick day rules.  -Counseled on dietary modifications and exercise goals - goal is to start back on stepper once/week -Sent Rx for [employer] personal system to pharmacy - will follow up on affordability.  -Paper Rx for A1C, CMET, lipid panel hand delivered to pt as she gets these checked at work.  Hypertension longstanding currently uncontrolled with BP 162/102 today.  Patient denies adherence with medication. Control is suboptimal due to medication non-adherence. -Continue HCTZ, counseled on adherence -Jardiance should also contribute to BP control  Patient was seen with Dr. Birdie Sons today in clinic and medication changes were discussed and approved prior to initiation Written patient instructions provided.  Total time in face to face counseling 60 minutes.   Follow up in Pharmacist Clinic Visit 2 weeks.   Patient seen with Katherine Mantle, PharmD Candidate.

## 2016-12-31 ENCOUNTER — Other Ambulatory Visit: Payer: Self-pay

## 2016-12-31 DIAGNOSIS — E785 Hyperlipidemia, unspecified: Secondary | ICD-10-CM

## 2016-12-31 DIAGNOSIS — E119 Type 2 diabetes mellitus without complications: Secondary | ICD-10-CM

## 2016-12-31 DIAGNOSIS — Z794 Long term (current) use of insulin: Secondary | ICD-10-CM

## 2017-01-01 LAB — COMPREHENSIVE METABOLIC PANEL
A/G RATIO: 1.9 (ref 1.2–2.2)
ALK PHOS: 83 IU/L (ref 39–117)
ALT: 31 IU/L (ref 0–32)
AST: 16 IU/L (ref 0–40)
Albumin: 5.2 g/dL (ref 3.5–5.5)
BILIRUBIN TOTAL: 0.3 mg/dL (ref 0.0–1.2)
BUN/Creatinine Ratio: 15 (ref 9–23)
BUN: 14 mg/dL (ref 6–20)
CHLORIDE: 98 mmol/L (ref 96–106)
CO2: 23 mmol/L (ref 20–29)
Calcium: 10 mg/dL (ref 8.7–10.2)
Creatinine, Ser: 0.91 mg/dL (ref 0.57–1.00)
GFR calc Af Amer: 97 mL/min/{1.73_m2} (ref >59)
GFR calc non Af Amer: 84 mL/min/{1.73_m2} (ref >59)
GLUCOSE: 116 mg/dL — AB (ref 65–99)
Globulin, Total: 2.7 g/dL (ref 1.5–4.5)
POTASSIUM: 4 mmol/L (ref 3.5–5.2)
Sodium: 140 mmol/L (ref 134–144)
Total Protein: 7.9 g/dL (ref 6.0–8.5)

## 2017-01-01 LAB — HGB A1C W/O EAG: Hgb A1c MFr Bld: 8.7 % — ABNORMAL HIGH (ref 4.8–5.6)

## 2017-01-01 LAB — LIPID PANEL W/O CHOL/HDL RATIO
CHOLESTEROL TOTAL: 197 mg/dL (ref 100–199)
HDL: 31 mg/dL — AB (ref >39)
Triglycerides: 420 mg/dL — ABNORMAL HIGH (ref 0–149)

## 2017-01-26 ENCOUNTER — Ambulatory Visit: Payer: BLUE CROSS/BLUE SHIELD | Admitting: Pharmacist

## 2017-02-09 ENCOUNTER — Ambulatory Visit (INDEPENDENT_AMBULATORY_CARE_PROVIDER_SITE_OTHER): Payer: BLUE CROSS/BLUE SHIELD | Admitting: Pharmacist

## 2017-02-09 DIAGNOSIS — I10 Essential (primary) hypertension: Secondary | ICD-10-CM

## 2017-02-09 DIAGNOSIS — Z794 Long term (current) use of insulin: Secondary | ICD-10-CM | POA: Diagnosis not present

## 2017-02-09 DIAGNOSIS — E119 Type 2 diabetes mellitus without complications: Secondary | ICD-10-CM | POA: Diagnosis not present

## 2017-02-09 MED ORDER — LISINOPRIL 5 MG PO TABS: 5.0000 mg | ORAL_TABLET | Freq: Every day | ORAL | 3 refills | Status: DC

## 2017-02-09 MED ORDER — INSULIN GLARGINE 100 UNIT/ML SOLOSTAR PEN: 18.0000 [IU] | PEN_INJECTOR | Freq: Every day | SUBCUTANEOUS | 11 refills | Status: DC

## 2017-02-09 NOTE — Progress Notes (Signed)
S:     Chief Complaint  Patient presents with  . Medication Management    Diabetes, HTN, med nonadherence    Patient arrives ambulating without assistance.  Presents for diabetes evaluation, education, and management at the request of Dr. Adriana Simas. Patient was referred on 11/26/2016.  Patient was last seen by Primary Care Provider on 11/26/2016. Last seen in Rx clinic on 12/29/2016. At that time, Mariella Saa was switched to lantus (formulary switch) and jardiance was started. Rx for CGM also sent to pharmacy.   Current smoker (about 1PPD), smokes at work while working third shift at [employer], husband smokes as well. Has very busy life with 31 yo and 42 yo children. Gets labs drawn at work. Doing better checking CBGs, running in the 130s, denies hypoglycemia but some relative hypoglycemia - has since resolved. Reports she was able to pick up Jardiance with a coupon, could not afford CGM at this time. Got one yeast infection last week, treated with OTC meds and has resolved. Has not taken Jardiance since then but is amenable to restarting.   Has had tubes tied, not planning on having more children. Gets labs done at work for free.  Patient reports adherence with medications. Has been doing much better.  Current diabetes medications include: Jardiance 10 mg daily, lantus 16 units daily, Trulicity 1.5 mg weekly Freestyle libre personal system Current hypertension medications include: HCTZ 25 mg daily  Patient denies hypoglycemic events.  Patient reported dietary habits: Eats 2-3 meals/day. Breakfast: pork chop with carrots and cheesy rice Dinner: chili, rice, onions, cheese Snacks: fruits (bananas, apples), popcorn  Patient reported exercise habits: has not been able to implement changes with inclement weather  Patient reported exercise habits: none at present, did better the last two weeks and went twice to the gym. Patient reports nocturia. 1-2x a night Patient reports neuropathy. Endorses in  hands - worsening and notices first thing in the morning  Patient denies visual changes.  Patient reports self foot exams.  O:  Physical Exam  Constitutional: She appears well-developed and well-nourished.  Vitals reviewed.   Review of Systems  Respiratory: Positive for cough.     Lab Results  Component Value Date   HGBA1C 8.7 (H) 12/31/2016   Vitals:   02/09/17 1301  BP: (!) 165/108  Pulse: 99    Home fasting CBG:  130s, highest = 142, lowest = 126  A/P: Diabetes longstanding currently with improved control per CBG report. Patient denies hypoglycemic events and is able to verbalize appropriate hypoglycemia management plan. Patient reports adherence with medication. Control is suboptimal due to insulin resistance. Following discussion and approval by Dr. Birdie Sons, the following medication changes were made:  -Restart Jardiance 10 mg once daily - stay hydrated. Counseled on sick day rules. I am comfortable restarting with only one yeast infection. Will reconsider if yeast infections recur.  -Increase lantus to 18 units once daily -Continued Trulicity 1.5 mg weekly.  -CGM unaffordable at this time, will continue checking CBGs by finger stick. Next A1C anticipated 04/02/2017 or later.     Hypertension longstanding remains uncontrolled.  Patient reports adherence with medication. Control is suboptimal due to stress, unoptimized medication regimen. -Start lisinopril 5 mg daily. Patient educated on purpose, proper use and potential adverse effects of lisinopril.  Following instruction patient verbalized understanding of treatment plan.  -Hard copy Rx for BMET given to patient. Instructed to get BMET 7-10 days after starting lisinopril. -Continue HCTZ, counseled on adherence -Jardiance should also contribute to  BP control   Patient was seen with Dr. Birdie Sons today in clinic and medication changes were discussed and approved prior to initiation Written patient instructions  provided.  Total time in face to face counseling 35 minutes.   Follow up in Pharmacist Clinic Visit 4 weeks.   Allena Katz, Pharm.D. PGY2 Ambulatory Care Pharmacy Resident Phone: [phone]

## 2017-02-09 NOTE — Patient Instructions (Signed)
Thanks for coming to see me!   1. INCREASE your lantus to 18 units once a day. We are shooting for blood sugars first thing in the morning in the low 100s.   2. Continue your Jardiance 10 mg once a day. Stay hydrated and if you get sick/throwing up/dehydrated - do not take this medication.   3. START lisinopril 5 mg once a day. This was sent to your pharmacy.   4. Continue taking HCTZ 25 mg once a day.   Come back in 1-2 weeks to have your blood drawn and talk about your diabetes.

## 2017-02-09 NOTE — Assessment & Plan Note (Signed)
Diabetes longstanding currently with improved control per CBG report. Patient denies hypoglycemic events and is able to verbalize appropriate hypoglycemia management plan. Patient reports adherence with medication. Control is suboptimal due to insulin resistance. Following discussion and approval by Dr. Birdie Sons, the following medication changes were made:  -Restart Jardiance 10 mg once daily - stay hydrated. Counseled on sick day rules. I am comfortable restarting with only one yeast infection. Will reconsider if yeast infections recur.  -Increase lantus to 18 units once daily -Continued Trulicity 1.5 mg weekly.  -CGM unaffordable at this time, will continue checking CBGs by finger stick. Next A1C anticipated 04/02/2017 or later.

## 2017-02-09 NOTE — Assessment & Plan Note (Signed)
Hypertension longstanding remains uncontrolled.  Patient reports adherence with medication. Control is suboptimal due to stress, unoptimized medication regimen. -Start lisinopril 5 mg daily. Patient educated on purpose, proper use and potential adverse effects of lisinopril.  Following instruction patient verbalized understanding of treatment plan.  -Hard copy Rx for BMET given to patient. Instructed to get BMET 7-10 days after starting lisinopril. -Continue HCTZ, counseled on adherence -Jardiance should also contribute to BP control

## 2017-02-14 NOTE — Progress Notes (Signed)
I have reviewed the above note and agree. Patient was seen in the office with the pharmacist.  Marikay Alar, M.D.

## 2017-02-24 ENCOUNTER — Other Ambulatory Visit: Payer: Self-pay

## 2017-02-24 DIAGNOSIS — I10 Essential (primary) hypertension: Secondary | ICD-10-CM

## 2017-02-25 LAB — BASIC METABOLIC PANEL
BUN / CREAT RATIO: 19 (ref 9–23)
BUN: 15 mg/dL (ref 6–20)
CALCIUM: 9.7 mg/dL (ref 8.7–10.2)
CO2: 22 mmol/L (ref 20–29)
Chloride: 103 mmol/L (ref 96–106)
Creatinine, Ser: 0.77 mg/dL (ref 0.57–1.00)
GFR, EST AFRICAN AMERICAN: 119 mL/min/{1.73_m2} (ref >59)
GFR, EST NON AFRICAN AMERICAN: 103 mL/min/{1.73_m2} (ref >59)
Glucose: 106 mg/dL — ABNORMAL HIGH (ref 65–99)
POTASSIUM: 4.3 mmol/L (ref 3.5–5.2)
Sodium: 139 mmol/L (ref 134–144)

## 2017-04-14 ENCOUNTER — Ambulatory Visit: Payer: BLUE CROSS/BLUE SHIELD | Admitting: Family Medicine

## 2017-04-14 ENCOUNTER — Other Ambulatory Visit: Payer: Self-pay

## 2017-04-14 ENCOUNTER — Encounter: Payer: Self-pay | Admitting: Family Medicine

## 2017-04-14 DIAGNOSIS — Z794 Long term (current) use of insulin: Secondary | ICD-10-CM

## 2017-04-14 DIAGNOSIS — E119 Type 2 diabetes mellitus without complications: Secondary | ICD-10-CM

## 2017-04-14 DIAGNOSIS — G5603 Carpal tunnel syndrome, bilateral upper limbs: Secondary | ICD-10-CM | POA: Diagnosis not present

## 2017-04-14 DIAGNOSIS — I10 Essential (primary) hypertension: Secondary | ICD-10-CM

## 2017-04-14 DIAGNOSIS — G5600 Carpal tunnel syndrome, unspecified upper limb: Secondary | ICD-10-CM | POA: Insufficient documentation

## 2017-04-14 MED ORDER — WRIST SPLINT/COCK-UP/RIGHT M MISC: 0 refills | Status: DC

## 2017-04-14 MED ORDER — WRIST SPLINT/COCK-UP/LEFT M MISC: 0 refills | Status: DC

## 2017-04-14 NOTE — Progress Notes (Signed)
Allison AlarEric Latonyia Lopata, MD Phone: [phone]  Allison [name] is a 31 y.o. female who presents today for f/u.  Bilateral hand pain: Patient notes right greater than left hand pain for the last several months.  Left only recently started bothering her.  Notes they will feel puffy when she is holding something.  Notes numbness and tingling as well in her hands.  No symptoms proximal to her wrists.  She does repetitive motions as a custodian.  Does wake up with symptoms in her right hand at times.  No symptoms elsewhere.  Hypertension: Not checking at home.  She is only on lisinopril currently.  She stopped the HCTZ after her last visit with the pharmacist.  No chest pain, shortness of breath, or edema.  Diabetes: Taking Lantus and Trulicity.  She had yeast infections with Jardiance.  CBG on last check was 163.  No polyuria or polydipsia.  No hypoglycemia.  Reports she has seen ophthalmology.  Social History   Tobacco Use  Smoking Status Current Every Day Smoker  . Packs/day: 1.00  . Years: 10.00  . Pack years: 10.00  . Types: Cigarettes  . Start date: 05/14/2006  Smokeless Tobacco Never Used     ROS see history of present illness  Objective  Physical Exam Vitals:   04/14/17 0856  BP: (!) 156/100  Pulse: 96  Temp: 98.4 F (36.9 C)  SpO2: 98%    BP Readings from Last 3 Encounters:  04/14/17 (!) 156/100  02/09/17 (!) 165/108  12/29/16 (!) 163/102   Wt Readings from Last 3 Encounters:  04/14/17 211 lb 6.4 oz (95.9 kg)  02/09/17 204 lb 9.6 oz (92.8 kg)  12/29/16 207 lb 6.4 oz (94.1 kg)    Physical Exam  Constitutional: No distress.  Cardiovascular: Normal rate, regular rhythm and normal heart sounds.  Pulmonary/Chest: Effort normal and breath sounds normal.  Musculoskeletal:  Bilateral hands and wrists with no swelling or tenderness, no warmth or erythema, 5/5 grip strength bilaterally, positive Tinel's on the right, positive Phalen's on the right  Neurological: She is  alert. Gait normal.  Skin: Skin is warm and dry. She is not diaphoretic.     Assessment/Plan: Please see individual problem list.  Benign essential HTN Uncontrolled.  She discontinued the HCTZ on her own.  She will restart this.  We will check a BMP today or tomorrow and in 3-4 weeks.  She gets this done through the clinic at work and she was provided with written prescriptions for this.  Type 2 diabetes mellitus without complication, with long-term current use of insulin (HCC) Possibly improved control.  Suggested checking CBGs more frequently.  We will check an A1c.  Carpal tunnel syndrome Symptoms and exam consistent with this.  Starting to get symptoms in her left hand as well as her right.  We will place her in cockup splints.  She is provided with written prescriptions and advised to go to [facility] to get these filled.  If not improving over the next month consider referral to sports medicine or orthopedics.   Allison [name] was seen today for hand pain.  Diagnoses and all orders for this visit:  Benign essential HTN  Type 2 diabetes mellitus without complication, with long-term current use of insulin (HCC)  Bilateral carpal tunnel syndrome  Other orders -     Elastic Bandages & Supports (WRIST SPLINT/COCK-UP/LEFT M) MISC; Apply nightly to wrist -     Elastic Bandages & Supports (WRIST SPLINT/COCK-UP/RIGHT M) MISC; Apply nightly to wrist  No orders of the defined types were placed in this encounter.   Meds ordered this encounter  Medications  . Elastic Bandages & Supports (WRIST SPLINT/COCK-UP/LEFT M) MISC    Sig: Apply nightly to wrist    Dispense:  1 each    Refill:  0  . Elastic Bandages & Supports (WRIST SPLINT/COCK-UP/RIGHT M) MISC    Sig: Apply nightly to wrist    Dispense:  1 each    Refill:  0     Allison AlarEric Mabeline Varas, MD [facility] [place]

## 2017-04-14 NOTE — Assessment & Plan Note (Signed)
Uncontrolled.  She discontinued the HCTZ on her own.  She will restart this.  We will check a BMP today or tomorrow and in 3-4 weeks.  She gets this done through the clinic at work and she was provided with written prescriptions for this.

## 2017-04-14 NOTE — Assessment & Plan Note (Signed)
Possibly improved control.  Suggested checking CBGs more frequently.  We will check an A1c.

## 2017-04-14 NOTE — Assessment & Plan Note (Signed)
Symptoms and exam consistent with this.  Starting to get symptoms in her left hand as well as her right.  We will place her in cockup splints.  She is provided with written prescriptions and advised to go to [facility] to get these filled.  If not improving over the next month consider referral to sports medicine or orthopedics.

## 2017-04-14 NOTE — Patient Instructions (Signed)
Nice to see you. You likely have carpal tunnel syndrome. We will place you in splints nightly. [facility] [address], [place], [place] [id number].  Please start back on the hydrochlorothiazide.  Please get the lab work done today or tomorrow at [facility] and then have lab work done in 3-4 weeks again.  We will check an A1c as well.

## 2017-04-20 ENCOUNTER — Other Ambulatory Visit: Payer: Self-pay

## 2017-04-22 ENCOUNTER — Other Ambulatory Visit: Payer: Self-pay

## 2017-04-23 ENCOUNTER — Other Ambulatory Visit: Payer: Self-pay

## 2017-04-23 DIAGNOSIS — I10 Essential (primary) hypertension: Secondary | ICD-10-CM

## 2017-04-24 LAB — BASIC METABOLIC PANEL
BUN/Creatinine Ratio: 21 (ref 9–23)
BUN: 16 mg/dL (ref 6–20)
CALCIUM: 9.3 mg/dL (ref 8.7–10.2)
CHLORIDE: 104 mmol/L (ref 96–106)
CO2: 22 mmol/L (ref 20–29)
CREATININE: 0.76 mg/dL (ref 0.57–1.00)
GFR calc non Af Amer: 105 mL/min/{1.73_m2} (ref >59)
GFR, EST AFRICAN AMERICAN: 121 mL/min/{1.73_m2} (ref >59)
Glucose: 94 mg/dL (ref 65–99)
Potassium: 3.9 mmol/L (ref 3.5–5.2)
Sodium: 139 mmol/L (ref 134–144)

## 2017-04-24 LAB — HGB A1C W/O EAG: HEMOGLOBIN A1C: 7.2 % — AB (ref 4.8–5.6)

## 2017-05-17 NOTE — Progress Notes (Addendum)
S:     Chief Complaint  Patient presents with  . Medication Management    diabetes, HTN   Patient arrives in good spirits, ambulating without assistance.  Presents for hypertension and diabetes evaluation, education, and management at the request of Dr. Rockne Menghini. Birdie Sons (referred on 11/26/2016). Last seen by primary care provider on 04/14/2017 - at that time she was found to be nonadherent to HCTZ and Jardiance (yeast infections). HCTZ was restarted. F/u BMET stable and A1C found to be improved from 8.7 to 7.2.   Today, patient reports she is largely adherent to both lisinopril and HCTZ, taking both right before she goes to sleep, reports urination 2-3 times nightly. States she misses doses ~1-2x/week. Reports adherence to DM meds, stopped Jardiance and no recurrence of UTI/yeast infection sx reported.   Patient reports she was diagnosed with DM ~4 years ago after gestational DM with second child.   Smoking 5 cigs/day - patient's self-stated goal to stay at the same quantity or less/day, declines medication for smoking cessation at this time as she is wary of potential psych ADE.   Patient complains of swollen finger since before Christmas - states she ripped out the cuticle and has been "pushing on it" to make "green-yellow puss come out". Denies fevers. Has soaked in warm salt water a few times but no medical care sought.    Current diabetes medications include: lantus 20 units daily, Trulicity 1.5 mg weekly Current hypertension medications include: HCTZ 25 mg daily, lisinopril 5 mg daily  Patient denies hypoglycemic events.   O:  Physical Exam  Constitutional: She appears well-developed and well-nourished.  Skin:     Vitals reviewed.    Review of Systems  Constitutional: Negative for chills and fever.  Genitourinary: Negative for dysuria, frequency and urgency.     Lab Results  Component Value Date   HGBA1C 7.2 (H) 04/23/2017   Vitals:   05/18/17 0902  BP: (!)  164/111  Pulse: 84  Temp: 98.3 F (36.8 C)    Lipid Panel     Component Value Date/Time   CHOL 197 12/31/2016 0740   TRIG 420 (H) 12/31/2016 0740   HDL 31 (L) 12/31/2016 0740   LDLCALC Comment 12/31/2016 0740    Clinical ASCVD: No  ;High-risk conditions: DM, HTN, current smoking, ASCVD risk factors:persistently elevated TG >/= 175  A/P: #Diabetes longstanding currently with improved control per A1C and patient reported CBGs. Patient denies hypoglycemic events and is able to verbalize appropriate hypoglycemia management plan. Patient reports decent adherence with medication. Control is suboptimal due to dietary indiscretion, infrequent CBG checks, sedentary lifestyle, medication nonadherence (Jardiance). - Continue Lantus 20 units daily, Trulicity 1.5 mg weekly - d/c Jardiance and added to intolerance list - Next A1C anticipated 07/22/2016 or later   #ASCVD risk - patient aged 79-39 with DM duration <10 years + multiple CVD risk factors including HTN, DM, current smoking- may be candidate for statin therapy. Last lipid panel with elevated TG however patient had much poorer control of DM at that time.  - F/u fasting lipid panel at next visit. Consider initiation of statin if appropriate.   #Hypertension longstanding currently uncontrolled on current meds.  Patient reports adherence with medication. Control is suboptimal due to unoptimized ACE-I dosing. Last BMET stable after reinitiation of HCTZ.  - Increase lisinopril to 10 mg daily - Continue HCTZ 25 mg daily - F/u BMET through her work next week   #Tobacco use disorder - currently improved, down to  5 cigarettes/day. Goal made to smoke no more than 5/day. Patient contemplating quitting.  - Discussed NRT options and other pharmacologic options to help cessation - Counseled on importance of quitting - F/u at next visit   #Swollen, red, purulent index finger on left hand - Assessed by PCP, Please see accompanying note from Dr.  Birdie SonsSonnenberg -Clindamycin 300 mg four times daily prescribed.    Written patient instructions provided.  Total time in face to face counseling 25 minutes.    Follow up in Pharmacist Clinic Visit 2-4 weeks.   Patient seen with Karna DupesMedina Rasul, PharmD Candidate  Allena Katzaroline E Welles, Pharm.D., BCPS, CPP PGY2 Ambulatory Care Pharmacy Resident Phone: [phone]

## 2017-05-18 ENCOUNTER — Encounter: Payer: Self-pay | Admitting: Pharmacist

## 2017-05-18 ENCOUNTER — Ambulatory Visit (INDEPENDENT_AMBULATORY_CARE_PROVIDER_SITE_OTHER): Payer: BLUE CROSS/BLUE SHIELD | Admitting: Pharmacist

## 2017-05-18 DIAGNOSIS — I10 Essential (primary) hypertension: Secondary | ICD-10-CM | POA: Diagnosis not present

## 2017-05-18 DIAGNOSIS — L03012 Cellulitis of left finger: Secondary | ICD-10-CM

## 2017-05-18 MED ORDER — LISINOPRIL 5 MG PO TABS: 10.0000 mg | ORAL_TABLET | Freq: Every day | ORAL | 3 refills | Status: DC

## 2017-05-18 MED ORDER — CLINDAMYCIN HCL 300 MG PO CAPS: 300.0000 mg | ORAL_CAPSULE | Freq: Four times a day (QID) | ORAL | 0 refills | Status: DC

## 2017-05-18 NOTE — Assessment & Plan Note (Signed)
#  Hypertension longstanding currently uncontrolled on current meds.  Patient reports adherence with medication. Control is suboptimal due to unoptimized ACE-I dosing. Last BMET stable after reinitiation of HCTZ.  - Increase lisinopril to 10 mg daily - Continue HCTZ 25 mg daily - F/u BMET through her work next week

## 2017-05-18 NOTE — Progress Notes (Signed)
  Marikay AlarEric Carlene Bickley, MD Phone: [phone]  Allison [name] is a 32 y.o. female who presents today for same-day visit in association with pharmacy clinic visit.  Patient notes for the last several weeks she has had swelling along the ulnar aspect of her left index finger beside her fingernail consistent with paronychia.  She notes it was swollen up significantly previously though has improved a bit though still remains slightly swollen and erythematous.  Every couple of days it will swell up a little more and then drain purulent material.  No purulence today.  Notes no pain today.  Does have erythema.  No fevers.  She does note she tried to drain it on her own.  Social History   Tobacco Use  Smoking Status Current Every Day Smoker  . Packs/day: 1.00  . Years: 10.00  . Pack years: 10.00  . Types: Cigarettes  . Start date: 05/14/2006  Smokeless Tobacco Never Used     ROS see history of present illness  Objective  Physical Exam Vitals:   05/18/17 0902  BP: (!) 164/111  Pulse: 84  Temp: 98.3 F (36.8 C)    BP Readings from Last 3 Encounters:  05/18/17 (!) 164/111  04/14/17 (!) 156/100  02/09/17 (!) 165/108   Wt Readings from Last 3 Encounters:  05/18/17 214 lb (97.1 kg)  04/14/17 211 lb 6.4 oz (95.9 kg)  02/09/17 204 lb 9.6 oz (92.8 kg)    Physical Exam  Constitutional: She is well-developed, well-nourished, and in no distress.  Cardiovascular: Normal rate, regular rhythm and normal heart sounds.  Pulmonary/Chest: Effort normal and breath sounds normal.  Skin:  Swelling and erythema noted just adjacent to the left index fingernail, no fluctuance or tenderness, slight warmth, no elevated nailbed     Assessment/Plan:   Paronychia: Patient seen today in conjunction with the pharmacist.  I agree with her documentation.  Found to have paronychia on exam.  Concern for abscess given purulent drainage though there is no fluctuance or tenderness today.  I discussed having  her see a hand surgeon to consider incision and drainage though given that the patient has improved she deferred this and opted for a trial of antibiotic therapy.  Clindamycin prescribed given purulence.  Discussed taking a probiotic or eating yogurt while on this to limit diarrhea risk.  She will follow-up in 2 days for recheck.  If not improved will plan to refer to a hand surgeon.  If worsens prior to then she will seek medical attention at an urgent care.   Marikay AlarEric Simra Fiebig, MD [facility] [place]

## 2017-05-18 NOTE — Patient Instructions (Addendum)
Thanks for coming to see me!   Keep taking your insulin (Lantus 20 units daily) and Trulicity 1.5 mg weekly.   Increase your lisinopril to 10 mg daily. Continue your hydrochlorothiazide (HCTZ) 25 mg once a day. Try taking the HCTZ when you wake up and see if that makes a difference in how much you pee at night.   Have your labs (BMET) drawn next week. Make a follow up appointment with me in 2-4 weeks.   We will get Dr. Birdie SonsSonnenberg to look at your finger.

## 2017-05-18 NOTE — Assessment & Plan Note (Signed)
#  Tobacco use disorder - currently improved, down to 5 cigarettes/day. Goal made to smoke no more than 5/day. Patient contemplating quitting.  - Discussed NRT options and other pharmacologic options to help cessation - Counseled on importance of quitting - F/u at next visit

## 2017-05-18 NOTE — Assessment & Plan Note (Signed)
#  Diabetes longstanding currently with improved control per A1C and patient reported CBGs. Patient denies hypoglycemic events and is able to verbalize appropriate hypoglycemia management plan. Patient reports decent adherence with medication. Control is suboptimal due to dietary indiscretion, infrequent CBG checks, sedentary lifestyle, medication nonadherence (Jardiance). - Continue Lantus 20 units daily, Trulicity 1.5 mg weekly - d/c Jardiance and added to intolerance list - Next A1C anticipated 07/22/2016 or later   #ASCVD risk - patient aged 32-39 with DM duration <10 years + multiple CVD risk factors including HTN, DM, current smoking- may be candidate for statin therapy. Last lipid panel with elevated TG however patient had much poorer control of DM at that time.  - F/u fasting lipid panel at next visit. Consider initiation of statin if appropriate.

## 2017-05-18 NOTE — Addendum Note (Signed)
Addended by: Glori LuisSONNENBERG, Romy Ipock G on: 05/18/2017 06:27 PM   Modules accepted: Level of Service

## 2017-05-20 ENCOUNTER — Encounter: Payer: Self-pay | Admitting: Family Medicine

## 2017-05-20 ENCOUNTER — Ambulatory Visit: Payer: BLUE CROSS/BLUE SHIELD | Admitting: Family Medicine

## 2017-05-20 DIAGNOSIS — L03012 Cellulitis of left finger: Secondary | ICD-10-CM

## 2017-05-20 NOTE — Assessment & Plan Note (Signed)
Appears to have been improving with antibiotic therapy.  No recurrence of purulent drainage since being on antibiotics.  No purulence since 2-3 days prior to being on antibiotics.  Size is slightly decreased.  Slightly less erythema.  She will complete the course of antibiotics.  If worsens she will be reevaluated.  If not continuing to improve consider referral to hand surgeon.

## 2017-05-20 NOTE — Progress Notes (Signed)
  Marikay AlarEric Sonnenberg, MD Phone: [phone]  Allison [name] is a 32 y.o. female who presents today for follow-up.  Patient seen 2 days ago and noted to have paronychia.  She notes there has not been any drainage since then.  The swelling might be slightly improved.  Does note some mild soreness.  No fevers.  Social History   Tobacco Use  Smoking Status Current Every Day Smoker  . Packs/day: 1.00  . Years: 10.00  . Pack years: 10.00  . Types: Cigarettes  . Start date: 05/14/2006  Smokeless Tobacco Never Used     ROS see history of present illness  Objective  Physical Exam Vitals:   05/20/17 0951  BP: 130/90  Pulse: 97  Temp: 98.1 F (36.7 C)  SpO2: 97%    BP Readings from Last 3 Encounters:  05/20/17 130/90  05/18/17 (!) 164/111  04/14/17 (!) 156/100   Wt Readings from Last 3 Encounters:  05/20/17 211 lb 6.4 oz (95.9 kg)  05/18/17 214 lb (97.1 kg)  04/14/17 211 lb 6.4 oz (95.9 kg)    Physical Exam  Constitutional: She is well-developed, well-nourished, and in no distress.  Skin:  Left index finger ulnar aspect with slightly improved erythema, minimal soreness, no fluctuance, size is slightly decreased as well     Assessment/Plan: Please see individual problem list.  Paronychia of finger of left hand Appears to have been improving with antibiotic therapy.  No recurrence of purulent drainage since being on antibiotics.  No purulence since 2-3 days prior to being on antibiotics.  Size is slightly decreased.  Slightly less erythema.  She will complete the course of antibiotics.  If worsens she will be reevaluated.  If not continuing to improve consider referral to hand surgeon.   Yaremi was seen today for follow-up.  Diagnoses and all orders for this visit:  Paronychia of finger of left hand    No orders of the defined types were placed in this encounter.   No orders of the defined types were placed in this encounter.    Marikay AlarEric Sonnenberg,  MD [facility] [place]

## 2017-05-20 NOTE — Patient Instructions (Signed)
Nice to see you. Please finish the course of antibiotics.  Your finger should continue to get better.  If it worsens please be evaluated.  If it does not continue to improve after you finish the antibiotics please let us know.

## 2017-05-22 ENCOUNTER — Other Ambulatory Visit: Payer: Self-pay

## 2017-05-29 ENCOUNTER — Other Ambulatory Visit: Payer: Self-pay

## 2017-05-29 DIAGNOSIS — Z0000 Encounter for general adult medical examination without abnormal findings: Secondary | ICD-10-CM

## 2017-05-30 LAB — BASIC METABOLIC PANEL
BUN / CREAT RATIO: 18 (ref 9–23)
BUN: 14 mg/dL (ref 6–20)
CO2: 20 mmol/L (ref 20–29)
Calcium: 9.4 mg/dL (ref 8.7–10.2)
Chloride: 103 mmol/L (ref 96–106)
Creatinine, Ser: 0.79 mg/dL (ref 0.57–1.00)
GFR, EST AFRICAN AMERICAN: 115 mL/min/{1.73_m2} (ref >59)
GFR, EST NON AFRICAN AMERICAN: 100 mL/min/{1.73_m2} (ref >59)
Glucose: 121 mg/dL — ABNORMAL HIGH (ref 65–99)
Potassium: 3.6 mmol/L (ref 3.5–5.2)
SODIUM: 140 mmol/L (ref 134–144)

## 2017-06-01 ENCOUNTER — Ambulatory Visit: Payer: BLUE CROSS/BLUE SHIELD | Admitting: Pharmacist

## 2017-06-01 NOTE — Progress Notes (Unsigned)
    S:     No chief complaint on file.   Patient arrives ***.  Presents for diabetes evaluation, education, and management at the request of *** (referred on ***). Last seen by primary care provider on *** - at that time ***. Last Rx Clinic visit on 05/18/2017 - at that time blood pressure was found to be elevated so lisinopril was increased, DM medications were unchanged as glycemic control improved overall.   Today, patient reports she is *** tolerating increase in lisinopril. Denies CP, syncope, lightheadedness, fatigue.  Finger ***  Today, BMET and Lipid panel  Patient reports Diabetes was diagnosed in ***.   Family/Social History: ***  Insurance coverage/medication affordability: ***  Patient {Actions; denies-reports:120008} adherence with medications.  Current diabetes medications include: *** Current hypertension medications include: ***   Patient {Actions; denies-reports:120008} hypoglycemic events.  Patient reported dietary habits: Eats *** meals/day Breakfast:*** Lunch:*** Dinner:*** Snacks:*** Drinks:***  Patient reported exercise habits:    Patient {Actions; denies-reports:120008} nocturia.  Patient {Actions; denies-reports:120008} pain/burning on urination.  Patient {Actions; denies-reports:120008} neuropathy. Patient {Actions; denies-reports:120008} visual changes. Patient {Actions; denies-reports:120008} self foot exams.   Benedict [email or web address]   O:  Physical Exam   ROS   Lab Results  Component Value Date   HGBA1C 7.2 (H) 04/23/2017   There were no vitals filed for this visit.  Lipid Panel     Component Value Date/Time   CHOL 197 12/31/2016 0740   TRIG 420 (H) 12/31/2016 0740   HDL 31 (L) 12/31/2016 0740   LDLCALC Comment 12/31/2016 0740    Home fasting CBG: ***  2 hour post-prandial/random CBG: ***.  Clinical ASCVD: {YES/NO:21197}; Major ASCVD events: ACS in last 12 months, H/o MI, H/o ischemic stroke, symptomatic PAD; High-risk conditions: Age  32 or older, heterozygous FH, h/o coronary bypass or PCI, DM, HTN, CKD, current smoking, LDL > 100 despite max tolerated statin/ezetimibe, h/o CHF  ASCVD risk factors: age 32-75, family h/o premature ASCVD, persistently elevated LDL >/=160, CKD, metabolic syndrome, premature menopause/pre-eclampsia, inflammatory disease (psoriasis, RA, HIV), [place] ancestry, lipid/biomarkers, persistently elevated TG >/= 175, ABI < 0.9 10 year ASCVD risk: ***calcluate only if multiple ASCVD risk factors + aged 32-75  FYI - Goal LDL < 70 mg/dL in patients with ASCVD and in very-high risk ASCVD patients***  A/P: #Diabetes longstanding/newly diagnosed currently ***. Patient {Actions; denies-reports:120008} hypoglycemic events and is able to verbalize appropriate hypoglycemia management plan. Patient {Actions; denies-reports:120008} adherence with medication. Control is suboptimal due to ***. - *** - Counseled on sick day rules for *** - Next A1C anticipated ***.    #ASCVD risk - primary***secondary prevention in patient aged 32***50-75 with***out DM, baseline LDL 70-189, multiple ASCVD risk factors***very high risk ASCVD***stable ASCVD - moderate***high intensity statin indicated.  - {Meds adjust:18428} Aspirin *** mg  - {Meds adjust:18428} ***statin *** mg.  - Lipid panel today  #Hypertension longstanding/newly diagnosed currently ***.  Patient {Actions; denies-reports:120008} adherence with medication. Control is suboptimal due to ***. - BMET today  Written patient instructions provided.  Total time in face to face counseling *** minutes.    Follow up in Pharmacist Clinic Visit ***.   Patient seen with ***  Allena Katzaroline E Kymora Sciara, Pharm.D., BCPS, CPP PGY2 Ambulatory Care Pharmacy Resident Phone: [phone]

## 2017-06-02 ENCOUNTER — Telehealth: Payer: Self-pay | Admitting: Pharmacist

## 2017-06-02 NOTE — Telephone Encounter (Signed)
Called to f/u on BMET that pt rec'd from outside facility (written order provided by Dr. Birdie SonsSonnenberg) after increasing lisinopril at last visit. No answer. Left HIPAA-compliant VM requesting the patient call me back.  Will follow up this week.   Allena Katzaroline E Welles, Pharm.D., BCPS PGY2 Ambulatory Care Pharmacy Resident Phone: [phone]

## 2017-06-17 ENCOUNTER — Ambulatory Visit: Payer: Self-pay | Admitting: Medical

## 2017-06-18 ENCOUNTER — Encounter: Payer: Self-pay | Admitting: Adult Health

## 2017-06-18 ENCOUNTER — Ambulatory Visit: Payer: Self-pay | Admitting: Adult Health

## 2017-06-18 VITALS — BP 126/78 | HR 108 | Temp 98.4°F | Resp 16 | Ht 61.0 in | Wt 209.0 lb

## 2017-06-18 DIAGNOSIS — Z720 Tobacco use: Secondary | ICD-10-CM

## 2017-06-18 DIAGNOSIS — J069 Acute upper respiratory infection, unspecified: Secondary | ICD-10-CM

## 2017-06-18 DIAGNOSIS — J4520 Mild intermittent asthma, uncomplicated: Secondary | ICD-10-CM

## 2017-06-18 DIAGNOSIS — F17200 Nicotine dependence, unspecified, uncomplicated: Secondary | ICD-10-CM

## 2017-06-18 DIAGNOSIS — R509 Fever, unspecified: Secondary | ICD-10-CM

## 2017-06-18 MED ORDER — PREDNISONE 10 MG (21) PO TBPK: ORAL_TABLET | ORAL | 0 refills | Status: DC

## 2017-06-18 MED ORDER — AZITHROMYCIN 250 MG PO TABS: ORAL_TABLET | ORAL | 0 refills | Status: DC

## 2017-06-18 MED ORDER — ALBUTEROL SULFATE HFA 108 (90 BASE) MCG/ACT IN AERS
2.0000 | INHALATION_SPRAY | Freq: Four times a day (QID) | RESPIRATORY_TRACT | 0 refills | Status: DC | PRN
Start: 2017-06-18 — End: 2017-09-18

## 2017-06-18 NOTE — Progress Notes (Signed)
Subjective:     Patient ID: Allison [name], female   DOB: 1985-06-23, 32 y.o.   MRN: [id number]  HPI Patient is a 32 year old female in no acute distress who presents to the clinic with a cough, sneezing. She had fever two nights ago 101.9 that has resolved. She reports she first started feeling bad on Sunday 06/14/17.  She has had chills and body aches today. Fatigue mild. Denies any temperature today.  She has been using throat lozenges. Sore throat is resolving.  Mild shortness of breath with climbing two levels of stairs at home. Mild wheezing she reports intermittently.   Both kids have flu at home at age 2 and 64.   Patient  denies  rash, chest pain, shortness of breath, nausea, vomiting, or diarrhea.  She has taken Day Quil Sever cough and cold today not long before appointment. Recheck Heart rate by provider 103 beats per minute and oxygen saturation 99 % .   Blood pressure 126/78, pulse (!) 108, temperature 98.4 F (36.9 C), resp. rate 16, height 5\' 1"  (1.549 m), weight 209 lb (94.8 kg), last menstrual period 05/19/2017, SpO2 98 %.  Social History   Socioeconomic History  . Marital status: Married    Spouse name: Allison [name]  . Number of children: 2  . Years of education: 28  . Highest education level: Not on file  Social Needs  . Financial resource strain: Not on file  . Food insecurity - worry: Not on file  . Food insecurity - inability: Not on file  . Transportation needs - medical: Not on file  . Transportation needs - non-medical: Not on file  Occupational History  . Occupation: Physical Department/Environmental Services    Employer: [employer]  Tobacco Use  . Smoking status: Current Every Day Smoker    Packs/day: 1.00    Years: 10.00    Pack years: 10.00    Types: Cigarettes    Start date: 05/14/2006  . Smokeless tobacco: Never Used  Substance and Sexual Activity  . Alcohol use: Yes    Alcohol/week: 0.0 oz    Comment: Rare   . Drug use: No  . Sexual  activity: Yes  Other Topics Concern  . Not on file  Social History Narrative   Allison [name] grew up in [place], [place]. She is married and has 2 children. She enjoys spending time with her family. She enjoys reading and shopping.       Smokes < 10 cigarettes daily - trying to quit   Caffeine - drinks 3 cans of diet pepsi daily   Alcohol - rare     Review of Systems  Constitutional: Positive for appetite change (missed work since Monday. ), chills and fatigue. Negative for activity change, diaphoresis, fever and unexpected weight change.  HENT: Positive for congestion, postnasal drip, rhinorrhea and sore throat. Negative for dental problem, drooling, ear discharge, ear pain, facial swelling, hearing loss, mouth sores, nosebleeds, sinus pressure, sinus pain, sneezing, tinnitus, trouble swallowing and voice change.   Eyes: Negative.   Respiratory: Positive for cough, chest tightness and wheezing (mild at home she reports. ). Negative for apnea, choking, shortness of breath and stridor.   Cardiovascular: Negative.   Gastrointestinal: Negative.   Endocrine: Negative.   Genitourinary: Negative.   Musculoskeletal: Negative.   Skin: Negative.   Allergic/Immunologic:       Asthma/ smoker    Neurological: Negative.   Hematological: Negative.   Psychiatric/Behavioral: Negative.  Objective:   Physical Exam  Constitutional: She is oriented to person, place, and time. Vital signs are normal. She appears well-developed and well-nourished. She is active.  Non-toxic appearance. She does not have a sickly appearance. She does not appear ill. No distress.  HENT:  Head: Normocephalic and atraumatic.  Right Ear: External ear normal.  Left Ear: External ear normal.  Nose: Nose normal.  Mouth/Throat: Oropharynx is clear and moist. No oropharyngeal exudate.  Eyes: Conjunctivae, EOM and lids are normal. Pupils are equal, round, and reactive to light. Right eye exhibits no discharge. Left eye exhibits no  discharge. No scleral icterus.  Neck: Trachea normal, normal range of motion, full passive range of motion without pain and phonation normal. Neck supple. Normal carotid pulses, no hepatojugular reflux and no JVD present. Carotid bruit is not present. No tracheal deviation present. No Brudzinski's sign noted.  Cardiovascular: Normal rate, regular rhythm, normal heart sounds and intact distal pulses. Exam reveals no gallop and no friction rub.  No murmur heard. Pulmonary/Chest: Effort normal. No stridor. No respiratory distress. She has wheezes (very mild expiratory wheeze with ausculation bilateral upper lobes only. ) in the right upper field and the left upper field. She has no rales. She exhibits no tenderness.  Abdominal: Soft. Bowel sounds are normal.  Musculoskeletal: Normal range of motion.  Lymphadenopathy:       Head (right side): No submental, no submandibular, no tonsillar, no preauricular, no posterior auricular and no occipital adenopathy present.       Head (left side): No submental, no submandibular, no tonsillar, no preauricular, no posterior auricular and no occipital adenopathy present.    She has no cervical adenopathy.       Right cervical: No superficial cervical, no deep cervical and no posterior cervical adenopathy present.      Left cervical: No superficial cervical, no deep cervical and no posterior cervical adenopathy present.  Neurological: She is alert and oriented to person, place, and time. She has normal strength. She displays normal reflexes. No cranial nerve deficit or sensory deficit. She exhibits normal muscle tone. Coordination normal.  Skin: Skin is warm, dry and intact. No rash noted. She is not diaphoretic. No cyanosis or erythema. No pallor. Nails show no clubbing.  Psychiatric: She has a normal mood and affect. Her behavior is normal. Judgment and thought content normal.  Nursing note and vitals reviewed.      Assessment:     Fever, unspecified fever  cause - Plan: CBC w/Diff  Upper respiratory tract infection, unspecified type  Mild intermittent reactive airway disease with wheezing without complication  Smoker  Declined smoking cessation      Plan:     Orders Placed This Encounter  Procedures  . CBC w/Diff  Patient declined blood draw when Armando Gangracy [name] went in the room to draw- despite providers recommendation. Meds ordered this encounter  Medications  . azithromycin (ZITHROMAX) 250 MG tablet    Sig: Take 2 tablets ( 500 mg ) day 1 and then take 1 tablet (250 mg) day 2, 3, 4, and 5    Dispense:  6 tablet    Refill:  0  . predniSONE (STERAPRED UNI-PAK 21 TAB) 10 MG (21) TBPK tablet    Sig: By mouth Take 6 tablets on day 1, Take 5 tablets day 2 Take 4 tablets day 3 Take 3 tablets day 4 Take 2 tablets day five 5 Take 1 tablet day    Dispense:  21 tablet  Refill:  0  . albuterol (PROVENTIL HFA;VENTOLIN HFA) 108 (90 Base) MCG/ACT inhaler    Sig: Inhale 2 puffs into the lungs every 6 (six) hours as needed for wheezing or shortness of breath.    Dispense:  1 Inhaler    Refill:  0    Advised Flu test was negative. Not within timeframe for Tamiflu, she declines Tamiflu as well.   Patient requests work for note for this week, she is advised not to return to work until fever and chills have resolved x 48 hours. Note given for 06/15/2017 to 06/21/17 she will return to work on 06/22/2017 and sooner per above if improved. If further note is needed for work she will need return appointment or see your Glori Luis, MD  Advised to avoid all decongestants with hypertension. May use plain Mucinex over the counter  per package instructions.  Discussed worsening symptoms and when to seek medical attention after hours and during office hours.   Advised to return to clinic for an appointment if no improvement within 72 hours or if any symptoms change or worsen. Advised ER or urgent Care if after hours or on weekend. 911 for emergency  symptoms at any time.   Patient verbalized understanding of all instructions given and denies any further questions at this time.

## 2017-06-18 NOTE — Patient Instructions (Signed)
Smoking Tobacco Information Smoking tobacco will very likely harm your health. Tobacco contains a poisonous (toxic), colorless chemical called nicotine. Nicotine affects the brain and makes tobacco addictive. This change in your brain can make it hard to stop smoking. Tobacco also has other toxic chemicals that can hurt your body and raise your risk of many cancers. How can smoking tobacco affect me? Smoking tobacco can increase your chances of having serious health conditions, such as:  Cancer. Smoking is most commonly associated with lung cancer, but can lead to cancer in other parts of the body.  Chronic obstructive pulmonary disease (COPD). This is a long-term lung condition that makes it hard to breathe. It also gets worse over time.  High blood pressure (hypertension), heart disease, stroke, or heart attack.  Lung infections, such as pneumonia.  Cataracts. This is when the lenses in the eyes become clouded.  Digestive problems. This may include peptic ulcers, heartburn, and gastroesophageal reflux disease (GERD).  Oral health problems, such as gum disease and tooth loss.  Loss of taste and smell.  Smoking can affect your appearance by causing:  Wrinkles.  Yellow or stained teeth, fingers, and fingernails.  Smoking tobacco can also affect your social life.  Many workplaces, restaurants, hotels, and public places are tobacco-free. This means that you may experience challenges in finding places to smoke when away from home.  The cost of a smoking habit can be expensive. Expenses for someone who smokes come in two ways: ? You spend money on a regular basis to buy tobacco. ? Your health care costs in the long-term are higher if you smoke.  Tobacco smoke can also affect the health of those around you. Children of smokers have greater chances of: ? Sudden infant death syndrome (SIDS). ? Ear infections. ? Lung infections.  What lifestyle changes can be made?  Do not start  smoking. Quit if you already do.  To quit smoking: ? Make a plan to quit smoking and commit yourself to it. Look for programs to help you and ask your health care provider for recommendations and ideas. ? Talk with your health care provider about using nicotine replacement medicines to help you quit. Medicine replacement medicines include gum, lozenges, patches, sprays, or pills. ? Do not replace cigarette smoking with electronic cigarettes, which are commonly called e-cigarettes. The safety of e-cigarettes is not known, and some may contain harmful chemicals. ? Avoid places, people, or situations that tempt you to smoke. ? If you try to quit but return to smoking, don't give up hope. It is very common for people to try a number of times before they fully succeed. When you feel ready again, give it another try.  Quitting smoking might affect the way you eat as well as your weight. Be prepared to monitor your eating habits. Get support in planning and following a healthy diet.  Ask your health care provider about having regular tests (screenings) to check for cancer. This may include blood tests, imaging tests, and other tests.  Exercise regularly. Consider taking walks, joining a gym, or doing yoga or exercise classes.  Develop skills to manage your stress. These skills include meditation. What are the benefits of quitting smoking? By quitting smoking, you may:  Lower your risk of getting cancer and other diseases caused by smoking.  Live longer.  Breathe better.  Lower your blood pressure and heart rate.  Stop your addiction to tobacco.  Stop creating secondhand smoke that hurts other people.  Improve your   sense of taste and smell.  Look better over time, due to having fewer wrinkles and less staining.  What can happen if changes are not made? If you do not stop smoking, you may:  Get cancer and other diseases.  Develop COPD or other long-term (chronic) lung  conditions.  Develop serious problems with your heart and blood vessels (cardiovascular system).  Need more tests to screen for problems caused by smoking.  Have higher, long-term healthcare costs from medicines or treatments related to smoking.  Continue to have worsening changes in your lungs, mouth, and nose.  Where to find support: To get support to quit smoking, consider:  Asking your health care provider for more information and resources.  Taking classes to learn more about quitting smoking.  Looking for local organizations that offer resources about quitting smoking.  Joining a support group for people who want to quit smoking in your local community.  Where to find more information: You may find more information about quitting smoking from:  HelpGuide.org: www.helpguide.org/articles/addictions/how-to-quit-smoking.htm  BankRights.uy: smokefree.gov  American Lung Association: www.lung.org  Contact a health care provider if:  You have problems breathing.  Your lips, nose, or fingers turn blue.  You have chest pain.  You are coughing up blood.  You feel faint or you pass out.  You have other noticeable changes that cause you to worry. Summary  Smoking tobacco can negatively affect your health, the health of those around you, your finances, and your social life.  Do not start smoking. Quit if you already do. If you need help quitting, ask your health care provider.  Think about joining a support group for people who want to quit smoking in your local community. There are many effective programs that will help you to quit this behavior. This information is not intended to replace advice given to you by your health care provider. Make sure you discuss any questions you have with your health care provider. Document Released: 05/13/2016 Document Revised: 05/13/2016 Document Reviewed: 05/13/2016 Elsevier Interactive Patient Education  2018 [employer]. Bronchospasm, Adult Bronchospasm is a tightening of the airways going into the lungs. During an episode, it may be harder to breathe. You may cough, and you may make a whistling sound when you breathe (wheeze). This condition often affects people with asthma. What are the causes? This condition is caused by swelling and irritation in the airways. It can be triggered by:  An infection (common).  Seasonal allergies.  An allergic reaction.  Exercise.  Irritants. These include pollution, cigarette smoke, strong odors, aerosol sprays, and paint fumes.  Weather changes. Winds increase molds and pollens in the air. Cold air may cause swelling.  Stress and emotional upset.  What are the signs or symptoms? Symptoms of this condition include:  Wheezing. If the episode was triggered by an allergy, wheezing may start right away or hours later.  Nighttime coughing.  Frequent or severe coughing with a simple cold.  Chest tightness.  Shortness of breath.  Decreased ability to exercise.  How is this diagnosed? This condition is usually diagnosed with a review of your medical history and a physical exam. Tests, such as lung function tests, are sometimes done to look for other conditions. The need for a chest X-ray depends on where the wheezing occurs and whether it is the first time you have wheezed. How is this treated? This condition may be treated with:  Inhaled medicines. These open up the airways and help you breathe. They can be  taken with an inhaler or a nebulizer device.  Corticosteroid medicines. These may be given for severe bronchospasm, usually when it is associated with asthma.  Avoiding triggers, such as irritants, infection, or allergies.  Follow these instructions at home: Medicines  Take over-the-counter and prescription medicines only as told by your health care provider.  If you need to use an inhaler or nebulizer to take your medicine, ask your health care  provider to explain how to use it correctly. If you were given a spacer, always use it with your inhaler. Lifestyle  Reduce the number of triggers in your home. To do this: ? Change your heating and air conditioning filter at least once a month. ? Limit your use of fireplaces and wood stoves. ? Do not smoke. Do not allow smoking in your home. ? Avoid using perfumes and fragrances. ? Get rid of pests, such as roaches and mice, and their droppings. ? Remove any mold from your home. ? Keep your house clean and dust free. Use unscented cleaning products. ? Replace carpet with wood, tile, or vinyl flooring. Carpet can trap dander and dust. ? Use allergy-proof pillows, mattress covers, and box spring covers. ? Wash bed sheets and blankets every week in hot water. Dry them in a dryer. ? Use blankets that are made of polyester or cotton. ? Wash your hands often. ? Do not allow pets in your bedroom.  Avoid breathing in cold air when you exercise. General instructions  Have a plan for seeking medical care. Know when to call your health care provider and local emergency services, and where to get emergency care.  Stay up to date on your immunizations.  When you have an episode of bronchospasm, stay calm. Try to relax and breathe more slowly.  If you have asthma, make sure you have an asthma action plan.  Keep all follow-up visits as told by your health care provider. This is important. Contact a health care provider if:  You have muscle aches.  You have chest pain.  The mucus that you cough up (sputum) changes from clear or white to yellow, green, gray, or bloody.  You have a fever.  Your sputum gets thicker. Get help right away if:  Your wheezing and coughing get worse, even after you take your prescribed medicines.  It gets even harder to breathe.  You develop severe chest pain. Summary  Bronchospasm is a tightening of the airways going into the lungs.  During an episode of  bronchospasm, you may have a harder time breathing. You may cough and make a whistling sound when you breathe (wheeze).  Avoid exposure to triggers such as smoke, dust, mold, animal dander, and fragrances.  When you have an episode of bronchospasm, stay calm. Try to relax and breathe more slowly. This information is not intended to replace advice given to you by your health care provider. Make sure you discuss any questions you have with your health care provider. Document Released: 05/01/2003 Document Revised: 04/24/2016 Document Reviewed: 04/24/2016 Elsevier Interactive Patient Education  2017 Elsevier Inc. Upper Respiratory Infection, Adult Most upper respiratory infections (URIs) are caused by a virus. A URI affects the nose, throat, and upper air passages. The most common type of URI is often called "the common cold." Follow these instructions at home:  Take medicines only as told by your doctor.  Gargle warm saltwater or take cough drops to comfort your throat as told by your doctor.  Use a warm mist humidifier or inhale  steam from a shower to increase air moisture. This may make it easier to breathe.  Drink enough fluid to keep your pee (urine) clear or pale yellow.  Eat soups and other clear broths.  Have a healthy diet.  Rest as needed.  Go back to work when your fever is gone or your doctor says it is okay. ? You may need to stay home longer to avoid giving your URI to others. ? You can also wear a face mask and wash your hands often to prevent spread of the virus.  Use your inhaler more if you have asthma.  Do not use any tobacco products, including cigarettes, chewing tobacco, or electronic cigarettes. If you need help quitting, ask your doctor. Contact a doctor if:  You are getting worse, not better.  Your symptoms are not helped by medicine.  You have chills.  You are getting more short of breath.  You have brown or red mucus.  You have yellow or brown  discharge from your nose.  You have pain in your face, especially when you bend forward.  You have a fever.  You have puffy (swollen) neck glands.  You have pain while swallowing.  You have white areas in the back of your throat. Get help right away if:  You have very bad or constant: ? Headache. ? Ear pain. ? Pain in your forehead, behind your eyes, and over your cheekbones (sinus pain). ? Chest pain.  You have long-lasting (chronic) lung disease and any of the following: ? Wheezing. ? Long-lasting cough. ? Coughing up blood. ? A change in your usual mucus.  You have a stiff neck.  You have changes in your: ? Vision. ? Hearing. ? Thinking. ? Mood. This information is not intended to replace advice given to you by your health care provider. Make sure you discuss any questions you have with your health care provider. Document Released: 10/15/2007 Document Revised: 12/30/2015 Document Reviewed: 08/03/2013 Elsevier Interactive Patient Education  2018 [employer]. Influenza, Adult Influenza ("the flu") is an infection in the lungs, nose, and throat (respiratory tract). It is caused by a virus. The flu causes many common cold symptoms, as well as a high fever and body aches. It can make you feel very sick. The flu spreads easily from person to person (is contagious). Getting a flu shot (influenza vaccination) every year is the best way to prevent the flu. Follow these instructions at home:  Take over-the-counter and prescription medicines only as told by your doctor.  Use a cool mist humidifier to add moisture (humidity) to the air in your home. This can make it easier to breathe.  Rest as needed.  Drink enough fluid to keep your pee (urine) clear or pale yellow.  Cover your mouth and nose when you cough or sneeze.  Wash your hands with soap and water often, especially after you cough or sneeze. If you cannot use soap and water, use hand sanitizer.  Stay home from  work or school as told by your doctor. Unless you are visiting your doctor, try to avoid leaving home until your fever has been gone for 24 hours without the use of medicine.  Keep all follow-up visits as told by your doctor. This is important. How is this prevented?  Getting a yearly (annual) flu shot is the best way to avoid getting the flu. You may get the flu shot in late summer, fall, or winter. Ask your doctor when you should get your flu  shot.  Wash your hands often or use hand sanitizer often.  Avoid contact with people who are sick during cold and flu season.  Eat healthy foods.  Drink plenty of fluids.  Get enough sleep.  Exercise regularly. Contact a doctor if:  You get new symptoms.  You have: ? Chest pain. ? Watery poop (diarrhea). ? A fever.  Your cough gets worse.  You start to have more mucus.  You feel sick to your stomach (nauseous).  You throw up (vomit). Get help right away if:  You start to be short of breath or have trouble breathing.  Your skin or nails turn a bluish color.  You have very bad pain or stiffness in your neck.  You get a sudden headache.  You get sudden pain in your face or ear.  You cannot stop throwing up. This information is not intended to replace advice given to you by your health care provider. Make sure you discuss any questions you have with your health care provider. Document Released: 02/05/2008 Document Revised: 10/04/2015 Document Reviewed: 02/20/2015 Elsevier Interactive Patient Education  2017 [employer].

## 2017-06-30 NOTE — Telephone Encounter (Signed)
Called to follow up on BMET. Pt not available, left message for her to call me back.

## 2017-07-02 NOTE — Progress Notes (Signed)
She was following up with Birdie SonsSonnenberg, Yehuda MaoEric G, MD Per Armando Gangracy [name] RN call to patient.

## 2017-08-03 ENCOUNTER — Other Ambulatory Visit: Payer: Self-pay | Admitting: Family Medicine

## 2017-08-04 NOTE — Telephone Encounter (Signed)
Last OV 05/20/17 last filled by Dr.cook 11/26/16 4 3rf

## 2017-08-12 ENCOUNTER — Other Ambulatory Visit: Payer: Self-pay | Admitting: Family Medicine

## 2017-08-12 DIAGNOSIS — E119 Type 2 diabetes mellitus without complications: Secondary | ICD-10-CM

## 2017-08-12 DIAGNOSIS — Z794 Long term (current) use of insulin: Principal | ICD-10-CM

## 2017-08-12 NOTE — Telephone Encounter (Signed)
Please advise 

## 2017-08-12 NOTE — Telephone Encounter (Signed)
Copied from CRM [id number]. Topic: Quick Communication - Rx Refill/Question >> Aug 12, 2017  4:20 PM Everardo PacificMoton, Ammara Raj, [place] wrote: Medication: Lantus  Has the patient contacted their pharmacy? Yes Patient calling because she needs a refill on the above medication. She does have an OV 09-18-2017 @10  Am. Stated that she has been in contact with her pharmacy as well  Preferred Pharmacy (with phone number or street name): [facility] [address] [place] [phone] Agent: Please be advised that RX refills may take up to 3 business days. We ask that you follow-up with your pharmacy.

## 2017-08-12 NOTE — Telephone Encounter (Signed)
Patient needs a new prescription sent with the correct dosage of Lantus Pen  Last OV: 04/14/17; upcoming 09/18/17 PCP: Birdie SonsSonnenberg [facility] [address] - [place], [place] - [address] [phone] (Phone) [phone] (Fax)

## 2017-08-13 MED ORDER — INSULIN GLARGINE 100 UNIT/ML SOLOSTAR PEN: 20.0000 [IU] | PEN_INJECTOR | Freq: Every day | SUBCUTANEOUS | 2 refills | Status: DC

## 2017-08-13 NOTE — Telephone Encounter (Signed)
Patient is using 20 units daily per last note form Allison [name]

## 2017-09-18 ENCOUNTER — Ambulatory Visit: Payer: BLUE CROSS/BLUE SHIELD | Admitting: Family Medicine

## 2017-09-18 ENCOUNTER — Encounter: Payer: Self-pay | Admitting: Family Medicine

## 2017-09-18 ENCOUNTER — Other Ambulatory Visit: Payer: Self-pay

## 2017-09-18 VITALS — BP 118/80 | HR 100 | Temp 97.7°F | Ht 62.0 in | Wt 207.8 lb

## 2017-09-18 DIAGNOSIS — G5603 Carpal tunnel syndrome, bilateral upper limbs: Secondary | ICD-10-CM | POA: Diagnosis not present

## 2017-09-18 DIAGNOSIS — I10 Essential (primary) hypertension: Secondary | ICD-10-CM

## 2017-09-18 DIAGNOSIS — E119 Type 2 diabetes mellitus without complications: Secondary | ICD-10-CM | POA: Diagnosis not present

## 2017-09-18 DIAGNOSIS — F439 Reaction to severe stress, unspecified: Secondary | ICD-10-CM | POA: Diagnosis not present

## 2017-09-18 DIAGNOSIS — Z794 Long term (current) use of insulin: Secondary | ICD-10-CM

## 2017-09-18 DIAGNOSIS — L03012 Cellulitis of left finger: Secondary | ICD-10-CM

## 2017-09-18 NOTE — Assessment & Plan Note (Signed)
Patient with a lot of stressors in her life right now.  She is not depressed.  She we will monitor for any depression and contact us if this occurs to consider treatment.  Advised if she develops suicidal ideation she should go to the emergency department.

## 2017-09-18 NOTE — Assessment & Plan Note (Addendum)
Well-controlled.  Continue current regimen.  Written prescription for CMP given.

## 2017-09-18 NOTE — Progress Notes (Signed)
Marikay Alar, MD Phone: [phone]  Allison [name] is a 32 y.o. female who presents today for f/u.  HYPERTENSION  Disease Monitoring  Home BP Monitoring not checking Chest pain- no    Dyspnea- no Medications  Compliance-  Taking lisinopril, HCTZ.  Edema- no  DIABETES Disease Monitoring: Blood Sugar ranges-140s Polyuria/phagia/dipsia- dipsia      Optho- due for visit, she will schedule Medications: Compliance- taking lantus 22 u, trulicity 1.5 mg though she was previously mistakenly taking 0.75 mg Hypoglycemic symptoms- no  She continues to have issues with her carpal tunnel symptoms.  Notes it seems to be getting a little worse.  Her hands will go numb while she is working.  She gets tingling in her right hand greater than left.  Also has discomfort into her hands.  She reports she is very stressed.  Her mother-in-law has been a victim of domestic violence and her mother-in-law will be moving in with them.  Her daughter has been depressed and took 15 ibuprofen previously.  Her daughter has been evaluated by her physician and is undergoing therapy.  Patient notes she is not depressed yet though has been depressed in the past and is concerned that she may become depressed with everything else that is been going on.     Social History   Tobacco Use  Smoking Status Current Every Day Smoker  . Packs/day: 1.00  . Years: 10.00  . Pack years: 10.00  . Types: Cigarettes  . Start date: 05/14/2006  Smokeless Tobacco Never Used     ROS see history of present illness  Objective  Physical Exam Vitals:   09/18/17 1004  BP: 118/80  Pulse: 100  Temp: 97.7 F (36.5 C)  SpO2: 97%    BP Readings from Last 3 Encounters:  09/18/17 118/80  06/18/17 126/78  05/20/17 130/90   Wt Readings from Last 3 Encounters:  09/18/17 207 lb 12.8 oz (94.3 kg)  06/18/17 209 lb (94.8 kg)  05/20/17 211 lb 6.4 oz (95.9 kg)    Physical Exam  Constitutional: No distress.  Cardiovascular:  Normal rate, regular rhythm and normal heart sounds.  Pulmonary/Chest: Effort normal and breath sounds normal.  Musculoskeletal: She exhibits no edema.  Negative Tinel's at bilateral wrists, positive Phalen's bilaterally, 5/5 grip strength bilaterally, sensation light touch intact bilateral hands  Neurological: She is alert.  Skin: Skin is warm and dry. She is not diaphoretic.   Diabetic Foot Exam - Simple   Simple Foot Form Diabetic Foot exam was performed with the following findings:  Yes 09/18/2017 10:25 AM  Visual Inspection No deformities, no ulcerations, no other skin breakdown bilaterally:  Yes Sensation Testing Intact to touch and monofilament testing bilaterally:  Yes Pulse Check Posterior Tibialis and Dorsalis pulse intact bilaterally:  Yes Comments      Assessment/Plan: Please see individual problem list.  Benign essential HTN Well-controlled.  Continue current regimen.  Written prescription for CMP given.  Type 2 diabetes mellitus without complication, with long-term current use of insulin (HCC) Continue current medication.  Written prescription for A1c given.  Foot exam completed.  Paronychia of finger of left hand Resolved.  No recurrence.  Carpal tunnel syndrome Refer to orthopedic surgery to consider further evaluation and management.  Stress Patient with a lot of stressors in her life right now.  She is not depressed.  She we will monitor for any depression and contact us if this occurs to consider treatment.  Advised if she develops suicidal ideation she should  go to the emergency department.   Orders Placed This Encounter  Procedures  . Ambulatory referral to Orthopedic Surgery    Referral Priority:   Routine    Referral Type:   Surgical    Referral Reason:   Specialty Services Required    Requested Specialty:   Orthopedic Surgery    Number of Visits Requested:   1    No orders of the defined types were placed in this encounter.    Marikay Alar, MD [facility]

## 2017-09-18 NOTE — Patient Instructions (Signed)
Nice to see you. We will check lab work today and contact you with the results. If you start to feel depressed please let us know. We will refer you to orthopedics as well. If you develop thoughts of harming yourself please go to the emergency department.

## 2017-09-18 NOTE — Assessment & Plan Note (Signed)
Refer to orthopedic surgery to consider further evaluation and management.

## 2017-09-18 NOTE — Assessment & Plan Note (Signed)
Resolved.  No recurrence 

## 2017-09-18 NOTE — Assessment & Plan Note (Signed)
Continue current medication.  Written prescription for A1c given.  Foot exam completed.

## 2017-09-24 ENCOUNTER — Ambulatory Visit (INDEPENDENT_AMBULATORY_CARE_PROVIDER_SITE_OTHER): Payer: Self-pay | Admitting: Orthopaedic Surgery

## 2017-10-08 ENCOUNTER — Other Ambulatory Visit: Payer: Medicaid Other

## 2017-10-08 DIAGNOSIS — Z794 Long term (current) use of insulin: Principal | ICD-10-CM

## 2017-10-08 DIAGNOSIS — E119 Type 2 diabetes mellitus without complications: Secondary | ICD-10-CM

## 2017-10-08 DIAGNOSIS — I10 Essential (primary) hypertension: Secondary | ICD-10-CM

## 2017-10-08 NOTE — Progress Notes (Signed)
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 Patient informed me that she had dinner around 3am and bag of pretzel an hour later.  Labs was still drawing at 8:30am.

## 2017-10-09 LAB — COMPREHENSIVE METABOLIC PANEL
ALK PHOS: 59 IU/L (ref 39–117)
ALT: 12 IU/L (ref 0–32)
AST: 12 IU/L (ref 0–40)
Albumin/Globulin Ratio: 1.6 (ref 1.2–2.2)
Albumin: 4.4 g/dL (ref 3.5–5.5)
BUN/Creatinine Ratio: 17 (ref 9–23)
BUN: 11 mg/dL (ref 6–20)
Bilirubin Total: <0.2 mg/dL (ref 0.0–1.2)
CHLORIDE: 103 mmol/L (ref 96–106)
CO2: 20 mmol/L (ref 20–29)
CREATININE: 0.63 mg/dL (ref 0.57–1.00)
Calcium: 9.2 mg/dL (ref 8.7–10.2)
GFR calc Af Amer: 137 mL/min/{1.73_m2} (ref >59)
GFR calc non Af Amer: 119 mL/min/{1.73_m2} (ref >59)
GLUCOSE: 173 mg/dL — AB (ref 65–99)
Globulin, Total: 2.7 g/dL (ref 1.5–4.5)
Potassium: 3.8 mmol/L (ref 3.5–5.2)
Sodium: 140 mmol/L (ref 134–144)
Total Protein: 7.1 g/dL (ref 6.0–8.5)

## 2017-10-09 LAB — HGB A1C W/O EAG: HEMOGLOBIN A1C: 7.7 % — AB (ref 4.8–5.6)

## 2018-04-05 ENCOUNTER — Encounter: Payer: Self-pay | Admitting: Medical

## 2018-04-05 ENCOUNTER — Ambulatory Visit: Payer: Medicaid Other | Admitting: Medical

## 2018-04-05 VITALS — BP 160/98 | HR 100 | Temp 98.5°F | Resp 18 | Wt 213.0 lb

## 2018-04-05 DIAGNOSIS — J40 Bronchitis, not specified as acute or chronic: Secondary | ICD-10-CM

## 2018-04-05 DIAGNOSIS — H6503 Acute serous otitis media, bilateral: Secondary | ICD-10-CM

## 2018-04-05 DIAGNOSIS — R059 Cough, unspecified: Secondary | ICD-10-CM

## 2018-04-05 DIAGNOSIS — J029 Acute pharyngitis, unspecified: Secondary | ICD-10-CM

## 2018-04-05 DIAGNOSIS — R05 Cough: Secondary | ICD-10-CM

## 2018-04-05 MED ORDER — AMOXICILLIN-POT CLAVULANATE 875-125 MG PO TABS: 1.0000 | ORAL_TABLET | Freq: Two times a day (BID) | ORAL | 0 refills | Status: DC

## 2018-04-05 MED ORDER — BENZONATATE 100 MG PO CAPS: 100.0000 mg | ORAL_CAPSULE | Freq: Three times a day (TID) | ORAL | 0 refills | Status: DC | PRN

## 2018-04-05 NOTE — Progress Notes (Signed)
   Subjective:    Patient ID: Allison [name], female    DOB: 1986-02-22, 32 y.o.   MRN: [id number]  HPI  See note above.       Objective:   Physical Exam        Assessment & Plan:

## 2018-04-05 NOTE — Patient Instructions (Signed)
Pharyngitis Pharyngitis is a sore throat (pharynx). There is redness, pain, and swelling of your throat. Follow these instructions at home:  Drink enough fluids to keep your pee (urine) clear or pale yellow.  Only take medicine as told by your doctor. ? You may get sick again if you do not take medicine as told. Finish your medicines, even if you start to feel better. ? Do not take aspirin.  Rest.  Rinse your mouth (gargle) with salt water ( tsp of salt per 1 qt of water) every 1-2 hours. This will help the pain.  If you are not at risk for choking, you can suck on hard candy or sore throat lozenges. Contact a doctor if:  You have large, tender lumps on your neck.  You have a rash.  You cough up green, yellow-brown, or bloody spit. Get help right away if:  You have a stiff neck.  You drool or cannot swallow liquids.  You throw up (vomit) or are not able to keep medicine or liquids down.  You have very bad pain that does not go away with medicine.  You have problems breathing (not from a stuffy nose). This information is not intended to replace advice given to you by your health care provider. Make sure you discuss any questions you have with your health care provider. Document Released: 10/15/2007 Document Revised: 10/04/2015 Document Reviewed: 01/03/2013 Elsevier Interactive Patient Education  2017 [employer] Acute Bronchitis, Adult Acute bronchitis is when air tubes (bronchi) in the lungs suddenly get swollen. The condition can make it hard to breathe. It can also cause these symptoms:  A cough.  Coughing up clear, yellow, or green mucus.  Wheezing.  Chest congestion.  Shortness of breath.  A fever.  Body aches.  Chills.  A sore throat.  Follow these instructions at home: Medicines  Take over-the-counter and prescription medicines only as told by your doctor.  If you were prescribed an antibiotic medicine, take it as told by your doctor. Do not  stop taking the antibiotic even if you start to feel better. General instructions  Rest.  Drink enough fluids to keep your pee (urine) clear or pale yellow.  Avoid smoking and secondhand smoke. If you smoke and you need help quitting, ask your doctor. Quitting will help your lungs heal faster.  Use an inhaler, cool mist vaporizer, or humidifier as told by your doctor.  Keep all follow-up visits as told by your doctor. This is important. How is this prevented? To lower your risk of getting this condition again:  Wash your hands often with soap and water. If you cannot use soap and water, use hand sanitizer.  Avoid contact with people who have cold symptoms.  Try not to touch your hands to your mouth, nose, or eyes.  Make sure to get the flu shot every year.  Contact a doctor if:  Your symptoms do not get better in 2 weeks. Get help right away if:  You cough up blood.  You have chest pain.  You have very bad shortness of breath.  You become dehydrated.  You faint (pass out) or keep feeling like you are going to pass out.  You keep throwing up (vomiting).  You have a very bad headache.  Your fever or chills gets worse. This information is not intended to replace advice given to you by your health care provider. Make sure you discuss any questions you have with your health care provider. Document Released: 10/15/2007 Document Revised:  12/05/2015 Document Reviewed: 10/17/2015 Elsevier Interactive Patient Education  2018 [employer]. Otitis Media, Adult Otitis media is redness, soreness, and puffiness (swelling) in the space just behind your eardrum (middle ear). It may be caused by allergies or infection. It often happens along with a cold. Follow these instructions at home:  Take your medicine as told. Finish it even if you start to feel better.  Only take over-the-counter or prescription medicines for pain, discomfort, or fever as told by your doctor.  Follow up  with your doctor as told. Contact a doctor if:  You have otitis media only in one ear, or bleeding from your nose, or both.  You notice a lump on your neck.  You are not getting better in 3-5 days.  You feel worse instead of better. Get help right away if:  You have pain that is not helped with medicine.  You have puffiness, redness, or pain around your ear.  You get a stiff neck.  You cannot move part of your face (paralysis).  You notice that the bone behind your ear hurts when you touch it. This information is not intended to replace advice given to you by your health care provider. Make sure you discuss any questions you have with your health care provider. Document Released: 10/15/2007 Document Revised: 10/04/2015 Document Reviewed: 11/23/2012 Elsevier Interactive Patient Education  2017 [employer].

## 2018-04-05 NOTE — Progress Notes (Signed)
Subjective:    Patient ID: Allison [name], female    DOB: 12-15-1985, 32 y.o.   MRN: [id number]  HPI 32 yo female in non acute distress.  Presents today with complaints sinece 2 weeks  With symptoms oc cough productive green now resolved to a white color, no nasal congestion, chills  When it first started but none now, no fever , denies shortness of breath or chest pain.  Soreness in stomach due to coughing so much.Yesterday with right ear feeling like it needed to pop yesterday and with mild pain. Left ear with a little pain last night. Took Dayquil  At  8 am..  Smoker 1/2 ppd, not  1-2 per day now.    Patient not taking blood pressure medication x one week. Blood pressure (!) 160/98, pulse 100, temperature 98.5 F (36.9 C), temperature source Tympanic, resp. rate 18, weight 213 lb (96.6 kg), last menstrual period 03/06/2018, SpO2 98 %.  Review of Systems  Constitutional: Positive for chills. Negative for fatigue and fever.  HENT: Positive for ear pain, sore throat and voice change (hoarse voice). Negative for congestion, postnasal drip, rhinorrhea, sinus pressure, sinus pain and sneezing.   Eyes: Negative for discharge and itching.  Respiratory: Negative for cough, chest tightness, shortness of breath and wheezing.   Cardiovascular: Negative for chest pain, palpitations and leg swelling.  Gastrointestinal: Negative for abdominal pain, diarrhea, nausea and vomiting.  Endocrine: Negative for polydipsia, polyphagia and polyuria.  Genitourinary: Negative for dysuria.  Musculoskeletal: Positive for myalgias.  Skin: Positive for rash.  Allergic/Immunologic: Positive for environmental allergies. Negative for food allergies.  Neurological: Positive for headaches (forehead). Negative for dizziness, syncope and light-headedness.  Hematological: Negative for adenopathy.  Psychiatric/Behavioral: Negative for behavioral problems, confusion, self-injury and suicidal ideas.        Objective:   Physical Exam  Constitutional: She is oriented to person, place, and time. She appears well-developed and well-nourished.  HENT:  Head: Normocephalic and atraumatic.  Right Ear: Hearing, external ear and ear canal normal. Tympanic membrane is erythematous. A middle ear effusion is present.  Left Ear: Hearing, external ear and ear canal normal. Tympanic membrane is erythematous. A middle ear effusion is present.  Nose: Mucosal edema and rhinorrhea present.  Mouth/Throat: Uvula is midline and mucous membranes are normal. Uvula swelling present. Posterior oropharyngeal erythema present. No oropharyngeal exudate or posterior oropharyngeal edema. Tonsils are 2+ on the right. Tonsils are 2+ on the left.  Eyes: Pupils are equal, round, and reactive to light. Conjunctivae and EOM are normal.  Neck: Normal range of motion. Neck supple.  Cardiovascular: Normal rate, regular rhythm and normal heart sounds.  Pulmonary/Chest: Effort normal and breath sounds normal.  Lymphadenopathy:    She has no cervical adenopathy.  Neurological: She is alert and oriented to person, place, and time.  Skin: Skin is warm and dry.  Psychiatric: She has a normal mood and affect. Her behavior is normal. Judgment and thought content normal.  Nursing note and vitals reviewed.   Coughing in room       Assessment & Plan:  Bronchitis, Otitis Media Bilaterally, pharyngitis Smoker Declines work note. Meds ordered this encounter  Medications  . benzonatate (TESSALON PERLES) 100 MG capsule    Sig: Take 1 capsule (100 mg total) by mouth 3 (three) times daily as needed.    Dispense:  30 capsule    Refill:  0  . amoxicillin-clavulanate (AUGMENTIN) 875-125 MG tablet    Sig: Take 1 tablet by mouth  2 (two) times daily.    Dispense:  20 tablet    Refill:  0   Encouraged patient to quit smoking. Stop Dayquil and Nyquil and take medication as prescribed. Encourged patient to set alarm on her phone if she could  not remember to take her daily medications.Reviewed the importance of her blood pressure medication and diabetes medi ation. To rest and increase fluids, avoid acidic foods or spicy foods due to throat. Dilute salt water gargles  3-4 times /day. Return in 3-5 days if not improving or worsening . If clinic is closed to seek out medical care at an urgent care or the Emergency Department.  Patient verbalizes understanding and has no questions at discharge. Encouraged patient to be seen sooner if she gets sick like this again.

## 2018-04-13 ENCOUNTER — Ambulatory Visit: Payer: Medicaid Other | Admitting: Medical

## 2018-04-13 ENCOUNTER — Encounter: Payer: Self-pay | Admitting: Medical

## 2018-04-13 VITALS — BP 166/90 | HR 101 | Temp 98.1°F | Resp 18 | Wt 213.2 lb

## 2018-04-13 DIAGNOSIS — J9801 Acute bronchospasm: Secondary | ICD-10-CM

## 2018-04-13 DIAGNOSIS — H6983 Other specified disorders of Eustachian tube, bilateral: Secondary | ICD-10-CM

## 2018-04-13 DIAGNOSIS — R059 Cough, unspecified: Secondary | ICD-10-CM

## 2018-04-13 DIAGNOSIS — Z91199 Patient's noncompliance with other medical treatment and regimen due to unspecified reason: Secondary | ICD-10-CM

## 2018-04-13 DIAGNOSIS — R05 Cough: Secondary | ICD-10-CM

## 2018-04-13 DIAGNOSIS — Z9119 Patient's noncompliance with other medical treatment and regimen: Secondary | ICD-10-CM

## 2018-04-13 MED ORDER — FLUTICASONE PROPIONATE 50 MCG/ACT NA SUSP: 2.0000 | Freq: Every day | NASAL | 6 refills | Status: DC

## 2018-04-13 MED ORDER — ALBUTEROL SULFATE HFA 108 (90 BASE) MCG/ACT IN AERS: 2.0000 | INHALATION_SPRAY | Freq: Four times a day (QID) | RESPIRATORY_TRACT | 0 refills | Status: DC | PRN

## 2018-04-13 NOTE — Patient Instructions (Signed)
Fluticasone nasal spray What is this medicine? FLUTICASONE (floo TIK a sone) is a corticosteroid. This medicine is used to treat the symptoms of allergies like sneezing, itchy red eyes, and itchy, runny, or stuffy nose. This medicine is also used to treat nasal polyps. This medicine may be used for other purposes; ask your health care provider or pharmacist if you have questions. COMMON BRAND NAME(S): Flonase, Flonase Allergy Relief, Flonase Sensimist, Veramyst, XHANCE What should I tell my health care provider before I take this medicine? They need to know if you have any of these conditions: -cataracts -glaucoma -infection, like tuberculosis, herpes, or fungal infection -recent surgery on nose or sinuses -taking a corticosteroid by mouth -an unusual or allergic reaction to fluticasone, steroids, other medicines, foods, dyes, or preservatives -pregnant or trying to get pregnant -breast-feeding How should I use this medicine? This medicine is for use in the nose. Follow the directions on your product or prescription label. This medicine works best if used at regular intervals. Do not use more often than directed. Make sure that you are using your nasal spray correctly. After 6 months of daily use for allergies, talk to your doctor or health care professional before using it for a longer time. Ask your doctor or health care professional if you have any questions. Talk to your pediatrician regarding the use of this medicine in children. Special care may be needed. Some products have been used for allergies in children as young as 2 years. After 2 months of daily use without a prescription in a child, talk to your pediatrician before using it for a longer time. Use of this medicine for nasal polyps is not approved in children. Overdosage: If you think you have taken too much of this medicine contact a poison control center or emergency room at once. NOTE: This medicine is only for you. Do not share  this medicine with others. What if I miss a dose? If you miss a dose, use it as soon as you remember. If it is almost time for your next dose, use only that dose and continue with your regular schedule. Do not use double or extra doses. What may interact with this medicine? -certain antibiotics like clarithromycin and telithromycin -certain medicines for fungal infections like ketoconazole, itraconazole, and voriconazole -conivaptan -nefazodone -some medicines for HIV -vaccines This list may not describe all possible interactions. Give your health care provider a list of all the medicines, herbs, non-prescription drugs, or dietary supplements you use. Also tell them if you smoke, drink alcohol, or use illegal drugs. Some items may interact with your medicine. What should I watch for while using this medicine? Visit your doctor or health care professional for regular checks on your progress. Some symptoms may improve within 12 hours after starting use. Check with your doctor or health care professional if there is no improvement in your symptoms after 3 weeks of use. This medicine may increase your risk of getting an infection. Tell your doctor or health care professional if you are around anyone with measles or chickenpox, or if you develop sores or blisters that do not heal properly. What side effects may I notice from receiving this medicine? Side effects that you should report to your doctor or health care professional as soon as possible: -allergic reactions like skin rash, itching or hives, swelling of the face, lips, or tongue -changes in vision -crusting or sores in the nose -nosebleed -signs and symptoms of infection like fever or chills; cough; sore   throat -white patches or sores in the mouth or nose Side effects that usually do not require medical attention (report to your doctor or health care professional if they continue or are bothersome): -burning or irritation inside the nose  or throat -cough -headache -unusual taste or smell This list may not describe all possible side effects. Call your doctor for medical advice about side effects. You may report side effects to FDA at 1-800-FDA-1088. Where should I keep my medicine? Keep out of the reach of children. Store at room temperature between 15 and 30 degrees C (59 and 86 degrees F). Avoid exposure to extreme heat, cold, or light. Throw away any unused medicine after the expiration date. NOTE: This sheet is a summary. It may not cover all possible information. If you have questions about this medicine, talk to your doctor, pharmacist, or health care provider.  2018 Elsevier/Gold Standard (2016-02-08 14:23:12) Albuterol inhalation aerosol What is this medicine? ALBUTEROL (al Gaspar Bidding) is a bronchodilator. It helps open up the airways in your lungs to make it easier to breathe. This medicine is used to treat and to prevent bronchospasm. This medicine may be used for other purposes; ask your health care provider or pharmacist if you have questions. COMMON BRAND NAME(S): Proair HFA, Proventil, Proventil HFA, Respirol, Ventolin, Ventolin HFA What should I tell my health care provider before I take this medicine? They need to know if you have any of the following conditions: -diabetes -heart disease or irregular heartbeat -high blood pressure -pheochromocytoma -seizures -thyroid disease -an unusual or allergic reaction to albuterol, levalbuterol, sulfites, other medicines, foods, dyes, or preservatives -pregnant or trying to get pregnant -breast-feeding How should I use this medicine? This medicine is for inhalation through the mouth. Follow the directions on your prescription label. Take your medicine at regular intervals. Do not use more often than directed. Make sure that you are using your inhaler correctly. Ask you doctor or health care provider if you have any questions. Talk to your pediatrician regarding the  use of this medicine in children. Special care may be needed. Overdosage: If you think you have taken too much of this medicine contact a poison control center or emergency room at once. NOTE: This medicine is only for you. Do not share this medicine with others. What if I miss a dose? If you miss a dose, use it as soon as you can. If it is almost time for your next dose, use only that dose. Do not use double or extra doses. What may interact with this medicine? -anti-infectives like chloroquine and pentamidine -caffeine -cisapride -diuretics -medicines for colds -medicines for depression or for emotional or psychotic conditions -medicines for weight loss including some herbal products -methadone -some antibiotics like clarithromycin, erythromycin, levofloxacin, and linezolid -some heart medicines -steroid hormones like dexamethasone, cortisone, hydrocortisone -theophylline -thyroid hormones This list may not describe all possible interactions. Give your health care provider a list of all the medicines, herbs, non-prescription drugs, or dietary supplements you use. Also tell them if you smoke, drink alcohol, or use illegal drugs. Some items may interact with your medicine. What should I watch for while using this medicine? Tell your doctor or health care professional if your symptoms do not improve. Do not use extra albuterol. If your asthma or bronchitis gets worse while you are using this medicine, call your doctor right away. If your mouth gets dry try chewing sugarless gum or sucking hard candy. Drink water as directed. What side effects may  I notice from receiving this medicine? Side effects that you should report to your doctor or health care professional as soon as possible: -allergic reactions like skin rash, itching or hives, swelling of the face, lips, or tongue -breathing problems -chest pain -feeling faint or lightheaded, falls -high blood pressure -irregular  heartbeat -fever -muscle cramps or weakness -pain, tingling, numbness in the hands or feet -vomiting Side effects that usually do not require medical attention (report to your doctor or health care professional if they continue or are bothersome): -cough -difficulty sleeping -headache -nervousness or trembling -stomach upset -stuffy or runny nose -throat irritation -unusual taste This list may not describe all possible side effects. Call your doctor for medical advice about side effects. You may report side effects to FDA at 1-800-FDA-1088. Where should I keep my medicine? Keep out of the reach of children. Store at room temperature between 15 and 30 degrees C (59 and 86 degrees F). The contents are under pressure and may burst when exposed to heat or flame. Do not freeze. This medicine does not work as well if it is too cold. Throw away any unused medicine after the expiration date. Inhalers need to be thrown away after the labeled number of puffs have been used or by the expiration date; whichever comes first. Ventolin HFA should be thrown away 12 months after removing from foil pouch. Check the instructions that come with your medicine. NOTE: This sheet is a summary. It may not cover all possible information. If you have questions about this medicine, talk to your doctor, pharmacist, or health care provider.  2018 Elsevier/Gold Standard (2012-10-14 10:57:17) Eustachian Tube Dysfunction The eustachian tube connects the middle ear to the back of the nose. It regulates air pressure in the middle ear by allowing air to move between the ear and nose. It also helps to drain fluid from the middle ear space. When the eustachian tube does not function properly, air pressure, fluid, or both can build up in the middle ear. Eustachian tube dysfunction can affect one or both ears. What are the causes? This condition happens when the eustachian tube becomes blocked or cannot open normally. This may  result from:  Ear infections.  Colds and other upper respiratory infections.  Allergies.  Irritation, such as from cigarette smoke or acid from the stomach coming up into the esophagus (gastroesophageal reflux).  Sudden changes in air pressure, such as from descending in an airplane.  Abnormal growths in the nose or throat, such as nasal polyps, tumors, or enlarged tissue at the back of the throat (adenoids).  What increases the risk? This condition may be more likely to develop in people who smoke and people who are overweight. Eustachian tube dysfunction may also be more likely to develop in children, especially children who have:  Certain birth defects of the mouth, such as cleft palate.  Large tonsils and adenoids.  What are the signs or symptoms? Symptoms of this condition may include:  A feeling of fullness in the ear.  Ear pain.  Clicking or popping noises in the ear.  Ringing in the ear.  Hearing loss.  Loss of balance.  Symptoms may get worse when the air pressure around you changes, such as when you travel to an area of high elevation or fly on an airplane. How is this diagnosed? This condition may be diagnosed based on:  Your symptoms.  A physical exam of your ear, nose, and throat.  Tests, such as those that measure: ?  The movement of your eardrum (tympanogram). ? Your hearing (audiometry).  How is this treated? Treatment depends on the cause and severity of your condition. If your symptoms are mild, you may be able to relieve your symptoms by moving air into ("popping") your ears. If you have symptoms of fluid in your ears, treatment may include:  Decongestants.  Antihistamines.  Nasal sprays or ear drops that contain medicines that reduce swelling (steroids).  In some cases, you may need to have a procedure to drain the fluid in your eardrum (myringotomy). In this procedure, a small tube is placed in the eardrum to:  Drain the fluid.  Restore  the air in the middle ear space.  Follow these instructions at home:  Take over-the-counter and prescription medicines only as told by your health care provider.  Use techniques to help pop your ears as recommended by your health care provider. These may include: ? Chewing gum. ? Yawning. ? Frequent, forceful swallowing. ? Closing your mouth, holding your nose closed, and gently blowing as if you are trying to blow air out of your nose.  Do not do any of the following until your health care provider approves: ? Travel to high altitudes. ? Fly in airplanes. ? Work in a Estate agent or room. ? Scuba dive.  Keep your ears dry. Dry your ears completely after showering or bathing.  Do not smoke.  Keep all follow-up visits as told by your health care provider. This is important. Contact a health care provider if:  Your symptoms do not go away after treatment.  Your symptoms come back after treatment.  You are unable to pop your ears.  You have: ? A fever. ? Pain in your ear. ? Pain in your head or neck. ? Fluid draining from your ear.  Your hearing suddenly changes.  You become very dizzy.  You lose your balance. This information is not intended to replace advice given to you by your health care provider. Make sure you discuss any questions you have with your health care provider. Document Released: 05/25/2015 Document Revised: 10/04/2015 Document Reviewed: 05/17/2014 Elsevier Interactive Patient Education  2018 [employer]. Bronchospasm, Adult Bronchospasm is when airways in the lungs get smaller. When this happens, it can be hard to breathe. You may cough. You may also make a whistling sound when you breathe (wheeze). Follow these instructions at home: Medicines  Take over-the-counter and prescription medicines only as told by your doctor.  If you need to use an inhaler or nebulizer to take your medicine, ask your doctor how to use it.  If you were given a  spacer, always use it with your inhaler. Lifestyle  Change your heating and air conditioning filter. Do this at least once a month.  Try not to use fireplaces and wood stoves.  Do not  smoke. Do not  allow smoking in your home.  Try not to use things that have a strong smell, like perfume.  Get rid of pests (such as roaches and mice) and their poop.  Remove any mold from your home.  Keep your house clean. Get rid of dust.  Use cleaning products that have no smell.  Replace carpet with wood, tile, or vinyl flooring.  Use allergy-proof pillows, mattress covers, and box spring covers.  Wash bed sheets and blankets every week. Use hot water. Dry them in a dryer.  Use blankets that are made of polyester or cotton.  Wash your hands often.  Keep pets out of  your bedroom.  When you exercise, try not to breathe in cold air. General instructions  Have a plan for getting medical care. Know these things: ? When to call your doctor. ? When to call local emergency services (911 in the [place] ? Where to go in an emergency.  Stay up to date on your shots (immunizations).  When you have an episode: ? Stay calm. ? Relax. ? Breathe slowly. Contact a doctor if:  Your muscles ache.  Your chest hurts.  The color of the mucus you cough up (sputum) changes from clear or white to yellow, green, gray, or bloody.  The mucus you cough up gets thicker.  You have a fever. Get help right away if:  The whistling sound gets worse, even after you take your medicines.  Your coughing gets worse.  You find it even harder to breathe.  Your chest hurts very much. Summary  Bronchospasm is when airways in the lungs get smaller.  When this happens, it can be hard to breathe. You may cough. You may also make a whistling sound when you breathe.  Stay away from things that cause you to have episodes. These include smoke or dust. This information is not intended to replace advice given to  you by your health care provider. Make sure you discuss any questions you have with your health care provider. Document Released: 02/23/2009 Document Revised: 05/01/2016 Document Reviewed: 05/01/2016 Elsevier Interactive Patient Education  2017 [employer].

## 2018-04-13 NOTE — Progress Notes (Signed)
Subjective:    Patient ID: Allison [name], female    DOB: 04-15-1986, 32 y.o.   MRN: [id number]  HPI 32 yo female in non acute distress. Seen on 04/05/18 diagnosed with Bronchitis, Bilateral otitis media and pharyngitis placed on Augmentin and Tessalon Perles.. Returns today to clinic because she feels she is not getting as better as she should, does feel better as far as her ears and her throat. Still coughing some , but states that is better as well. Called out sick on Sunday and  Left early yesterday because after coughing she vomited on  3 separate occasions. Having body aches, but not taking anything like Motrin or Tylenol. Denies fever but did have some chills yesterday.Denies chest pain , did get short of breath with climbing  3 floors in her house. She said she was up and down the stairs a lot yesterday. But just on the cough she is not short of breath. Productive white. Cough, except int he morning with a light green. She states she knows she does not haave the flu because she has had no fever.   Blood pressure (!) 166/90, pulse (!) 101, temperature 98.1 F (36.7 C), temperature source Tympanic, resp. rate 18, weight 213 lb 3.2 oz (96.7 kg), SpO2 98 %. Did not take blood pressure medication today.   Review of Systems  Constitutional: Positive for chills (last night). Negative for fever.  HENT: Positive for postnasal drip, rhinorrhea, sore throat (better except with bad coughing) and voice change (better). Negative for congestion.   Eyes: Negative for discharge and itching.  Respiratory: Positive for cough (but improving, productive white, in the morning  slightly green) and shortness of breath (this weekend  going up and down stops she has a  3 floor house). Negative for wheezing.   Cardiovascular: Negative for chest pain, palpitations and leg swelling.  Gastrointestinal: Positive for abdominal pain (sore from coughing), diarrhea (Friday and Saturday 5-6 times last bowel movement   seemed normal  , no blood in stools.), nausea (taking anti nausea pills given to her by her mother , helps alot.) and vomiting (mucus after coughing  x 3 last night ,  1 time today after getting up.).  Endocrine: Negative for polydipsia, polyphagia and polyuria.  Genitourinary: Negative for dysuria.  Musculoskeletal: Negative for myalgias.  Skin: Negative for rash.  Allergic/Immunologic: Positive for environmental allergies. Negative for food allergies.  Neurological: Positive for light-headedness (looked up last night and felt, lightheaded  ) and headaches (top of head). Negative for dizziness and syncope.  Hematological: Negative for adenopathy.  Psychiatric/Behavioral: Negative for behavioral problems, self-injury and suicidal ideas.   Not taking insulin  And  Hypertensive medication but not daily.Has not taken her blood pressure medication the last 3 days.     called out on Sunday and Monday left  3am and went home (7am is her regular shift).  Previous history opf being prescribed an inhaler but felt better the next day and so she did not get it.   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    Smoking still 1-2 today yesterday  3 at work. Ate a burger 1 hour ago.  Objective:   Physical Exam  Constitutional: She appears well-developed and well-nourished.  HENT:  Head: Normocephalic and atraumatic.  Right Ear: External ear normal.  Left Ear: External ear normal.  Eyes: Pupils are equal, round, and reactive to light. Conjunctivae and EOM are  normal.  Neck: Normal range of motion. Neck supple. Tracheal deviation present.  Cardiovascular: Normal rate, regular rhythm and normal heart sounds.  Pulmonary/Chest: Effort normal and breath sounds normal.  Lymphadenopathy:    She has no cervical adenopathy.  Nursing note and vitals reviewed.     Mild Cough noted in room      Assessment & Plan:  Eustachian tube dysfunction  Cough  Bronchospasm I  did not do steroids due to patient being a diabetic. noncomplianat with her diabetic and hypertensive medication.  Reviewed with patient at last visit how important her diabetic and high blood pressure medications is, suggested she put an alarm on her phone, which she says she did , but she just did not feel like taking them. Again reviewed with patient the importance of taking her diabetic medication , that sugar levels can increase with illness , and risk of stroke and heart attack with elevated blood pressure.   Recommend  OTC Claritin and Flonase take per packaage directions to help with ETD and to help dry up post nasal drip. Motrin 800 mg  Every 8 hours as needed for muscle aches and if not feeling well , to take with food. Reviewed with  Patient to decrease and/jor stop smoking. Given Motrin 800 mg in clinic lot [id number] exp 2022-1  And  Loratadine lot [id number] exp 2020-12 Asks for work note given note given work note  for  2 days., to return in 2-3 days if not improving or to follow up with her PCP. Patient verbalizes understanding of all instructions and has no questions at discharge.

## 2018-05-19 ENCOUNTER — Ambulatory Visit: Payer: Medicaid Other | Admitting: Medical

## 2018-06-15 ENCOUNTER — Ambulatory Visit: Payer: BLUE CROSS/BLUE SHIELD | Admitting: Family Medicine

## 2018-06-15 ENCOUNTER — Encounter: Payer: Self-pay | Admitting: Family Medicine

## 2018-06-15 VITALS — BP 146/92 | HR 109 | Temp 98.0°F | Resp 16 | Ht 62.0 in | Wt 212.4 lb

## 2018-06-15 DIAGNOSIS — J020 Streptococcal pharyngitis: Secondary | ICD-10-CM | POA: Diagnosis not present

## 2018-06-15 DIAGNOSIS — J029 Acute pharyngitis, unspecified: Secondary | ICD-10-CM | POA: Diagnosis not present

## 2018-06-15 LAB — POCT RAPID STREP A (OFFICE): RAPID STREP A SCREEN: POSITIVE — AB

## 2018-06-15 LAB — POC INFLUENZA A&B (BINAX/QUICKVUE)
INFLUENZA A, POC: NEGATIVE
INFLUENZA B, POC: NEGATIVE

## 2018-06-15 MED ORDER — AMOXICILLIN 500 MG PO CAPS: 500.0000 mg | ORAL_CAPSULE | Freq: Two times a day (BID) | ORAL | 0 refills | Status: DC

## 2018-06-15 NOTE — Progress Notes (Signed)
Subjective:    Patient ID: Allison [name], female    DOB: Sep 14, 1985, 33 y.o.   MRN: [id number]  HPI   Patient presents to clinic planing of chills, fever, fatigue, aches, sore throat for the past 2 days.  Patient describes the pain in her throat as if "razor blades are in back of throat".  Patient states her son has been sick off and on for the past few months, this is his first year in kindergarten so has been a difficult cold and flu season for the family.  Patient denies any cough, shortness of breath or wheezing.  Denies chest pain.  Denies nausea/vomiting or diarrhea.  Patient Active Problem List   Diagnosis Date Noted  . Stress 09/18/2017  . Carpal tunnel syndrome 04/14/2017  . Noncompliance 11/26/2016  . Morbid obesity with BMI of 40.0-44.9, adult (HCC) 08/25/2016  . Benign essential HTN 05/20/2015  . PTSD (post-traumatic stress disorder) 06/22/2014  . HLD (hyperlipidemia) 05/31/2013  . Type 2 diabetes mellitus without complication, with long-term current use of insulin (HCC) 05/31/2013  . Tobacco abuse counseling 05/31/2013    Review of Systems  Constitutional: +chills, fatigue and fever.  HENT: Negative for congestion, ear pain, sinus pain. +sore throat.   Eyes: Negative.   Respiratory: Negative for cough, shortness of breath and wheezing.   Cardiovascular: Negative for chest pain, palpitations and leg swelling.  Gastrointestinal: Negative for abdominal pain, diarrhea, nausea and vomiting.  Genitourinary: Negative for dysuria, frequency and urgency.  Musculoskeletal: + body aches  Skin: Negative for color change, pallor and rash.  Neurological: Negative for syncope, light-headedness and headaches.  Psychiatric/Behavioral: The patient is not nervous/anxious.       Objective:   Physical Exam Vitals signs and nursing note reviewed.  Constitutional:      General: She is not in acute distress.    Appearance: She is not toxic-appearing or diaphoretic.  HENT:    Head: Normocephalic and atraumatic.     Ears:     Comments: Fullness bilat TMs    Mouth/Throat:     Mouth: Mucous membranes are moist.     Pharynx: Uvula midline. Posterior oropharyngeal erythema present. No uvula swelling.     Tonsils: Tonsillar exudate present. No tonsillar abscesses. Swelling: 2+ on the right. 2+ on the left.  Eyes:     Conjunctiva/sclera: Conjunctivae normal.     Pupils: Pupils are equal, round, and reactive to light.  Neck:     Musculoskeletal: Neck supple.  Cardiovascular:     Rate and Rhythm: Regular rhythm. Tachycardia present.     Heart sounds: Normal heart sounds.  Pulmonary:     Effort: Pulmonary effort is normal. No respiratory distress.     Breath sounds: Normal breath sounds. No wheezing, rhonchi or rales.  Lymphadenopathy:     Cervical: Cervical adenopathy present.  Skin:    General: Skin is warm and dry.     Coloration: Skin is not pale.  Neurological:     Mental Status: She is alert and oriented to person, place, and time.  Psychiatric:        Behavior: Behavior normal.     Vitals:   06/15/18 1356  BP: (!) 146/92  Pulse: (!) 109  Resp: 16  Temp: 98 F (36.7 C)  SpO2: 98%       Assessment & Plan:   Strep throat-patient's point-of-care flu swab is negative in clinic.  Point-of-care strep a swab is positive.  Patient's physical exam and symptoms are also  consistent with strep throat.  She will take amoxicillin twice daily for 10 days to treat this.  Advised to rest, increase fluid intake and do good handwashing.  Also advised to purchase a new toothbrush after 24 hours on antibiotic.  Patient aware she can alternate Tylenol and Motrin as needed to help reduce pain and fever.  Patient will keep regularly scheduled follow-up with PCP as planned.  Advised to return to clinic sooner if any issues arise.

## 2018-06-18 ENCOUNTER — Other Ambulatory Visit: Payer: Self-pay | Admitting: Family Medicine

## 2018-06-18 DIAGNOSIS — I10 Essential (primary) hypertension: Secondary | ICD-10-CM

## 2018-07-07 ENCOUNTER — Other Ambulatory Visit: Payer: Self-pay

## 2018-07-07 DIAGNOSIS — Z794 Long term (current) use of insulin: Principal | ICD-10-CM

## 2018-07-07 DIAGNOSIS — E119 Type 2 diabetes mellitus without complications: Secondary | ICD-10-CM

## 2018-07-07 NOTE — Telephone Encounter (Signed)
A refill request came in for lantus, The RX was originally written for 20 units at 10 pm, then it was changed to 18 units, the pt reported she is taking 22 units, therefore I do not know what to order.  Please advise.  Coralee North, CMA

## 2018-07-08 MED ORDER — INSULIN GLARGINE 100 UNIT/ML SOLOSTAR PEN: 22.0000 [IU] | PEN_INJECTOR | Freq: Every day | SUBCUTANEOUS | 1 refills | Status: DC

## 2018-07-08 NOTE — Telephone Encounter (Signed)
Called and LMTCB. Ok for [facility] to advise.  Nina,CMA

## 2018-07-08 NOTE — Telephone Encounter (Signed)
Based on my last note it looks like she is taking 22 units.  I sent a refill in.  Please contact her to try to get her set up for follow-up as she has not been seen by me since May 2019.

## 2018-07-21 ENCOUNTER — Ambulatory Visit (INDEPENDENT_AMBULATORY_CARE_PROVIDER_SITE_OTHER): Payer: BLUE CROSS/BLUE SHIELD | Admitting: Obstetrics and Gynecology

## 2018-07-21 ENCOUNTER — Other Ambulatory Visit: Payer: Self-pay

## 2018-07-21 ENCOUNTER — Encounter: Payer: Self-pay | Admitting: Obstetrics and Gynecology

## 2018-07-21 VITALS — BP 158/111 | HR 98 | Ht 62.0 in | Wt 209.6 lb

## 2018-07-21 DIAGNOSIS — N946 Dysmenorrhea, unspecified: Secondary | ICD-10-CM

## 2018-07-21 DIAGNOSIS — I10 Essential (primary) hypertension: Secondary | ICD-10-CM | POA: Diagnosis not present

## 2018-07-21 DIAGNOSIS — Z8639 Personal history of other endocrine, nutritional and metabolic disease: Secondary | ICD-10-CM

## 2018-07-21 DIAGNOSIS — N920 Excessive and frequent menstruation with regular cycle: Secondary | ICD-10-CM | POA: Diagnosis not present

## 2018-07-21 DIAGNOSIS — B373 Candidiasis of vulva and vagina: Secondary | ICD-10-CM | POA: Diagnosis not present

## 2018-07-21 DIAGNOSIS — Z6838 Body mass index (BMI) 38.0-38.9, adult: Secondary | ICD-10-CM

## 2018-07-21 DIAGNOSIS — E119 Type 2 diabetes mellitus without complications: Secondary | ICD-10-CM | POA: Diagnosis not present

## 2018-07-21 DIAGNOSIS — Z794 Long term (current) use of insulin: Secondary | ICD-10-CM

## 2018-07-21 DIAGNOSIS — F3289 Other specified depressive episodes: Secondary | ICD-10-CM | POA: Diagnosis not present

## 2018-07-21 DIAGNOSIS — F3281 Premenstrual dysphoric disorder: Secondary | ICD-10-CM

## 2018-07-21 DIAGNOSIS — B3731 Acute candidiasis of vulva and vagina: Secondary | ICD-10-CM

## 2018-07-21 MED ORDER — LISINOPRIL 5 MG PO TABS: 5.0000 mg | ORAL_TABLET | Freq: Every day | ORAL | 2 refills | Status: DC

## 2018-07-21 MED ORDER — HYDROCHLOROTHIAZIDE 25 MG PO TABS: 25.0000 mg | ORAL_TABLET | Freq: Every day | ORAL | 3 refills | Status: DC

## 2018-07-21 MED ORDER — FLUCONAZOLE 150 MG PO TABS: 150.0000 mg | ORAL_TABLET | Freq: Once | ORAL | 3 refills | Status: AC

## 2018-07-21 MED ORDER — BUPROPION HCL ER (XL) 150 MG PO TB24: 150.0000 mg | ORAL_TABLET | Freq: Every day | ORAL | 6 refills | Status: DC

## 2018-07-21 NOTE — Patient Instructions (Signed)
Vaginal Yeast infection, Adult  Vaginal yeast infection is a condition that causes vaginal discharge as well as soreness, swelling, and redness (inflammation) of the vagina. This is a common condition. Some women get this infection frequently. What are the causes? This condition is caused by a change in the normal balance of the yeast (candida) and bacteria that live in the vagina. This change causes an overgrowth of yeast, which causes the inflammation. What increases the risk? The condition is more likely to develop in women who:  Take antibiotic medicines.  Have diabetes.  Take birth control pills.  Are pregnant.  Douche often.  Have a weak body defense system (immune system).  Have been taking steroid medicines for a long time.  Frequently wear tight clothing. What are the signs or symptoms? Symptoms of this condition include:  White, thick, creamy vaginal discharge.  Swelling, itching, redness, and irritation of the vagina. The lips of the vagina (vulva) may be affected as well.  Pain or a burning feeling while urinating.  Pain during sex. How is this diagnosed? This condition is diagnosed based on:  Your medical history.  A physical exam.  A pelvic exam. Your health care provider will examine a sample of your vaginal discharge under a microscope. Your health care provider may send this sample for testing to confirm the diagnosis. How is this treated? This condition is treated with medicine. Medicines may be over-the-counter or prescription. You may be told to use one or more of the following:  Medicine that is taken by mouth (orally).  Medicine that is applied as a cream (topically).  Medicine that is inserted directly into the vagina (suppository). Follow these instructions at home:  Lifestyle  Do not have sex until your health care provider approves. Tell your sex partner that you have a yeast infection. That person should go to his or her health care  provider and ask if they should also be treated.  Do not wear tight clothes, such as pantyhose or tight pants.  Wear breathable cotton underwear. General instructions  Take or apply over-the-counter and prescription medicines only as told by your health care provider.  Eat more yogurt. This may help to keep your yeast infection from returning.  Do not use tampons until your health care provider approves.  Try taking a sitz bath to help with discomfort. This is a warm water bath that is taken while you are sitting down. The water should only come up to your hips and should cover your buttocks. Do this 3-4 times per day or as told by your health care provider.  Do not douche.  If you have diabetes, keep your blood sugar levels under control.  Keep all follow-up visits as told by your health care provider. This is important. Contact a health care provider if:  You have a fever.  Your symptoms go away and then return.  Your symptoms do not get better with treatment.  Your symptoms get worse.  You have new symptoms.  You develop blisters in or around your vagina.  You have blood coming from your vagina and it is not your menstrual period.  You develop pain in your abdomen. Summary  Vaginal yeast infection is a condition that causes discharge as well as soreness, swelling, and redness (inflammation) of the vagina.  This condition is treated with medicine. Medicines may be over-the-counter or prescription.  Take or apply over-the-counter and prescription medicines only as told by your health care provider.  Do not douche.   Do not have sex or use tampons until your health care provider approves.  Contact a health care provider if your symptoms do not get better with treatment or your symptoms go away and then return. This information is not intended to replace advice given to you by your health care provider. Make sure you discuss any questions you have with your health care  provider. Document Released: 02/05/2005 Document Revised: 09/14/2017 Document Reviewed: 09/14/2017 Elsevier Interactive Patient Education  2019 Elsevier Inc. Major Depressive Disorder, Adult Major depressive disorder (MDD) is a mental health condition. MDD often makes you feel sad, hopeless, or helpless. MDD can also cause symptoms in your body. MDD can affect your:  Work.  School.  Relationships.  Other normal activities. MDD can range from mild to very bad. It may occur once (single episode MDD). It can also occur many times (recurrent MDD). The main symptoms of MDD often include:  Feeling sad, depressed, or irritable most of the time.  Loss of interest. MDD symptoms also include:  Sleeping too much or too little.  Eating too much or too little.  A change in your weight.  Feeling tired (fatigue) or having low energy.  Feeling worthless.  Feeling guilty.  Trouble making decisions.  Trouble thinking clearly.  Thoughts of suicide or harming others.  Feeling weak.  Feeling agitated.  Keeping yourself from being around other people (isolation). Follow these instructions at home: Activity  Do these things as told by your doctor: ? Go back to your normal activities. ? Exercise regularly. ? Spend time outdoors. Alcohol  Talk with your doctor about how alcohol can affect your antidepressant medicines.  Do not drink alcohol. Or, limit how much alcohol you drink. ? This means no more than 1 drink a day for nonpregnant women and 2 drinks a day for men. One drink equals one of these:  12 oz of beer.  5 oz of wine.  1 oz of hard liquor. General instructions  Take over-the-counter and prescription medicines only as told by your doctor.  Eat a healthy diet.  Get plenty of sleep.  Find activities that you enjoy. Make time to do them.  Think about joining a support group. Your doctor may be able to suggest a group for you.  Keep all follow-up visits as  told by your doctor. This is important. Where to find more information:  [employer] on Mental Illness: ? www.nami.org  [place] [employer] of Mental Health: ? http://www.maynard.net/  National Suicide Prevention Lifeline: ? 430-722-4732. This is free, 24-hour help. Contact a doctor if:  Your symptoms get worse.  You have new symptoms. Get help right away if:  You self-harm.  You see, hear, taste, smell, or feel things that are not present (hallucinate). If you ever feel like you may hurt yourself or others, or have thoughts about taking your own life, get help right away. You can go to your nearest emergency department or call:  Your local emergency services (911 in the [place]  A suicide crisis helpline, such as the National Suicide Prevention Lifeline: ? (206)191-3636. This is open 24 hours a day. This information is not intended to replace advice given to you by your health care provider. Make sure you discuss any questions you have with your health care provider. Document Released: 04/09/2015 Document Revised: 01/13/2016 Document Reviewed: 01/13/2016 Elsevier Interactive Patient Education  2019 [employer].
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
## 2018-07-22 ENCOUNTER — Other Ambulatory Visit: Payer: Medicaid Other

## 2018-07-22 DIAGNOSIS — Z0000 Encounter for general adult medical examination without abnormal findings: Secondary | ICD-10-CM

## 2018-07-22 NOTE — Addendum Note (Signed)
Addended by: Berneice Gandy A on: 07/22/2018 12:34 PM   Modules accepted: Orders

## 2018-07-23 ENCOUNTER — Other Ambulatory Visit: Payer: Self-pay | Admitting: Obstetrics and Gynecology

## 2018-07-23 ENCOUNTER — Telehealth: Payer: Self-pay | Admitting: *Deleted

## 2018-07-23 LAB — COMPREHENSIVE METABOLIC PANEL
ALT: 32 IU/L (ref 0–32)
AST: 21 IU/L (ref 0–40)
Albumin/Globulin Ratio: 1.6 (ref 1.2–2.2)
Albumin: 4.5 g/dL (ref 3.8–4.8)
Alkaline Phosphatase: 73 IU/L (ref 39–117)
BUN/Creatinine Ratio: 17 (ref 9–23)
BUN: 12 mg/dL (ref 6–20)
Bilirubin Total: <0.2 mg/dL (ref 0.0–1.2)
CO2: 20 mmol/L (ref 20–29)
Calcium: 9.5 mg/dL (ref 8.7–10.2)
Chloride: 101 mmol/L (ref 96–106)
Creatinine, Ser: 0.71 mg/dL (ref 0.57–1.00)
GFR calc Af Amer: 129 mL/min/{1.73_m2} (ref >59)
GFR calc non Af Amer: 112 mL/min/{1.73_m2} (ref >59)
GLUCOSE: 143 mg/dL — AB (ref 65–99)
Globulin, Total: 2.8 g/dL (ref 1.5–4.5)
Potassium: 4.3 mmol/L (ref 3.5–5.2)
Sodium: 139 mmol/L (ref 134–144)
Total Protein: 7.3 g/dL (ref 6.0–8.5)

## 2018-07-23 LAB — FERRITIN: Ferritin: 74 ng/mL (ref 15–150)

## 2018-07-23 LAB — CBC WITH DIFFERENTIAL/PLATELET
BASOS ABS: 0.1 10*3/uL (ref 0.0–0.2)
Basos: 1 %
EOS (ABSOLUTE): 0.4 10*3/uL (ref 0.0–0.4)
Eos: 3 %
Hematocrit: 44.3 % (ref 34.0–46.6)
Hemoglobin: 14.2 g/dL (ref 11.1–15.9)
Immature Grans (Abs): 0 10*3/uL (ref 0.0–0.1)
Immature Granulocytes: 0 %
Lymphocytes Absolute: 4.2 10*3/uL — ABNORMAL HIGH (ref 0.7–3.1)
Lymphs: 33 %
MCH: 26.4 pg — ABNORMAL LOW (ref 26.6–33.0)
MCHC: 32.1 g/dL (ref 31.5–35.7)
MCV: 83 fL (ref 79–97)
Monocytes Absolute: 0.6 10*3/uL (ref 0.1–0.9)
Monocytes: 5 %
Neutrophils Absolute: 7.4 10*3/uL — ABNORMAL HIGH (ref 1.4–7.0)
Neutrophils: 58 %
Platelets: 384 10*3/uL (ref 150–450)
RBC: 5.37 x10E6/uL — ABNORMAL HIGH (ref 3.77–5.28)
RDW: 14.3 % (ref 11.7–15.4)
WBC: 12.8 10*3/uL — ABNORMAL HIGH (ref 3.4–10.8)

## 2018-07-23 LAB — B12 AND FOLATE PANEL
Folate: 12 ng/mL (ref >3.0)
Vitamin B-12: 390 pg/mL (ref 232–1245)

## 2018-07-23 LAB — THYROID PANEL WITH TSH
Free Thyroxine Index: 1.9 (ref 1.2–4.9)
T3 Uptake Ratio: 24 % (ref 24–39)
T4, Total: 7.9 ug/dL (ref 4.5–12.0)
TSH: 0.841 u[IU]/mL (ref 0.450–4.500)

## 2018-07-23 LAB — HEMOGLOBIN A1C
Est. average glucose Bld gHb Est-mCnc: 214 mg/dL
Hgb A1c MFr Bld: 9.1 % — ABNORMAL HIGH (ref 4.8–5.6)

## 2018-07-23 LAB — VITAMIN D 25 HYDROXY (VIT D DEFICIENCY, FRACTURES): VIT D 25 HYDROXY: 8.5 ng/mL — AB (ref 30.0–100.0)

## 2018-07-23 MED ORDER — VITAMIN D (ERGOCALCIFEROL) 1.25 MG (50000 UNIT) PO CAPS: 50000.0000 [IU] | ORAL_CAPSULE | ORAL | 2 refills | Status: DC

## 2018-07-23 NOTE — Telephone Encounter (Signed)
-----   Message from Purcell Nails, [place] sent at 07/23/2018 10:08 AM EDT ----- Please mail info on vit D

## 2018-07-23 NOTE — Telephone Encounter (Signed)
Mailed all info 

## 2018-08-16 ENCOUNTER — Telehealth: Payer: Self-pay

## 2018-08-16 NOTE — Telephone Encounter (Signed)
Message left for pt- reschedule to afternoon for tele visit for med recheck. Also to call office to reschedule AE 8 weeks out. Mychart message also sent.

## 2018-08-17 ENCOUNTER — Encounter: Payer: Self-pay | Admitting: Obstetrics and Gynecology

## 2018-08-17 ENCOUNTER — Ambulatory Visit (INDEPENDENT_AMBULATORY_CARE_PROVIDER_SITE_OTHER): Payer: BLUE CROSS/BLUE SHIELD | Admitting: Obstetrics and Gynecology

## 2018-08-17 ENCOUNTER — Other Ambulatory Visit: Payer: Self-pay

## 2018-08-17 ENCOUNTER — Encounter: Payer: BLUE CROSS/BLUE SHIELD | Admitting: Obstetrics and Gynecology

## 2018-08-17 VITALS — BP 158/111 | HR 98 | Ht 62.0 in | Wt 209.0 lb

## 2018-08-17 DIAGNOSIS — F3281 Premenstrual dysphoric disorder: Secondary | ICD-10-CM

## 2018-08-17 DIAGNOSIS — E119 Type 2 diabetes mellitus without complications: Secondary | ICD-10-CM

## 2018-08-17 DIAGNOSIS — F3289 Other specified depressive episodes: Secondary | ICD-10-CM

## 2018-08-17 DIAGNOSIS — I10 Essential (primary) hypertension: Secondary | ICD-10-CM

## 2018-08-17 DIAGNOSIS — Z794 Long term (current) use of insulin: Secondary | ICD-10-CM

## 2018-08-17 DIAGNOSIS — Z8639 Personal history of other endocrine, nutritional and metabolic disease: Secondary | ICD-10-CM

## 2018-08-17 MED ORDER — TERCONAZOLE 0.4 % VA CREA: 1.0000 | TOPICAL_CREAM | Freq: Every day | VAGINAL | 2 refills | Status: DC

## 2018-08-17 MED ORDER — DULAGLUTIDE 1.5 MG/0.5ML ~~LOC~~ SOAJ: SUBCUTANEOUS | 2 refills | Status: DC

## 2018-08-17 NOTE — Progress Notes (Signed)
Virtual Visit via Telephone Note  I connected with Allison [name] on 08/17/18 at  2:30 PM EDT by telephone and verified that I am speaking with the correct person using two identifiers.   I discussed the limitations, risks, security and privacy concerns of performing an evaluation and management service by telephone and the availability of in person appointments. I also discussed with the patient that there may be a patient responsible charge related to this service. The patient expressed understanding and agreed to proceed.   History of Present Illness: Seen on 07/21/2018 for multiple concerns, please see note from that visit. Was started back on wellbutrin 150mg , vit D bi-weekly supplements, and HCTZ 25 mg at that time after labs reviewed.   Observations/Objective: Well sounding female in no distress  Has not checked blood pressure as doesn't have a way to check it at home.   States she is sleeping a lot more and even getting 6-7 hours a night. Has decreased working to 2-3 nights a week.  States feels like wellbutrin is working well. Not crying as much and sleeping better. Does feel overwhelmed over the last week with having to help son with school work at home.  Depression screen [facility] 2/9 08/17/2018 07/21/2018 07/23/2016 05/15/2015  Decreased Interest 0 3 0 3  Down, Depressed, Hopeless 0 2 2 3   PHQ - 2 Score 0 5 2 6   Altered sleeping 2 2 3 2   Tired, decreased energy 2 3 2 2   Change in appetite 0 1 2 2   Feeling bad or failure about yourself  0 1 1 2   Trouble concentrating 0 1 0 2  Moving slowly or fidgety/restless 0 1 2 2   Suicidal thoughts 0 0 0 2  PHQ-9 Score 4 14 12 20   Difficult doing work/chores Not difficult at all Somewhat difficult Somewhat difficult Somewhat difficult   States yeast infections are still re-occurring. States diflucan helps some. But they come right back. Just finished menses.and already seeing a lot of thick green discharge. Doesn't notice itching right  now.  Also needs refill on trulicity. Has appointment to see PCP in June. Will need labs repeated before then and can have them done for free on campus [facility].  Assessment and Plan: Essential HTN Type 2 diabetes depression Sleep disturbance Yeast infections Vit D deficiency   Follow Up Instructions: Prescription sent for terazol 7 and instructed on use. Will continue wellbutrin at current dose. Prescription refilled for trulicity and she will restart this week, labs ordered for middle of May.  Order sent for BP home monitor and she will send me readings via MyChart.  RTC as needed. Patient overall happy with how she is feeling and agrees to plan above.    I discussed the assessment and treatment plan with the patient. The patient was provided an opportunity to ask questions and all were answered. The patient agreed with the plan and demonstrated an understanding of the instructions.   The patient was advised to call back or seek an in-person evaluation if the symptoms worsen or if the condition fails to improve as anticipated.  I provided 15 minutes of non-face-to-face time during this encounter.   Allison [name], CNM

## 2018-08-18 IMAGING — CR DG ANKLE COMPLETE 3+V*R*
1 series · 3 of 3 positions shown · non-contrast
Comparison: None.

CLINICAL DATA: 31-year-old female twisted right ankle last night.
Pain and swelling laterally. Initial encounter.

EXAM:
RIGHT ANKLE - COMPLETE 3+ VIEW

[Series 1: dg ankle complete right · 0.14mm/px · 3 of 3 slices shown]
[im 1/3]
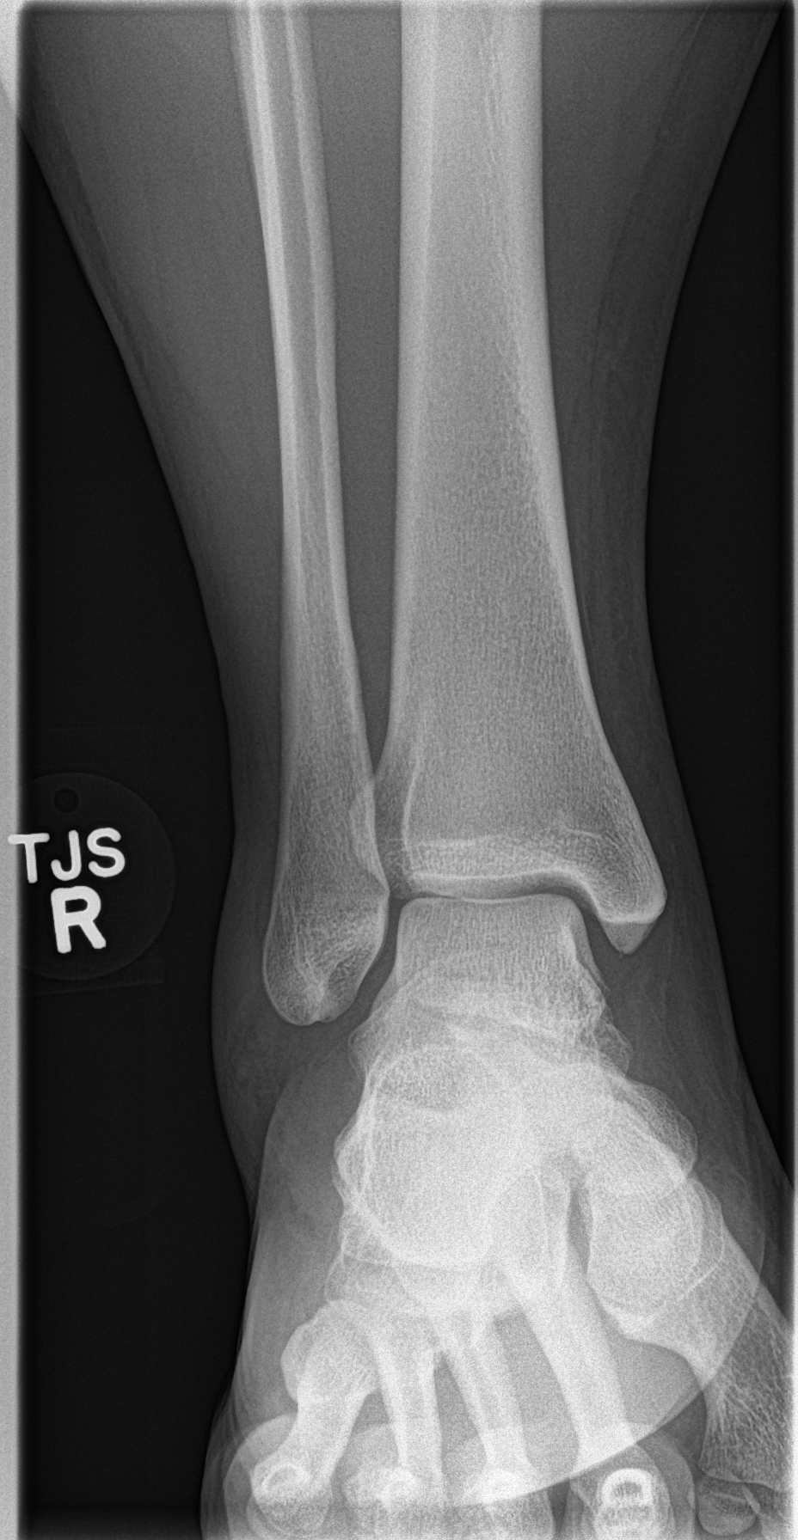
[im 2/3]
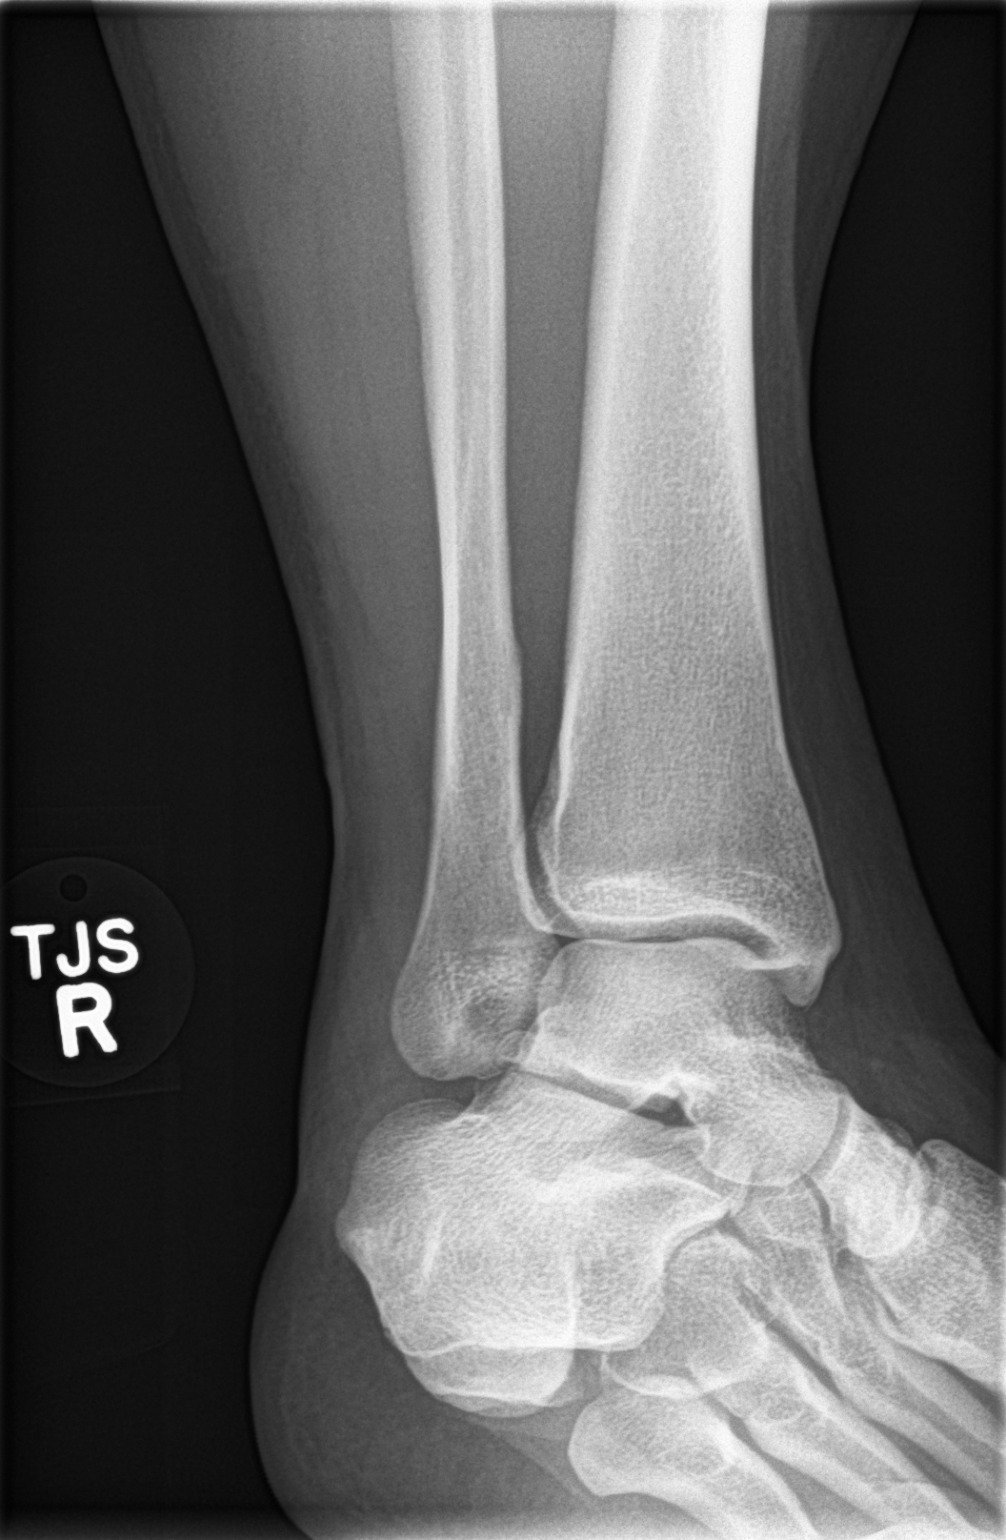
[im 3/3]
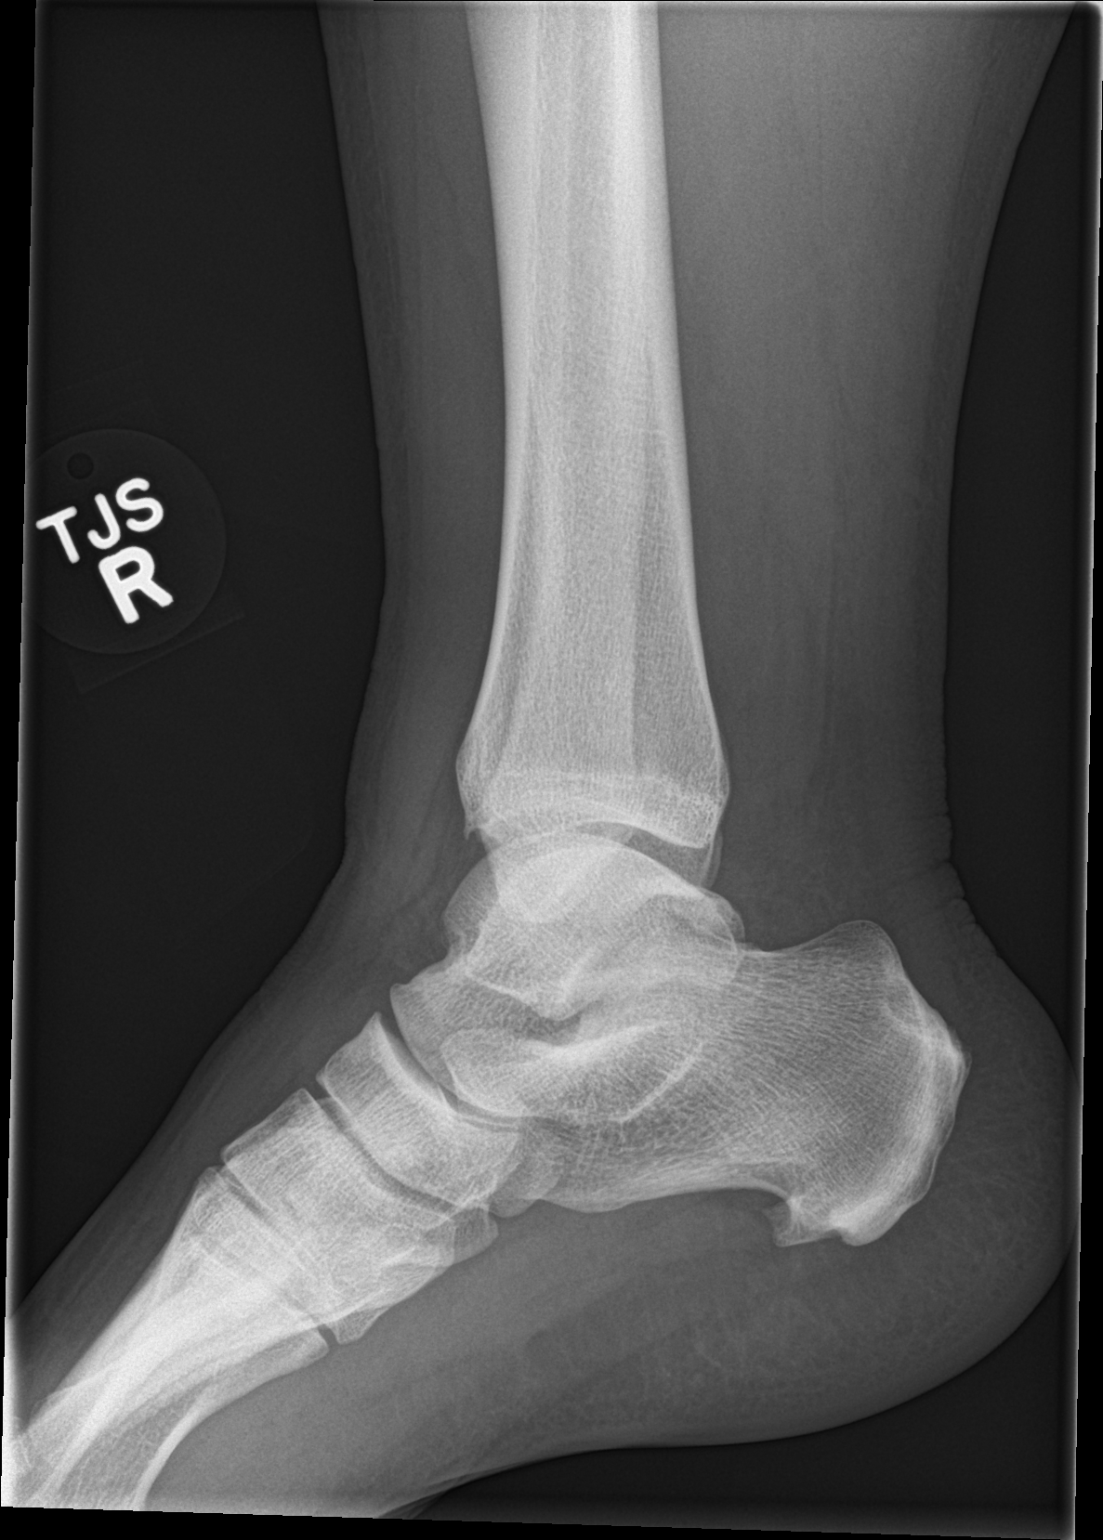

[3 of 3 positions shown; findings below may reference images not displayed]

FINDINGS: Lateral malleolar soft tissue swelling consistent with soft tissue
injury.

No discrete fracture noted. On oblique view, trabecular felt to be
responsible for lucency rather than fracture.

Plantar spur.

Small spur anterior distal talus consistent with degenerative
changes.
IMPRESSION: Soft tissue swelling lateral malleolar region consistent with soft
tissue injury without underlying fracture or dislocation.

Plantar spur.

## 2018-09-30 ENCOUNTER — Encounter: Payer: Self-pay | Admitting: Family Medicine

## 2018-09-30 ENCOUNTER — Other Ambulatory Visit: Payer: Self-pay

## 2018-09-30 ENCOUNTER — Ambulatory Visit (INDEPENDENT_AMBULATORY_CARE_PROVIDER_SITE_OTHER): Payer: BLUE CROSS/BLUE SHIELD | Admitting: Family Medicine

## 2018-09-30 ENCOUNTER — Ambulatory Visit: Payer: Self-pay

## 2018-09-30 DIAGNOSIS — Z20822 Contact with and (suspected) exposure to covid-19: Secondary | ICD-10-CM

## 2018-09-30 DIAGNOSIS — R197 Diarrhea, unspecified: Secondary | ICD-10-CM | POA: Diagnosis not present

## 2018-09-30 DIAGNOSIS — J029 Acute pharyngitis, unspecified: Secondary | ICD-10-CM

## 2018-09-30 DIAGNOSIS — R059 Cough, unspecified: Secondary | ICD-10-CM

## 2018-09-30 DIAGNOSIS — R05 Cough: Secondary | ICD-10-CM | POA: Diagnosis not present

## 2018-09-30 DIAGNOSIS — R6889 Other general symptoms and signs: Secondary | ICD-10-CM | POA: Diagnosis not present

## 2018-09-30 MED ORDER — AMOXICILLIN 500 MG PO CAPS: 500.0000 mg | ORAL_CAPSULE | Freq: Two times a day (BID) | ORAL | 0 refills | Status: DC

## 2018-09-30 NOTE — Telephone Encounter (Signed)
Incoming  Call from  Patient who  Complains of  A  Dry  Cough  Starting  to become wet with creamed colored mucous.  Onset was last week.  Complains of sore throat.  Also. States throat is burning.    Temperature is 96.1 and 96.7 Denies cardiac and lung history.  Scheduled for an appoint.       Reason for Disposition . Cough lasts > 3 weeks  Answer Assessment - Initial Assessment Questions 1. SYMPTOM: "What's the main symptom you're concerned about?" (e.g., dry mouth. chapped lips, lump)     *No Answer* 2. ONSET: "When did the  *No Answer*  start?"     Stated las week wednesday 3. PAIN: "Is there any pain?" If so, ask: "How bad is it?" (Scale: 1-10; mild, moderate, severe)    7 4. CAUSE: "What do you think is causing the symptoms?"     *No Answer* 5. OTHER SYMPTOMS: "Do you have any other symptoms?" (e.g., fever, sore throat, toothache, swelling)     Sore throat 6. PREGNANCY: "Is there any chance you are pregnant?" "When was your last menstrual period?"     A month ago  Answer Assessment - Initial Assessment Questions 1. ONSET: "When did the cough begin?"     Last week 2. SEVERITY: "How bad is the cough today?"      burning 3. RESPIRATORY DISTRESS: "Describe your breathing."      *No Answer* 4. FEVER: "Do you have a fever?" If so, ask: "What is your temperature, how was it measured, and when did it start?"     Denies  fever 5. SPUTUM: "Describe the color of your sputum" (e.g., clear, white, yellow, green), "Has there been any change recently?"    creamer color 6. HEMOPTYSIS: "Are you coughing up any blood?" If so ask: "How much, flecks, streaks, tablespoons, etc.?"     denies 7. CARDIAC HISTORY: "Do you have any history of heart disease?" (e.g., heart attack, congestive heart failure)      denies 8. LUNG HISTORY: "Do you have any history of lung disease?"  (e.g., pulmonary embolus, asthma, emphysema/COPD)     denies9. OTHER SYMPTOMS: "Do you have any other symptoms? (e.g., runny  nose, wheezing, chest pain)     Denies 10. PREGNANCY: "Is there any chance you are pregnant?" "When was your last menstrual period?"       *No Answer* 11. TRAVEL: "Have you traveled out of the country in the last month?" (e.g., travel history, exposures)       *No Answer*  Protocols used: COUGH - CHRONIC-A-AH, MOUTH SYMPTOMS-A-AH

## 2018-09-30 NOTE — Progress Notes (Signed)
Patient ID: Allison [name], female   DOB: 1985/06/21, 33 y.o.   MRN: [id number]    Virtual Visit via video Note  This visit type was conducted due to national recommendations for restrictions regarding the COVID-19 pandemic (e.g. social distancing).  This format is felt to be most appropriate for this patient at this time.  All issues noted in this document were discussed and addressed.  No physical exam was performed (except for noted visual exam findings with Video Visits).   I connected with [place]Lindsie Cardell today at  3:40 PM EDT by a video enabled telemedicine application and verified that I am speaking with the correct person using two identifiers. Location patient: home Location provider: [facility] Persons participating in the virtual visit: patient, provider  I discussed the limitations, risks, security and privacy concerns of performing an evaluation and management service by telephone and the availability of in person appointments. I also discussed with the patient that there may be a patient responsible charge related to this service. The patient expressed understanding and agreed to proceed.  HPI:  Patient and I connected via video due to complaint of sore throat, dry hacking cough & diarrhea.  Patient states the cough is been present off and on for 2 to 3 weeks.  The sore throat has begun to become worse over the past 2 to 3 days and she has had diarrhea off and on for a week.  Denies any fever or chills.  Denies body aches.  Denies shortness of breath or wheezing.  Denies nausea or vomiting.  Denies exposure to anyone under investigation for COVID-19 are known to be positive for COVID-19.   ROS: See pertinent positives and negatives per HPI.  Past Medical History:  Diagnosis Date  . Diabetes mellitus without complication [facility](HCC)     Past Surgical History:  Procedure Laterality Date  . CHOLECYSTECTOMY  2003  . TUBAL LIGATION      Family History  Problem Relation  Age of Onset  . Cancer Mother [id number]       breast cancer  . Diabetes Mother   . Multiple sclerosis Father [id number]       Progressive - not diagnosed until age 33  . Diabetes Paternal Aunt   . Diabetes Maternal Grandfather   . Diabetes Paternal Aunt     Social History   Tobacco Use  . Smoking status: Current Every Day Smoker    Packs/day: 1.00    Years: 10.00    Pack years: 10.00    Types: Cigarettes    Start date: 05/14/2006  . Smokeless tobacco: Never Used  Substance Use Topics  . Alcohol use: Yes    Alcohol/week: 0.0 standard drinks    Comment: Rare     Current Outpatient Medications:  .  albuterol (PROVENTIL HFA;VENTOLIN HFA) 108 (90 Base) MCG/ACT inhaler, Inhale 2 puffs into the lungs every 6 (six) hours as needed for wheezing or shortness of breath., Disp: 1 Inhaler, Rfl: 0 .  buPROPion (WELLBUTRIN XL) 150 MG 24 hr tablet, Take 1 tablet (150 mg total) by mouth daily., Disp: 30 tablet, Rfl: 6 .  Dulaglutide (TRULICITY) 1.5 MG/0.5ML SOPN, INJECT 1 SYRINGE SUBCUTANEOUSLY ONCE A WEEK, Disp: 12 pen, Rfl: 2 .  fluticasone (FLONASE) 50 MCG/ACT nasal spray, Place 2 sprays into both nostrils daily., Disp: 16 g, Rfl: 6 .  glucose blood test strip, Use as instructed, Disp: 100 each, Rfl: 11 .  hydrochlorothiazide (HYDRODIURIL) 25 MG tablet, Take 1 tablet (25 mg total)  by mouth daily., Disp: 90 tablet, Rfl: 3 .  Insulin Glargine (LANTUS SOLOSTAR) 100 UNIT/ML Solostar Pen, Inject 22 Units into the skin daily at 10 pm. Patient needs to schedule follow-up., Disp: 15 mL, Rfl: 1 .  Insulin Pen Needle (BD PEN NEEDLE NANO U/F) 32G X 4 MM MISC, Use daily to inject insulin., Disp: 100 each, Rfl: 11 .  Lancets (ACCU-CHEK MULTICLIX) lancets, Use as instructed, Disp: 50 each, Rfl: 0 .  lisinopril (PRINIVIL,ZESTRIL) 5 MG tablet, Take 1 tablet (5 mg total) by mouth daily., Disp: 90 tablet, Rfl: 2 .  Pseudoeph-Doxylamine-DM-APAP (NYQUIL PO), Take by mouth., Disp: , Rfl:  .  Pseudoephedrine-APAP-DM (DAYQUIL PO),  Take by mouth., Disp: , Rfl:  .  terconazole (TERAZOL 7) 0.4 % vaginal cream, Place 1 applicator vaginally at bedtime., Disp: 45 g, Rfl: 2 .  Vitamin D, Ergocalciferol, (DRISDOL) 1.25 MG (50000 UT) CAPS capsule, Take 1 capsule (50,000 Units total) by mouth 2 (two) times a week., Disp: 30 capsule, Rfl: 2 .  amoxicillin (AMOXIL) 500 MG capsule, Take 1 capsule (500 mg total) by mouth 2 (two) times daily., Disp: 20 capsule, Rfl: 0  EXAM:  GENERAL: alert, oriented, appears well and in no acute distress  HEENT: atraumatic, conjunttiva clear, no obvious abnormalities on inspection of external nose and ears  NECK: normal movements of the head and neck  LUNGS: on inspection no signs of respiratory distress, breathing rate appears normal, no obvious gross SOB, gasping or wheezing  CV: no obvious cyanosis  MS: moves all visible extremities without noticeable abnormality  PSYCH/NEURO: pleasant and cooperative, no obvious depression or anxiety, speech and thought processing grossly intact  ASSESSMENT AND PLAN:  Discussed the following assessment and plan:  Sore throat - Plan: amoxicillin (AMOXIL) 500 MG capsule, MyChart COVID-19 home monitoring program  Cough - Plan: MyChart COVID-19 home monitoring program  Suspected Covid-19 Virus Infection - Plan: MyChart COVID-19 home monitoring program, Temperature monitoring  Diarrhea, unspecified type  Due to patient's symptoms of sore throat, cough and diarrhea is possible she could have COVID-19.  We have her get set up for COVID-19 testing inpatient has been given work note to remain out of work for 2 weeks and over pending results of her COVID-19 test.  Patient has been advised to remain home on self quarantine other than going up to get the test performed and monitor symptoms for any worsening.  She is advised to call office right away if any new symptoms occur or or symptoms become worse.  Made patient aware that she can use Tylenol as needed for  aches and pains.  Advised that she can use Pepto-Bismol or Imodium as needed for diarrhea or GI symptoms and can take over-the-counter Mucinex as needed for cough. Advised to go to emergency department if any symptoms become severe for in person evaluation right away.  We will also send in a course of amoxicillin to treat sore throat that could potentially be related to a strep throat infection.   I discussed the assessment and treatment plan with the patient. The patient was provided an opportunity to ask questions and all were answered. The patient agreed with the plan and demonstrated an understanding of the instructions.   The patient was advised to call back or seek an in-person evaluation if the symptoms worsen or if the condition fails to improve as anticipated.   Tracey Harries, FNP

## 2018-09-30 NOTE — Telephone Encounter (Signed)
Allison [name]  1985/11/02  [id number]  Reason for covid test: Cough, sore throat, diarrhea  [facility] [place], [id number]

## 2018-10-01 ENCOUNTER — Telehealth: Payer: Self-pay | Admitting: Family Medicine

## 2018-10-01 ENCOUNTER — Other Ambulatory Visit: Payer: BLUE CROSS/BLUE SHIELD

## 2018-10-01 DIAGNOSIS — Z20822 Contact with and (suspected) exposure to covid-19: Secondary | ICD-10-CM

## 2018-10-01 DIAGNOSIS — J029 Acute pharyngitis, unspecified: Secondary | ICD-10-CM

## 2018-10-01 DIAGNOSIS — R059 Cough, unspecified: Secondary | ICD-10-CM

## 2018-10-01 DIAGNOSIS — R05 Cough: Secondary | ICD-10-CM

## 2018-10-01 DIAGNOSIS — R6889 Other general symptoms and signs: Secondary | ICD-10-CM | POA: Diagnosis not present

## 2018-10-01 NOTE — Telephone Encounter (Signed)
Order place for covid-19 test

## 2018-10-01 NOTE — Telephone Encounter (Signed)
Nisha from the [facility] called to request a re-entry of the COVID-19 order and appointment.  She was unsure why she was unable to open order as placed yesterday. Order and schedule resubmitted per their request

## 2018-10-01 NOTE — Telephone Encounter (Signed)
Appointment scheduled for the covid-19 test today (10-01-2018), at 9:30 at the [facility] in [place]. Pt advised to wear a mask, stay in the car with windows rolled up until ready for testing. Pt voiced understanding.

## 2018-10-03 LAB — NOVEL CORONAVIRUS, NAA: SARS-CoV-2, NAA: NOT DETECTED

## 2018-10-05 ENCOUNTER — Telehealth: Payer: Self-pay | Admitting: Family Medicine

## 2018-10-05 NOTE — Telephone Encounter (Signed)
Pt called to get her test results for the covid-19 test. She stated that her symptoms are better.  Her mother in law came to visit her and her husband briefly and she found out later that she had tested positive for the virus. She also had a coworker that works part time with her and he has tested positive for the virus. So his regular job has taken him out of work.  She stated she needs to work or she wont get paid and wanted to know if she should quarantine herself even after she tested negative. She would like a call back.

## 2018-10-06 ENCOUNTER — Telehealth: Payer: Self-pay | Admitting: Family Medicine

## 2018-10-06 NOTE — Telephone Encounter (Signed)
Patient says she still feels terrible , coughing , diarrhea , and Body aches, her employer is demending she come back to work and if her test results are negative she will not get paid for being out of work, single mother of 2 she needs not e either stating she need to still be in quarantine or that she can return to work, patient coughing during call and sounds still congested.

## 2018-10-06 NOTE — Telephone Encounter (Signed)
See result note from 10/05/18. This has already been taken care of.

## 2018-10-06 NOTE — Telephone Encounter (Signed)
Pt returned Allison [name]'s call from yesterday. She needs a back to work note. I noticed she was still coughing over the phone and patient still states she has symptoms, no fever. Pt also stated she needs to get back to work in order to get paid.

## 2018-10-07 ENCOUNTER — Encounter: Payer: Self-pay | Admitting: Family Medicine

## 2018-10-07 NOTE — Telephone Encounter (Signed)
Work note updated and sent via her my chart

## 2018-10-07 NOTE — Telephone Encounter (Signed)
Patient saw Leanora Cover for this issue. Given persistent symptoms she will likely need to remain out of work. I will forward to her to provide a work note.

## 2018-10-07 NOTE — Telephone Encounter (Signed)
Pt calling back to check status. Pt states that she needs this note as soon as possible.  Pt states that she is not getting paid because covid-19 was negative. Pt states that she needs this note so that she can go back to work. Pt did seem to have a cough. Please advise   Cb#[phone]

## 2018-10-12 ENCOUNTER — Encounter: Payer: BLUE CROSS/BLUE SHIELD | Admitting: Obstetrics and Gynecology

## 2018-10-20 ENCOUNTER — Ambulatory Visit (INDEPENDENT_AMBULATORY_CARE_PROVIDER_SITE_OTHER): Payer: BC Managed Care – PPO | Admitting: Family Medicine

## 2018-10-20 ENCOUNTER — Other Ambulatory Visit: Payer: Self-pay

## 2018-10-20 ENCOUNTER — Telehealth: Payer: Self-pay | Admitting: Family Medicine

## 2018-10-20 ENCOUNTER — Encounter: Payer: Self-pay | Admitting: Family Medicine

## 2018-10-20 DIAGNOSIS — F320 Major depressive disorder, single episode, mild: Secondary | ICD-10-CM | POA: Diagnosis not present

## 2018-10-20 DIAGNOSIS — I10 Essential (primary) hypertension: Secondary | ICD-10-CM

## 2018-10-20 DIAGNOSIS — J989 Respiratory disorder, unspecified: Secondary | ICD-10-CM | POA: Insufficient documentation

## 2018-10-20 DIAGNOSIS — F32A Anxiety disorder, unspecified: Secondary | ICD-10-CM | POA: Insufficient documentation

## 2018-10-20 DIAGNOSIS — E119 Type 2 diabetes mellitus without complications: Secondary | ICD-10-CM | POA: Diagnosis not present

## 2018-10-20 DIAGNOSIS — Z794 Long term (current) use of insulin: Secondary | ICD-10-CM

## 2018-10-20 DIAGNOSIS — F419 Anxiety disorder, unspecified: Secondary | ICD-10-CM | POA: Insufficient documentation

## 2018-10-20 MED ORDER — ESCITALOPRAM OXALATE 10 MG PO TABS: 10.0000 mg | ORAL_TABLET | Freq: Every day | ORAL | 1 refills | Status: DC

## 2018-10-20 MED ORDER — LISINOPRIL 5 MG PO TABS: 5.0000 mg | ORAL_TABLET | Freq: Every day | ORAL | 2 refills | Status: DC

## 2018-10-20 NOTE — Progress Notes (Signed)
Virtual Visit via video note  This visit type was conducted due to national recommendations for restrictions regarding the COVID-19 pandemic (e.g. social distancing).  This format is felt to be most appropriate for this patient at this time.  All issues noted in this document were discussed and addressed.  No physical exam was performed (except for noted visual exam findings with Video Visits).   I connected with Allison [name] today at  4:00 PM EDT by a video enabled telemedicine application and verified that I am speaking with the correct person using two identifiers. Location patient: home Location provider: work  Persons participating in the virtual visit: patient, provider  I discussed the limitations, risks, security and privacy concerns of performing an evaluation and management service by telephone and the availability of in person appointments. I also discussed with the patient that there may be a patient responsible charge related to this service. The patient expressed understanding and agreed to proceed.   Reason for visit: follow-up  HPI: Hypertension: Not checking blood pressures at home though has been quite elevated when she has had it checked at other doctor's offices.  She is currently on HCTZ.  She has been without her lisinopril for the past 2 months.  No chest pain, shortness of breath, or edema.  She notes blood pressure issues for the past 6 or 7 years.  Diabetes: Typically ranging 140-200.  Taking Trulicity and insulin glargine 24 units daily.  No polyuria or polydipsia.  No hypoglycemia.  No recent ophthalmology visit.  Depression: Patient has been on Wellbutrin from her gynecologist though notes this is not terribly beneficial.  She notes she feels down related to her job.  She has nervousness and anxiety surrounding her job as well when she goes to work.  No SI.  Respiratory illness: Patient previously had a respiratory illness with cough and sore throat.  She  tested negative for COVID and did quarantine for 14 days.  She feels better now.  Cough is improved.   ROS: See pertinent positives and negatives per HPI.  Past Medical History:  Diagnosis Date  . Diabetes mellitus without complication [facility])     Past Surgical History:  Procedure Laterality Date  . CHOLECYSTECTOMY  2003  . TUBAL LIGATION      Family History  Problem Relation Age of Onset  . Cancer Mother 49       breast cancer  . Diabetes Mother   . Multiple sclerosis Father 30       Progressive - not diagnosed until age 70  . Diabetes Paternal Aunt   . Diabetes Maternal Grandfather   . Diabetes Paternal Aunt     SOCIAL HX: Smoker.   Current Outpatient Medications:  .  buPROPion (WELLBUTRIN XL) 150 MG 24 hr tablet, Take 1 tablet (150 mg total) by mouth daily., Disp: 30 tablet, Rfl: 6 .  Dulaglutide (TRULICITY) 1.5 SW/5.4OE SOPN, INJECT 1 SYRINGE SUBCUTANEOUSLY ONCE A WEEK, Disp: 12 pen, Rfl: 2 .  glucose blood test strip, Use as instructed, Disp: 100 each, Rfl: 11 .  hydrochlorothiazide (HYDRODIURIL) 25 MG tablet, Take 1 tablet (25 mg total) by mouth daily., Disp: 90 tablet, Rfl: 3 .  Insulin Glargine (LANTUS SOLOSTAR) 100 UNIT/ML Solostar Pen, Inject 22 Units into the skin daily at 10 pm. Patient needs to schedule follow-up., Disp: 15 mL, Rfl: 1 .  Insulin Pen Needle (BD PEN NEEDLE NANO U/F) 32G X 4 MM MISC, Use daily to inject insulin., Disp: 100 each, Rfl:  11 .  Lancets (ACCU-CHEK MULTICLIX) lancets, Use as instructed, Disp: 50 each, Rfl: 0 .  Vitamin D, Ergocalciferol, (DRISDOL) 1.25 MG (50000 UT) CAPS capsule, Take 1 capsule (50,000 Units total) by mouth 2 (two) times a week., Disp: 30 capsule, Rfl: 2 .  albuterol (PROVENTIL HFA;VENTOLIN HFA) 108 (90 Base) MCG/ACT inhaler, Inhale 2 puffs into the lungs every 6 (six) hours as needed for wheezing or shortness of breath. (Patient not taking: Reported on 10/20/2018), Disp: 1 Inhaler, Rfl: 0 .  escitalopram (LEXAPRO) 10 MG  tablet, Take 1 tablet (10 mg total) by mouth daily., Disp: 90 tablet, Rfl: 1 .  fluticasone (FLONASE) 50 MCG/ACT nasal spray, Place 2 sprays into both nostrils daily. (Patient not taking: Reported on 10/20/2018), Disp: 16 g, Rfl: 6 .  lisinopril (ZESTRIL) 5 MG tablet, Take 1 tablet (5 mg total) by mouth daily., Disp: 90 tablet, Rfl: 2 .  Pseudoeph-Doxylamine-DM-APAP (NYQUIL PO), Take by mouth., Disp: , Rfl:  .  Pseudoephedrine-APAP-DM (DAYQUIL PO), Take by mouth., Disp: , Rfl:  .  terconazole (TERAZOL 7) 0.4 % vaginal cream, Place 1 applicator vaginally at bedtime. (Patient not taking: Reported on 10/20/2018), Disp: 45 g, Rfl: 2  EXAM:  VITALS per patient if applicable: None.  GENERAL: alert, oriented, appears well and in no acute distress  HEENT: atraumatic, conjunttiva clear, no obvious abnormalities on inspection of external nose and ears  NECK: normal movements of the head and neck  LUNGS: on inspection no signs of respiratory distress, breathing rate appears normal, no obvious gross SOB, gasping or wheezing  CV: no obvious cyanosis  MS: moves all visible extremities without noticeable abnormality  PSYCH/NEURO: pleasant and cooperative, no obvious depression or anxiety, speech and thought processing grossly intact  ASSESSMENT AND PLAN:  Discussed the following assessment and plan:  Benign essential HTN - Plan: US Renal Artery Stenosis, lisinopril (ZESTRIL) 5 MG tablet  Type 2 diabetes mellitus without complication, with long-term current use of insulin (HCC)  Respiratory illness  Depression, major, single episode, mild (HCC)  Benign essential HTN Uncontrolled.  We will get her back on lisinopril.  She reports elevations of her systolic blood pressures up to 180 at times when off of medication though does report when she was taking her medication previously she was typically in the 130s.  Given her relatively young age and elevation of blood pressure I do think we should obtain  a renal duplex to evaluate for renal artery stenosis.  This will be ordered.  She will continue HCTZ.  She will have lab work done in 1 week and we will fax the labs to [facility] wellness center [facility] for her to have them completed there.  Type 2 diabetes mellitus without complication, with long-term current use of insulin (HCC) Seems to be uncontrolled.  We will check an A1c and then determine changes to medication regimen at that time.  Respiratory illness Improved.  She will monitor for recurrent symptoms.  Depression, major, single episode, mild (HCC) We will start the patient on Lexapro.  She will monitor her symptoms.  We will see her back in 2 months.  Discussed potential side effects of Lexapro with possibility of sexual dysfunction, weight gain, drowsiness, and sleep issues.  Front office staff will contact the patient and get her scheduled for follow-up for 2 months.  Social distancing precautions and sick precautions given regarding COVID-19.   I discussed the assessment and treatment plan with the patient. The patient was provided an opportunity to ask  questions and all were answered. The patient agreed with the plan and demonstrated an understanding of the instructions.   The patient was advised to call back or seek an in-person evaluation if the symptoms worsen or if the condition fails to improve as anticipated.   Tommi Rumps, MD

## 2018-10-20 NOTE — Telephone Encounter (Signed)
Please fax the prescription for lab orders to [phone].  This is to the Wellness center.

## 2018-10-20 NOTE — Assessment & Plan Note (Signed)
Uncontrolled.  We will get her back on lisinopril.  She reports elevations of her systolic blood pressures up to 180 at times when off of medication though does report when she was taking her medication previously she was typically in the 130s.  Given her relatively young age and elevation of blood pressure I do think we should obtain a renal duplex to evaluate for renal artery stenosis.  This will be ordered.  She will continue HCTZ.  She will have lab work done in 1 week and we will fax the labs to the wellness center at [facility] for her to have them completed there.

## 2018-10-20 NOTE — Assessment & Plan Note (Signed)
Improved.  She will monitor for recurrent symptoms.

## 2018-10-20 NOTE — Assessment & Plan Note (Signed)
Seems to be uncontrolled.  We will check an A1c and then determine changes to medication regimen at that time.

## 2018-10-20 NOTE — Assessment & Plan Note (Signed)
We will start the patient on Lexapro.  She will monitor her symptoms.  We will see her back in 2 months.  Discussed potential side effects of Lexapro with possibility of sexual dysfunction, weight gain, drowsiness, and sleep issues.

## 2018-10-27 NOTE — Telephone Encounter (Signed)
Please see my prior message. I have placed the order to be faxed to [facility] in the sign folder. Please contact the patient and see if she can have labs drawn on Thursday or Friday. Thanks.

## 2018-10-29 ENCOUNTER — Telehealth: Payer: Self-pay

## 2018-10-29 NOTE — Telephone Encounter (Signed)
Called and informed pt that the lab Rx was faxed to the wellness center, pt understood.  Allison [name],cma

## 2018-10-29 NOTE — Telephone Encounter (Signed)
A Lab Order was faxed to [facility] per pt request.  Fax was confirmed.  Cainen Burnham,cma

## 2018-11-09 ENCOUNTER — Other Ambulatory Visit: Payer: Self-pay

## 2018-11-09 ENCOUNTER — Ambulatory Visit: Payer: BC Managed Care – PPO

## 2018-11-10 ENCOUNTER — Other Ambulatory Visit: Payer: Medicaid Other

## 2018-11-10 DIAGNOSIS — I10 Essential (primary) hypertension: Secondary | ICD-10-CM

## 2018-11-10 DIAGNOSIS — E119 Type 2 diabetes mellitus without complications: Secondary | ICD-10-CM

## 2018-11-10 DIAGNOSIS — Z8639 Personal history of other endocrine, nutritional and metabolic disease: Secondary | ICD-10-CM

## 2018-11-11 ENCOUNTER — Other Ambulatory Visit: Payer: Medicaid Other

## 2018-11-11 LAB — COMPREHENSIVE METABOLIC PANEL
ALT: 24 IU/L (ref 0–32)
AST: 12 IU/L (ref 0–40)
Albumin/Globulin Ratio: 1.8 (ref 1.2–2.2)
Albumin: 4.6 g/dL (ref 3.8–4.8)
Alkaline Phosphatase: 75 IU/L (ref 39–117)
BUN/Creatinine Ratio: 15 (ref 9–23)
BUN: 11 mg/dL (ref 6–20)
Bilirubin Total: <0.2 mg/dL (ref 0.0–1.2)
CO2: 18 mmol/L — ABNORMAL LOW (ref 20–29)
Calcium: 9.6 mg/dL (ref 8.7–10.2)
Chloride: 101 mmol/L (ref 96–106)
Creatinine, Ser: 0.71 mg/dL (ref 0.57–1.00)
GFR calc Af Amer: 129 mL/min/{1.73_m2} (ref >59)
GFR calc non Af Amer: 112 mL/min/{1.73_m2} (ref >59)
Globulin, Total: 2.5 g/dL (ref 1.5–4.5)
Glucose: 151 mg/dL — ABNORMAL HIGH (ref 65–99)
Potassium: 4 mmol/L (ref 3.5–5.2)
Sodium: 136 mmol/L (ref 134–144)
Total Protein: 7.1 g/dL (ref 6.0–8.5)

## 2018-11-11 LAB — HEMOGLOBIN A1C
Est. average glucose Bld gHb Est-mCnc: 220 mg/dL
Hgb A1c MFr Bld: 9.3 % — ABNORMAL HIGH (ref 4.8–5.6)

## 2018-11-11 LAB — VITAMIN D 25 HYDROXY (VIT D DEFICIENCY, FRACTURES): Vit D, 25-Hydroxy: 20.2 ng/mL — ABNORMAL LOW (ref 30.0–100.0)

## 2018-11-17 ENCOUNTER — Encounter: Payer: Self-pay | Admitting: Obstetrics and Gynecology

## 2018-11-17 ENCOUNTER — Ambulatory Visit (INDEPENDENT_AMBULATORY_CARE_PROVIDER_SITE_OTHER): Payer: BC Managed Care – PPO | Admitting: Obstetrics and Gynecology

## 2018-11-17 ENCOUNTER — Other Ambulatory Visit: Payer: Self-pay

## 2018-11-17 VITALS — BP 147/99 | HR 88 | Ht 61.0 in | Wt 208.9 lb

## 2018-11-17 DIAGNOSIS — F320 Major depressive disorder, single episode, mild: Secondary | ICD-10-CM | POA: Diagnosis not present

## 2018-11-17 DIAGNOSIS — Z79899 Other long term (current) drug therapy: Secondary | ICD-10-CM

## 2018-11-17 NOTE — Progress Notes (Signed)
  Subjective:     Patient ID: Allison [name], female   DOB: [birth year], 33 y.o.   MRN: [id number]  HPI Here for antidepressant  Medication management. States PCP changed her to lexapro as he felt wellbutrin could be elevating BP. She has only been taking it for a week and feels fine. 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 Feels like she is handling it OK for now.   Review of Systems  Constitutional: Positive for fatigue.  All other systems reviewed and are negative.      Objective:   Physical Exam A&Ox4 Well groomed female in no distress Blood pressure (!) 147/99, pulse 88, height 5\' 1"  (1.549 m), weight 208 lb 14.4 oz (94.8 kg), last menstrual period 10/15/2018. PE not indicated.    Assessment:     Depression follow up.    Plan:     Will continue with lexapro for now. Will consider note for Employer for c/w work hours and not doing swing shifts. She will let me know if she desires note in the future. RTC when pap due or as needed.   Latrisha Coiro,CNM

## 2019-01-03 ENCOUNTER — Other Ambulatory Visit: Payer: Self-pay | Admitting: Family Medicine

## 2019-01-03 DIAGNOSIS — E119 Type 2 diabetes mellitus without complications: Secondary | ICD-10-CM

## 2019-01-03 DIAGNOSIS — Z794 Long term (current) use of insulin: Secondary | ICD-10-CM

## 2019-01-03 MED ORDER — LANTUS SOLOSTAR 100 UNIT/ML ~~LOC~~ SOPN: 22.0000 [IU] | PEN_INJECTOR | Freq: Every day | SUBCUTANEOUS | 1 refills | Status: DC

## 2019-01-03 NOTE — Telephone Encounter (Signed)
Copied from [place]. Topic: Quick Communication - Rx Refill/Question >> Jan 03, 2019  2:06 PM Leward Quan A wrote: Medication: Insulin Glargine (LANTUS SOLOSTAR) 100 UNIT/ML Solostar Pen   Patient is completely out of this medication  Has the patient contacted their pharmacy? Yes.   (Agent: If no, request that the patient contact the pharmacy for the refill.) (Agent: If yes, when and what did the pharmacy advise?)  Preferred Pharmacy (with phone number or street name): [facility] [address], [place] - [place] [phone] (Phone) [phone] (Fax)    Agent: Please be advised that RX refills may take up to 3 business days. We ask that you follow-up with your pharmacy.

## 2019-02-16 ENCOUNTER — Ambulatory Visit: Payer: Medicaid Other

## 2019-02-16 ENCOUNTER — Other Ambulatory Visit: Payer: Self-pay

## 2019-02-16 DIAGNOSIS — Z23 Encounter for immunization: Secondary | ICD-10-CM

## 2019-06-28 LAB — HM DIABETES EYE EXAM

## 2019-07-12 ENCOUNTER — Other Ambulatory Visit: Payer: Self-pay | Admitting: Family Medicine

## 2019-07-12 DIAGNOSIS — Z794 Long term (current) use of insulin: Secondary | ICD-10-CM

## 2019-07-12 DIAGNOSIS — E119 Type 2 diabetes mellitus without complications: Secondary | ICD-10-CM

## 2019-07-13 ENCOUNTER — Telehealth: Payer: Self-pay

## 2019-07-13 NOTE — Telephone Encounter (Signed)
mychart message sent to patient- request from [facility] for Trulicity denied- she needs to followup with her PCP as we do not manage diabetes in non pregnant patients. Was filled by Harlow Mares who is no longer at this practice.

## 2019-08-05 ENCOUNTER — Other Ambulatory Visit: Payer: Self-pay

## 2019-08-05 ENCOUNTER — Telehealth: Payer: Self-pay | Admitting: Family Medicine

## 2019-08-05 DIAGNOSIS — Z794 Long term (current) use of insulin: Secondary | ICD-10-CM

## 2019-08-05 DIAGNOSIS — E119 Type 2 diabetes mellitus without complications: Secondary | ICD-10-CM

## 2019-08-05 MED ORDER — TRULICITY 1.5 MG/0.5ML ~~LOC~~ SOAJ: SUBCUTANEOUS | 2 refills | Status: DC

## 2019-08-05 NOTE — Telephone Encounter (Signed)
Pt needs immediate refill on Dulaglutide (TRULICITY) 1.5 MG/0.5ML SOPN and LANTUS SOLOSTAR 100 UNIT/ML Solostar Pen. Pt is out of medication. Pt has appt scheduled next week.

## 2019-08-05 NOTE — Telephone Encounter (Signed)
I called the patient and informed her that the latus was sent on 07/12/2019 I sent the trulicity to [facility].  Forrest Demuro,cma

## 2019-08-10 ENCOUNTER — Telehealth (INDEPENDENT_AMBULATORY_CARE_PROVIDER_SITE_OTHER): Payer: BC Managed Care – PPO | Admitting: Family Medicine

## 2019-08-10 ENCOUNTER — Other Ambulatory Visit: Payer: Self-pay

## 2019-08-10 ENCOUNTER — Encounter: Payer: Self-pay | Admitting: Family Medicine

## 2019-08-10 DIAGNOSIS — F17210 Nicotine dependence, cigarettes, uncomplicated: Secondary | ICD-10-CM

## 2019-08-10 DIAGNOSIS — I10 Essential (primary) hypertension: Secondary | ICD-10-CM

## 2019-08-10 DIAGNOSIS — E119 Type 2 diabetes mellitus without complications: Secondary | ICD-10-CM | POA: Diagnosis not present

## 2019-08-10 DIAGNOSIS — Z794 Long term (current) use of insulin: Secondary | ICD-10-CM

## 2019-08-10 MED ORDER — LANTUS SOLOSTAR 100 UNIT/ML ~~LOC~~ SOPN: 30.0000 [IU] | PEN_INJECTOR | Freq: Every day | SUBCUTANEOUS | 1 refills | Status: DC

## 2019-08-10 NOTE — Assessment & Plan Note (Signed)
Decent control.  We will check an A1c with her labs.  She will continue her current regimen.

## 2019-08-10 NOTE — Assessment & Plan Note (Signed)
Borderline BPs.  We will have her into the office to check her BP with the nurse visit.  Lab work to be done at her job.  Prescription for labs will be available for her to pick up.

## 2019-08-10 NOTE — Assessment & Plan Note (Signed)
Smoking cessation counseling was provided.  Approximately 3 minutes were spent discussing the rationale for tobacco cessation and strategies for doing so.  Adjuncts, including nicotine patches, nicotine lozenges, varenicline and buproprion were recommended.  Patient is not ready to quit.  I encouraged her to quit.  We will follow-up in 4 months on this.

## 2019-08-10 NOTE — Progress Notes (Signed)
Virtual Visit via video Note  This visit type was conducted due to national recommendations for restrictions regarding the COVID-19 pandemic (e.g. social distancing).  This format is felt to be most appropriate for this patient at this time.  All issues noted in this document were discussed and addressed.  No physical exam was performed (except for noted visual exam findings with Video Visits).   I connected with Allison [name] today at  2:45 PM EDT by a video enabled telemedicine application and verified that I am speaking with the correct person using two identifiers. Location patient: home Location provider: work Persons participating in the virtual visit: patient, provider  I discussed the limitations, risks, security and privacy concerns of performing an evaluation and management service by telephone and the availability of in person appointments. I also discussed with the patient that there may be a patient responsible charge related to this service. The patient expressed understanding and agreed to proceed.  Reason for visit: f/u  HPI: Hypertension: Has had it checked at the dentist office recently and notes it was 210/108 to start with though after she calmed down was 108/91.  Taking hydrochlorothiazide.  No chest pain, shortness of breath, or edema.  Diabetes: Typically 130-140s.  Taking Trulicity.  Also on Lantus 30 units once daily.  Rarely has excursions to the 190 range.  No polyuria or polydipsia.  No hypoglycemia.  Tobacco abuse: Continues to smoke cigarettes.  Smoking half a pack per day.  She is not ready to quit.   ROS: See pertinent positives and negatives per HPI.  Past Medical History:  Diagnosis Date  . Anxiety   . Depression   . Diabetes mellitus without complication (HCC)   . Hypertension     Past Surgical History:  Procedure Laterality Date  . CHOLECYSTECTOMY  2003  . TUBAL LIGATION      Family History  Problem Relation Age of Onset  . Cancer Mother  91       breast cancer  . Diabetes Mother   . Multiple sclerosis Father 84       Progressive - not diagnosed until age 56  . Diabetes Paternal Aunt   . Diabetes Maternal Grandfather   . Diabetes Paternal Aunt     SOCIAL HX: Smoker   Current Outpatient Medications:  .  Dulaglutide (TRULICITY) 1.5 MG/0.5ML SOPN, INJECT 1 SYRINGE SUBCUTANEOUSLY ONCE A WEEK, Disp: 12 pen, Rfl: 2 .  escitalopram (LEXAPRO) 10 MG tablet, Take 1 tablet (10 mg total) by mouth daily., Disp: 90 tablet, Rfl: 1 .  glucose blood test strip, Use as instructed, Disp: 100 each, Rfl: 11 .  hydrochlorothiazide (HYDRODIURIL) 25 MG tablet, Take 1 tablet (25 mg total) by mouth daily., Disp: 90 tablet, Rfl: 3 .  insulin glargine (LANTUS SOLOSTAR) 100 UNIT/ML Solostar Pen, Inject 30 Units into the skin daily., Disp: 25 mL, Rfl: 1 .  Vitamin D, Ergocalciferol, (DRISDOL) 1.25 MG (50000 UT) CAPS capsule, Take 1 capsule (50,000 Units total) by mouth 2 (two) times a week., Disp: 30 capsule, Rfl: 2  EXAM:  VITALS per patient if applicable:  GENERAL: alert, oriented, appears well and in no acute distress  HEENT: atraumatic, conjunttiva clear, no obvious abnormalities on inspection of external nose and ears  NECK: normal movements of the head and neck  LUNGS: on inspection no signs of respiratory distress, breathing rate appears normal, no obvious gross SOB, gasping or wheezing  CV: no obvious cyanosis  MS: moves all visible extremities without noticeable  abnormality   ASSESSMENT AND PLAN:  Discussed the following assessment and plan:  Benign essential HTN Borderline BPs.  We will have her into the office to check her BP with the nurse visit.  Lab work to be done at her job.  Prescription for labs will be available for her to pick up.  Type 2 diabetes mellitus without complication, with long-term current use of insulin ([place]) Decent control.  We will check an A1c with her labs.  She will continue her current  regimen.  Nicotine dependence, cigarettes, uncomplicated Smoking cessation counseling was provided.  Approximately 3 minutes were spent discussing the rationale for tobacco cessation and strategies for doing so.  Adjuncts, including nicotine patches, nicotine lozenges, varenicline and buproprion were recommended.  Patient is not ready to quit.  I encouraged her to quit.  We will follow-up in 4 months on this.    No orders of the defined types were placed in this encounter.   Meds ordered this encounter  Medications  . insulin glargine (LANTUS SOLOSTAR) 100 UNIT/ML Solostar Pen    Sig: Inject 30 Units into the skin daily.    Dispense:  25 mL    Refill:  1     I discussed the assessment and treatment plan with the patient. The patient was provided an opportunity to ask questions and all were answered. The patient agreed with the plan and demonstrated an understanding of the instructions.   The patient was advised to call back or seek an in-person evaluation if the symptoms worsen or if the condition fails to improve as anticipated.   Patient reports she has [employer].  She will get [place] her new insurance card.  Tommi Rumps, MD

## 2019-08-24 ENCOUNTER — Telehealth: Payer: Self-pay | Admitting: Family Medicine

## 2019-08-24 NOTE — Telephone Encounter (Signed)
I called the patient and informed her that I spoke with the provider about her symptoms and she should go to urgent care, patient understood.  Allison [name],cma

## 2019-08-24 NOTE — Telephone Encounter (Signed)
Pt received J&J covid vaccine on 08/19/19. She has had nausea and body aches since then. Please call back to advise.

## 2019-08-30 ENCOUNTER — Telehealth: Payer: Self-pay | Admitting: Family Medicine

## 2019-08-30 NOTE — Telephone Encounter (Signed)
Pt states that PCP wanted her to get blood work and that she usually gets it done at her work. Please call pt when orders are placed up front for pick up

## 2019-08-30 NOTE — Telephone Encounter (Signed)
Pt states that PCP wanted her to get blood work and that she usually gets it done at her work. Please call pt when orders are placed up front for pick up.  Stacee Earp,cma

## 2019-08-31 NOTE — Telephone Encounter (Signed)
Prescription completed. Please make this available for pick up.

## 2019-08-31 NOTE — Telephone Encounter (Signed)
I called and informed the patient that her RX for labs was ready to be picked up at the front, she understood.  Allison [name],cma

## 2019-09-07 ENCOUNTER — Ambulatory Visit: Payer: BC Managed Care – PPO

## 2019-09-27 ENCOUNTER — Other Ambulatory Visit: Payer: Self-pay | Admitting: Family Medicine

## 2019-09-27 DIAGNOSIS — E119 Type 2 diabetes mellitus without complications: Secondary | ICD-10-CM

## 2019-09-27 DIAGNOSIS — Z794 Long term (current) use of insulin: Secondary | ICD-10-CM

## 2019-12-21 ENCOUNTER — Telehealth: Payer: Self-pay

## 2019-12-21 ENCOUNTER — Encounter: Payer: Self-pay | Admitting: Family Medicine

## 2019-12-21 ENCOUNTER — Other Ambulatory Visit: Payer: Self-pay

## 2019-12-21 ENCOUNTER — Telehealth (INDEPENDENT_AMBULATORY_CARE_PROVIDER_SITE_OTHER): Payer: BC Managed Care – PPO | Admitting: Family Medicine

## 2019-12-21 DIAGNOSIS — N946 Dysmenorrhea, unspecified: Secondary | ICD-10-CM | POA: Diagnosis not present

## 2019-12-21 DIAGNOSIS — E119 Type 2 diabetes mellitus without complications: Secondary | ICD-10-CM | POA: Diagnosis not present

## 2019-12-21 DIAGNOSIS — Z794 Long term (current) use of insulin: Secondary | ICD-10-CM

## 2019-12-21 DIAGNOSIS — I10 Essential (primary) hypertension: Secondary | ICD-10-CM

## 2019-12-21 MED ORDER — AMLODIPINE BESYLATE 5 MG PO TABS
5.0000 mg | ORAL_TABLET | Freq: Every day | ORAL | 3 refills | Status: DC
Start: 2019-12-21 — End: 2020-04-18

## 2019-12-21 MED ORDER — LANTUS SOLOSTAR 100 UNIT/ML ~~LOC~~ SOPN: 40.0000 [IU] | PEN_INJECTOR | Freq: Every day | SUBCUTANEOUS | 0 refills | Status: DC

## 2019-12-21 NOTE — Progress Notes (Signed)
Virtual Visit via video Note  This visit type was conducted due to national recommendations for restrictions regarding the COVID-19 pandemic (e.g. social distancing).  This format is felt to be most appropriate for this patient at this time.  All issues noted in this document were discussed and addressed.  No physical exam was performed (except for noted visual exam findings with Video Visits).   I connected with Allison [name] okay today at  3:30 PM EDT by a video enabled telemedicine application and verified that I am speaking with the correct person using two identifiers. Location patient: car Location provider: work Persons participating in the virtual visit: patient, provider  I discussed the limitations, risks, security and privacy concerns of performing an evaluation and management service by telephone and the availability of in person appointments. I also discussed with the patient that there may be a patient responsible charge related to this service. The patient expressed understanding and agreed to proceed.  Reason for visit: f/u.  HPI: DIABETES Disease Monitoring: Blood Sugar ranges-typically 150s, though occasionally lower Polyuria/phagia/dipsia- no      Optho- UTD Medications: Compliance- taking trulicity  Hypoglycemic symptoms-no  HYPERTENSION  Disease Monitoring  Home BP Monitoring 118/90s Chest pain- no    Dyspnea- no Medications  Compliance-  Taking HCTZ.  Edema- no  Dysmenorrhea: Patient notes bad cramps with her menstrual cycles.  She is discussed this with GYN previously though her midwife left the practice.  She has regular cycles once monthly lasting 7 to 10 days.    ROS: See pertinent positives and negatives per HPI.  Past Medical History:  Diagnosis Date  . Anxiety   . Depression   . Diabetes mellitus without complication (HCC)   . Hypertension     Past Surgical History:  Procedure Laterality Date  . CHOLECYSTECTOMY  2003  . TUBAL LIGATION       Family History  Problem Relation Age of Onset  . Cancer Mother 57       breast cancer  . Diabetes Mother   . Multiple sclerosis Father 44       Progressive - not diagnosed until age 58  . Diabetes Paternal Aunt   . Diabetes Maternal Grandfather   . Diabetes Paternal Aunt     SOCIAL HX: smoker   Current Outpatient Medications:  .  Dulaglutide (TRULICITY) 1.5 MG/0.5ML SOPN, INJECT 1 SYRINGE SUBCUTANEOUSLY ONCE A WEEK, Disp: 12 pen, Rfl: 2 .  escitalopram (LEXAPRO) 10 MG tablet, Take 1 tablet (10 mg total) by mouth daily., Disp: 90 tablet, Rfl: 1 .  glucose blood test strip, Use as instructed, Disp: 100 each, Rfl: 11 .  hydrochlorothiazide (HYDRODIURIL) 25 MG tablet, Take 1 tablet (25 mg total) by mouth daily., Disp: 90 tablet, Rfl: 3 .  insulin glargine (LANTUS SOLOSTAR) 100 UNIT/ML Solostar Pen, Inject 40 Units into the skin daily., Disp: 15 mL, Rfl: 0 .  Vitamin D, Ergocalciferol, (DRISDOL) 1.25 MG (50000 UT) CAPS capsule, Take 1 capsule (50,000 Units total) by mouth 2 (two) times a week., Disp: 30 capsule, Rfl: 2 .  amLODipine (NORVASC) 5 MG tablet, Take 1 tablet (5 mg total) by mouth daily., Disp: 90 tablet, Rfl: 3  EXAM:  VITALS per patient if applicable:  GENERAL: alert, oriented, appears well and in no acute distress  HEENT: atraumatic, conjunttiva clear, no obvious abnormalities on inspection of external nose and ears  NECK: normal movements of the head and neck  LUNGS: on inspection no signs of respiratory distress, breathing  rate appears normal, no obvious gross SOB, gasping or wheezing  CV: no obvious cyanosis  MS: moves all visible extremities without noticeable abnormality  PSYCH/NEURO: pleasant and cooperative, no obvious depression or anxiety, speech and thought processing grossly intact  ASSESSMENT AND PLAN:  Discussed the following assessment and plan:  Benign essential HTN Uncontrolled.  We will start her on amlodipine.  She will continue HCTZ.   She will monitor for swelling or constipation with amlodipine.  She will follow up in 1 month in the office with me.  Handwritten prescription for lab work as well with A1c, lipid panel, CMP, and urine microalbumin creatinine ratio.  She will pick this up and have these done at her work.  Type 2 diabetes mellitus without complication, with long-term current use of insulin (HCC) Continue current regimen.  Check labs as outlined above.  Dysmenorrhea Refer back to GYN.   Orders Placed This Encounter  Procedures  . Ambulatory referral to Gynecology    Referral Priority:   Routine    Referral Type:   Consultation    Referral Reason:   Specialty Services Required    Requested Specialty:   Gynecology    Number of Visits Requested:   1    Meds ordered this encounter  Medications  . insulin glargine (LANTUS SOLOSTAR) 100 UNIT/ML Solostar Pen    Sig: Inject 40 Units into the skin daily.    Dispense:  15 mL    Refill:  0  . amLODipine (NORVASC) 5 MG tablet    Sig: Take 1 tablet (5 mg total) by mouth daily.    Dispense:  90 tablet    Refill:  3     I discussed the assessment and treatment plan with the patient. The patient was provided an opportunity to ask questions and all were answered. The patient agreed with the plan and demonstrated an understanding of the instructions.   The patient was advised to call back or seek an in-person evaluation if the symptoms worsen or if the condition fails to improve as anticipated.   Marikay Alar, MD

## 2019-12-21 NOTE — Telephone Encounter (Signed)
I called and informed the patient that her prescription for labs is ready for pickup at the front desk and she understood.  Kaelob Persky,cma

## 2019-12-21 NOTE — Assessment & Plan Note (Signed)
Uncontrolled.  We will start her on amlodipine.  She will continue HCTZ.  She will monitor for swelling or constipation with amlodipine.  She will follow up in 1 month in the office with me.  Handwritten prescription for lab work as well with A1c, lipid panel, CMP, and urine microalbumin creatinine ratio.  She will pick this up and have these done at her work.

## 2019-12-21 NOTE — Assessment & Plan Note (Signed)
Refer back to GYN.

## 2019-12-21 NOTE — Assessment & Plan Note (Signed)
Continue current regimen.  Check labs as outlined above.

## 2019-12-27 ENCOUNTER — Other Ambulatory Visit: Payer: Medicaid Other

## 2019-12-28 ENCOUNTER — Other Ambulatory Visit: Payer: Self-pay

## 2019-12-28 ENCOUNTER — Other Ambulatory Visit: Payer: Medicaid Other

## 2019-12-28 DIAGNOSIS — E119 Type 2 diabetes mellitus without complications: Secondary | ICD-10-CM

## 2019-12-28 DIAGNOSIS — I10 Essential (primary) hypertension: Secondary | ICD-10-CM

## 2019-12-29 LAB — COMPREHENSIVE METABOLIC PANEL
ALT: 10 IU/L (ref 0–32)
AST: 6 IU/L (ref 0–40)
Albumin/Globulin Ratio: 1.6 (ref 1.2–2.2)
Albumin: 4.2 g/dL (ref 3.8–4.8)
Alkaline Phosphatase: 80 IU/L (ref 48–121)
BUN/Creatinine Ratio: 21 (ref 9–23)
BUN: 13 mg/dL (ref 6–20)
Bilirubin Total: <0.2 mg/dL (ref 0.0–1.2)
CO2: 20 mmol/L (ref 20–29)
Calcium: 9.1 mg/dL (ref 8.7–10.2)
Chloride: 100 mmol/L (ref 96–106)
Creatinine, Ser: 0.62 mg/dL (ref 0.57–1.00)
GFR calc Af Amer: 136 mL/min/{1.73_m2} (ref >59)
GFR calc non Af Amer: 118 mL/min/{1.73_m2} (ref >59)
Globulin, Total: 2.6 g/dL (ref 1.5–4.5)
Glucose: 255 mg/dL — ABNORMAL HIGH (ref 65–99)
Potassium: 4.3 mmol/L (ref 3.5–5.2)
Sodium: 135 mmol/L (ref 134–144)
Total Protein: 6.8 g/dL (ref 6.0–8.5)

## 2019-12-29 LAB — LIPID PANEL
Chol/HDL Ratio: 8.5 ratio — ABNORMAL HIGH (ref 0.0–4.4)
Cholesterol, Total: 196 mg/dL (ref 100–199)
HDL: 23 mg/dL — ABNORMAL LOW (ref >39)
LDL Chol Calc (NIH): 56 mg/dL (ref 0–99)
Triglycerides: 792 mg/dL (ref 0–149)
VLDL Cholesterol Cal: 117 mg/dL — ABNORMAL HIGH (ref 5–40)

## 2019-12-29 LAB — MICROALBUMIN / CREATININE URINE RATIO
Creatinine, Urine: 118.5 mg/dL
Microalb/Creat Ratio: 83 mg/g creat — ABNORMAL HIGH (ref 0–29)
Microalbumin, Urine: 98.3 ug/mL

## 2019-12-29 LAB — HGB A1C W/O EAG: Hgb A1c MFr Bld: 9.8 % — ABNORMAL HIGH (ref 4.8–5.6)

## 2020-01-05 ENCOUNTER — Telehealth: Payer: Self-pay

## 2020-01-05 DIAGNOSIS — Z794 Long term (current) use of insulin: Secondary | ICD-10-CM

## 2020-01-05 DIAGNOSIS — I10 Essential (primary) hypertension: Secondary | ICD-10-CM

## 2020-01-05 DIAGNOSIS — E785 Hyperlipidemia, unspecified: Secondary | ICD-10-CM

## 2020-01-05 DIAGNOSIS — E119 Type 2 diabetes mellitus without complications: Secondary | ICD-10-CM

## 2020-01-05 NOTE — Telephone Encounter (Signed)
-----   Message from Glori Luis, MD sent at 01/01/2020  6:23 PM EDT ----- Please let the patient know that her urine has protein in it and I would like to start her on a medication to help with this and help protect her kidneys called lisinopril. Her triglycerides are also significantly elevated. I would like to start her on a statin for that. Given that we are considering starting those medications please confirm that she has had a tubal ligation in the past as there would be a risk of harm to a fetus with these medications if she were to get pregnant. Her A1c is uncontrolled at 9.8. I would like to increase her dose of trulicity and have her see Catie to help manage her diabetes. I can refer her once you speak with her. Thanks.

## 2020-01-05 NOTE — Telephone Encounter (Signed)
I received a call back from the patient and  informed her of her lab results. Patient is okay with taking Lisinopril and taking a statin, she confirmed that she did have a tubal ligation in the past.  She is ok with the increase in trulicity and seeing Catie to manage her diabetes.  Jamill Wetmore,cma

## 2020-01-05 NOTE — Telephone Encounter (Signed)
Pt called back returning your call °

## 2020-01-06 MED ORDER — ROSUVASTATIN CALCIUM 40 MG PO TABS: 40.0000 mg | ORAL_TABLET | Freq: Every day | ORAL | 3 refills | Status: DC

## 2020-01-06 MED ORDER — TRULICITY 3 MG/0.5ML ~~LOC~~ SOAJ: 3.0000 mg | SUBCUTANEOUS | 1 refills | Status: DC

## 2020-01-06 MED ORDER — LISINOPRIL 10 MG PO TABS: 10.0000 mg | ORAL_TABLET | Freq: Every day | ORAL | 3 refills | Status: DC

## 2020-01-06 NOTE — Telephone Encounter (Signed)
I called and spoke with the patient and informed her that the 3 medications Lisinopril, Crestor and Trulicity was sent to the pharmacy and she understood.  I also informed her that she needed labs for the Lisinopril and Crestor and the patient stated that the provider usually puts her labs on a prescription because she goes somewhere else for her labs and she will come by and pick up. So do I just write those out on the RX pad and get your signature?   I informed the patient that if she experiences any swelling of her lips and tongue to be evaluated immediately and she understood.

## 2020-01-06 NOTE — Telephone Encounter (Signed)
Lisinopril, Crestor, and Trulicity sent to pharmacy.  Patient needs lab work in 7 to 10 days to recheck her BMP to check her kidney function and electrolytes as she has started on the lisinopril.  If she ever develops any tongue or lip or mouth swelling after starting the lisinopril she needs to be evaluated immediately.  She also needs lab work in 6 weeks to check her cholesterol again.  Orders have been placed.  Thanks.

## 2020-01-06 NOTE — Addendum Note (Signed)
Addended by: Birdie Sons Lynzie Cliburn G on: 01/06/2020 12:56 PM   Modules accepted: Orders

## 2020-01-09 ENCOUNTER — Telehealth: Payer: Self-pay

## 2020-01-09 NOTE — Telephone Encounter (Signed)
I called and left a VM for the patient to pick up her Rx for labs to be drawn.  Raychell Holcomb,cma

## 2020-01-09 NOTE — Telephone Encounter (Addendum)
Paper prescriptions completed for the patient. She needs the BMET this Friday or next Monday. She needs the lipid panel and hepatic function panel mid October. Please let her know these are available for pick up.

## 2020-01-27 ENCOUNTER — Telehealth (INDEPENDENT_AMBULATORY_CARE_PROVIDER_SITE_OTHER): Payer: BC Managed Care – PPO | Admitting: Family Medicine

## 2020-01-27 ENCOUNTER — Telehealth: Payer: Self-pay | Admitting: Family Medicine

## 2020-01-27 DIAGNOSIS — E785 Hyperlipidemia, unspecified: Secondary | ICD-10-CM | POA: Diagnosis not present

## 2020-01-27 DIAGNOSIS — I10 Essential (primary) hypertension: Secondary | ICD-10-CM | POA: Diagnosis not present

## 2020-01-27 DIAGNOSIS — E119 Type 2 diabetes mellitus without complications: Secondary | ICD-10-CM

## 2020-01-27 DIAGNOSIS — Z794 Long term (current) use of insulin: Secondary | ICD-10-CM | POA: Diagnosis not present

## 2020-01-27 NOTE — Progress Notes (Signed)
Virtual Visit via video Note  This visit type was conducted due to national recommendations for restrictions regarding the COVID-19 pandemic (e.g. social distancing).  This format is felt to be most appropriate for this patient at this time.  All issues noted in this document were discussed and addressed.  No physical exam was performed (except for noted visual exam findings with Video Visits).   I connected with Allison ([place]) [name] today at  3:15 PM EDT by a video enabled telemedicine application and verified that I am speaking with the correct person using two identifiers. Location patient: car Location provider: work  Persons participating in the virtual visit: patient, provider  I discussed the limitations, risks, security and privacy concerns of performing an evaluation and management service by telephone and the availability of in person appointments. I also discussed with the patient that there may be a patient responsible charge related to this service. The patient expressed understanding and agreed to proceed.  Reason for visit: f/u.  HPI: HYPERTENSION Disease Monitoring: Blood pressure range-has not checked Chest pain- no      Dyspnea- no Medications: Compliance- taking amlodipine, lisinopril    Edema- no Has not gotten follow-up labs for the lisinopril.   DIABETES Disease Monitoring: Blood Sugar ranges-140s, excursions to 180s Polyuria/phagia/dipsia- no      Optho- UTD Medications: Compliance- taking trulicity, lantus 40 u Hypoglycemic symptoms- no Has cut down on sweets.    HYPERLIPIDEMIA Disease Monitoring: See symptoms for Hypertension Medications: Compliance- taking crestor Right upper quadrant pain- no  Muscle aches- no  Possible exposure to COVID-19: Patient notes she visited her father at his living facility and has subsequently been advised to employees and one resident tested positive for Covid.  This precluded her from coming into the office.  She does not know  if she had a direct exposure with anybody.  She has had no symptoms.    ROS: See pertinent positives and negatives per HPI.  Past Medical History:  Diagnosis Date  . Anxiety   . Depression   . Diabetes mellitus without complication (HCC)   . Hypertension     Past Surgical History:  Procedure Laterality Date  . CHOLECYSTECTOMY  2003  . TUBAL LIGATION      Family History  Problem Relation Age of Onset  . Cancer Mother 69       breast cancer  . Diabetes Mother   . Multiple sclerosis Father 50       Progressive - not diagnosed until age 46  . Diabetes Paternal Aunt   . Diabetes Maternal Grandfather   . Diabetes Paternal Aunt     SOCIAL HX: smoker   Current Outpatient Medications:  .  amLODipine (NORVASC) 5 MG tablet, Take 1 tablet (5 mg total) by mouth daily., Disp: 90 tablet, Rfl: 3 .  Dulaglutide (TRULICITY) 3 MG/0.5ML SOPN, Inject 0.5 mLs (3 mg total) as directed once a week., Disp: 7 mL, Rfl: 1 .  escitalopram (LEXAPRO) 10 MG tablet, Take 1 tablet (10 mg total) by mouth daily., Disp: 90 tablet, Rfl: 1 .  glucose blood test strip, Use as instructed, Disp: 100 each, Rfl: 11 .  insulin glargine (LANTUS SOLOSTAR) 100 UNIT/ML Solostar Pen, Inject 40 Units into the skin daily., Disp: 15 mL, Rfl: 0 .  lisinopril (ZESTRIL) 10 MG tablet, Take 1 tablet (10 mg total) by mouth daily., Disp: 90 tablet, Rfl: 3 .  rosuvastatin (CRESTOR) 40 MG tablet, Take 1 tablet (40 mg total) by mouth daily., Disp:  90 tablet, Rfl: 3 .  Vitamin D, Ergocalciferol, (DRISDOL) 1.25 MG (50000 UT) CAPS capsule, Take 1 capsule (50,000 Units total) by mouth 2 (two) times a week., Disp: 30 capsule, Rfl: 2  EXAM:  VITALS per patient if applicable:  GENERAL: alert, oriented, appears well and in no acute distress  HEENT: atraumatic, conjunttiva clear, no obvious abnormalities on inspection of external nose and ears  NECK: normal movements of the head and neck  LUNGS: on inspection no signs of respiratory  distress, breathing rate appears normal, no obvious gross SOB, gasping or wheezing  CV: no obvious cyanosis  MS: moves all visible extremities without noticeable abnormality  PSYCH/NEURO: pleasant and cooperative, no obvious depression or anxiety, speech and thought processing grossly intact  ASSESSMENT AND PLAN:  Discussed the following assessment and plan:  Benign essential HTN Undetermined control.  She will try to have this checked at employee health at her job.  She will continue lisinopril 10 mg once daily and amlodipine 5 mg once daily.  Type 2 diabetes mellitus without complication, with long-term current use of insulin (HCC) Seems to have improved slightly.  She will continue Trulicity 3 mg once weekly and Lantus 40 units once daily.  Continue to cut down on sweets.  HLD (hyperlipidemia) Continue Crestor.  She will have her labs completed to follow-up on her triglycerides.  These will be faxed to the wellness clinic at [facility].   Possible COVID-19 exposure: The patient does not have a confirmed direct exposure.  The patient is fully vaccinated.  She has no symptoms.  Discussed that she does not have to quarantine unless she gets symptoms and then she should quarantine and be tested.  No orders of the defined types were placed in this encounter.   No orders of the defined types were placed in this encounter.    I discussed the assessment and treatment plan with the patient. The patient was provided an opportunity to ask questions and all were answered. The patient agreed with the plan and demonstrated an understanding of the instructions.   The patient was advised to call back or seek an in-person evaluation if the symptoms worsen or if the condition fails to improve as anticipated.    Marikay Alar, MD

## 2020-01-27 NOTE — Telephone Encounter (Signed)
Switched appt to virtual visit for today.

## 2020-01-27 NOTE — Assessment & Plan Note (Signed)
Seems to have improved slightly.  She will continue Trulicity 3 mg once weekly and Lantus 40 units once daily.  Continue to cut down on sweets.

## 2020-01-27 NOTE — Telephone Encounter (Signed)
Patient called she was expose to covid just found out.

## 2020-01-27 NOTE — Assessment & Plan Note (Signed)
Undetermined control.  She will try to have this checked at employee health at her job.  She will continue lisinopril 10 mg once daily and amlodipine 5 mg once daily.

## 2020-01-27 NOTE — Assessment & Plan Note (Signed)
Continue Crestor.  She will have her labs completed to follow-up on her triglycerides.  These will be faxed to the wellness clinic at [facility].

## 2020-01-30 ENCOUNTER — Other Ambulatory Visit: Payer: Self-pay | Admitting: Family Medicine

## 2020-01-30 DIAGNOSIS — E119 Type 2 diabetes mellitus without complications: Secondary | ICD-10-CM

## 2020-01-30 DIAGNOSIS — Z794 Long term (current) use of insulin: Secondary | ICD-10-CM

## 2020-01-31 NOTE — Progress Notes (Signed)
Faxed lab order to patient at [phone] per patient request.  Fax confirmed sent.  Maquita Sandoval,cma

## 2020-02-10 NOTE — Telephone Encounter (Signed)
Patient picked up lab paperwork. Geraldy Akridge,cma

## 2020-02-22 ENCOUNTER — Encounter: Payer: Medicaid Other | Admitting: Certified Nurse Midwife

## 2020-02-23 ENCOUNTER — Telehealth (INDEPENDENT_AMBULATORY_CARE_PROVIDER_SITE_OTHER): Payer: BC Managed Care – PPO | Admitting: Nurse Practitioner

## 2020-02-23 ENCOUNTER — Other Ambulatory Visit (HOSPITAL_COMMUNITY): Payer: Self-pay | Admitting: Physician Assistant

## 2020-02-23 ENCOUNTER — Other Ambulatory Visit: Payer: Self-pay

## 2020-02-23 ENCOUNTER — Encounter: Payer: Self-pay | Admitting: Nurse Practitioner

## 2020-02-23 ENCOUNTER — Encounter: Payer: Self-pay | Admitting: Physician Assistant

## 2020-02-23 VITALS — Temp 101.9°F | Ht 62.0 in | Wt 203.0 lb

## 2020-02-23 DIAGNOSIS — U071 COVID-19: Secondary | ICD-10-CM

## 2020-02-23 DIAGNOSIS — Z8616 Personal history of COVID-19: Secondary | ICD-10-CM | POA: Insufficient documentation

## 2020-02-23 DIAGNOSIS — F17210 Nicotine dependence, cigarettes, uncomplicated: Secondary | ICD-10-CM

## 2020-02-23 DIAGNOSIS — Z6841 Body Mass Index (BMI) 40.0 and over, adult: Secondary | ICD-10-CM

## 2020-02-23 DIAGNOSIS — Z794 Long term (current) use of insulin: Secondary | ICD-10-CM

## 2020-02-23 DIAGNOSIS — E119 Type 2 diabetes mellitus without complications: Secondary | ICD-10-CM | POA: Diagnosis not present

## 2020-02-23 MED ORDER — DEXTROMETHORPHAN-GUAIFENESIN 5-100 MG/5ML PO LIQD: 10.0000 mL | Freq: Every evening | ORAL | 0 refills | Status: DC | PRN

## 2020-02-23 MED ORDER — BENZONATATE 100 MG PO CAPS: 200.0000 mg | ORAL_CAPSULE | Freq: Three times a day (TID) | ORAL | 0 refills | Status: DC | PRN

## 2020-02-23 NOTE — Patient Instructions (Addendum)
I have placed a referral into the monoclonal antibody for fusion clinic and you should get a call from them.  Please begin vitamins- as tolerated and if upset stomach- may hold These are to boost immune system.  Over the counter:   Vit C 1000 mg daily  Vitamin D3 4000 IU daily  Zinc 100 g daily  Quercetin 250 mg -500 mg twice daily   I have ordered at your pharmacy: Jerilynn Som for daytime cough.  Mucinex DM to take at bedtime for cough.    1) Consider buying a pulse oximeter at drug store to monitor your oxygen level.  If your oxygen level falls to 94% and stays there, seek in-person evaluation at Acute Care. If it falls below below 90% and stays there, go to the ER  For urgent assistance.    2) Rest, hydrate very well, eat healthy protein food. Check your blood sugar. See sick care diabetes below. Tylenol or Advil as directed. Utilize over the counter medications to treat symptoms.   3) Seek  treatment in the ED if respiratory issues/distress develops or unrelieved chest pain.   4) Continue isolation for a minimum of 10 days.  All symptoms should be improving or resolved in order to break quarantine AND you must be fever free without the use of tylenol, aleve or motrin for 72 hours  before breaking isolation  and/or returning to work .   5) Follow up if fever >101, if symptoms worsen or if symptoms are not improved in 3 days.   6) Telemedicine visit Monday  10 Things You Can Do to Manage Your COVID-19 Symptoms at Home If you have possible or confirmed COVID-19: 1. Stay home from work and school. And stay away from other public places. If you must go out, avoid using any kind of public transportation, ridesharing, or taxis. 2. Monitor your symptoms carefully. If your symptoms get worse, call your healthcare provider immediately. 3. Get rest and stay hydrated. 4. If you have a medical appointment, call the healthcare provider ahead of time and tell them that you have or may  have COVID-19. 5. For medical emergencies, call 911 and notify the dispatch personnel that you have or may have COVID-19. 6. Cover your cough and sneezes with a tissue or use the inside of your elbow. 7. Wash your hands often with soap and water for at least 20 seconds or clean your hands with an alcohol-based hand sanitizer that contains at least 60% alcohol. 8. As much as possible, stay in a specific room and away from other people in your home. Also, you should use a separate bathroom, if available. If you need to be around other people in or outside of the home, wear a mask. 9. Avoid sharing personal items with other people in your household, like dishes, towels, and bedding. 10. Clean all surfaces that are touched often, like counters, tabletops, and doorknobs. Use household cleaning sprays or wipes according to the label instructions. [email or web address] 11/10/2018 This information is not intended to replace advice given to you by your health care provider. Make sure you discuss any questions you have with your health care provider. Document Revised: 04/14/2019 Document Reviewed: 04/14/2019 Elsevier Patient Education  2020 Else[employer] Inc.  Diabetes Mellitus and Sick Day Management Blood sugar (glucose) can be difficult to control when you are sick. Common illnesses that can cause problems for people with diabetes (diabetes mellitus) include colds, fever, flu (influenza), nausea, vomiting, and diarrhea. These illnesses can cause stress  and loss of body fluids (dehydration), and those issues can cause blood glucose levels to increase. Because of this, it is very important to take your insulin and diabetes medicines and eat some form of carbohydrate when you are sick. You should make a plan for days when you are sick (sick day plan) as part of your diabetes management plan. You and your health care provider should make this plan in advance. The following guidelines are intended to help you manage an  illness that lasts for about 24 hours or less. Your health care provider may also give you more specific instructions. What do I need to do to manage my blood glucose?   Check your blood glucose every 2-4 hours, or as often as told by your health care provider.  Know your sick day treatment goals. Your target blood glucose levels may be different when you are sick.  If you use insulin, take your usual dose. ? If your blood glucose continues to be too high, you may need to take an additional insulin dose as told by your health care provider.  If you use oral diabetes medicine, you may need to stop taking it if you are not able to eat or drink normally. Ask your health care provider about whether you need to stop taking these medicines while you are sick.  If you use injectable hormone medicines other than insulin to control your diabetes, ask your health care provider about whether you need to stop taking these medicines while you are sick. What else can I do to manage my diabetes when I am sick? Check your ketones  If you have type 1 diabetes, check your urine ketones every 4 hours.  If you have type 2 diabetes, check your urine ketones as often as told by your health care provider. Drink fluids  Drink enough fluid to keep your urine clear or pale yellow. This is especially important if you have a fever, vomiting, or diarrhea. Those symptoms can lead to dehydration.  Follow any instructions from your health care provider about beverages to avoid. ? Do not drink alcohol, caffeine, or drinks that contain a lot of sugar. Take medicines as directed  Take-over-the-counter and prescription medicines only as told by your health care provider.  Check medicine labels for added sugars. Some medicines may contain sugar or types of sugars that can raise your blood glucose level. What foods can I eat when I am sick?  You need to eat some form of carbohydrates when you are sick. You should eat  45-50 grams (45-50 g) of carbohydrates every 3-4 hours until you feel better. All of the food choices below contain about 15 g of carbohydrates. Plan ahead and keep some of these foods around so you have them if you get sick.  4-6 oz (120-177 mL) carbonated beverage that contains sugar, such as regular (not diet) soda. You may be able to drink carbonated beverages more easily if you open the beverage and let it sit at room temperature for a few minutes before drinking.   of a twin frozen ice pop.  4 oz (120 g) regular gelatin.  4 oz (120 mL) fruit juice.  4 oz (120 g) ice cream or frozen yogurt.  2 oz (60 g) sherbet.  8 oz (240 mL) clear broth or soup.  4 oz (120 g) regular custard.  4 oz (120 g) regular pudding.  8 oz (240 g) plain yogurt.  1 slice bread or toast.  6 saltine  crackers.  5 vanilla wafers. Questions to ask your health care provider Consider asking the following questions so you know what to do on days when you are sick:  Should I adjust my diabetes medicines?  How often do I need to check my blood glucose?  What supplies do I need to manage my diabetes at home when I am sick?  What number can I call if I have questions?  What foods and drinks should I avoid? Contact a health care provider if:  You develop symptoms of diabetic ketoacidosis, such as: ? Fatigue. ? Weight loss. ? Excessive thirst. ? Light-headedness. ? Fruity or sweet-smelling breath. ? Excessive urination. ? Vision changes. ? Confusion or irritability. ? Nausea. ? Vomiting. ? Rapid breathing. ? Pain in the abdomen. ? Feeling flushed.  You are unable to drink fluids without vomiting.  You have any of the following for more than 6 hours: ? Nausea. ? Vomiting. ? Diarrhea.  Your blood glucose is at or above 240 mg/dL (00.9 mmol/L), even after you take an additional insulin dose.  You have a change in how you think, feel, or act (mental status).  You develop another serious  illness.  You have been sick or have had a fever for 2 days or longer and you are not getting better. Get help right away if:  Your blood glucose is lower than 54 mg/dL (3.0 mmol/L).  You have difficulty breathing.  You have moderate or high ketone levels in your urine.  You used emergency glucagon to treat low blood glucose. Summary  Blood sugar (glucose) can be difficult to control when you are sick. Common illnesses that can cause problems for people with diabetes (diabetes mellitus) include colds, fever, flu (influenza), nausea, vomiting, and diarrhea.  Illnesses can cause stress and loss of body fluids (dehydration), and those issues can cause blood glucose levels to increase.  Make a plan for days when you are sick (sick day plan) as part of your diabetes management plan. You and your health care provider should make this plan in advance.  It is very important to take your insulin and diabetes medicines and to eat some form of carbohydrate when you are sick.  Contact your health care provider if have problems managing your blood glucose levels when you are sick, or if you have been sick or had a fever for 2 days or longer and are not getting better. This information is not intended to replace advice given to you by your health care provider. Make sure you discuss any questions you have with your health care provider. Document Revised: 01/25/2016 Document Reviewed: 01/25/2016 Elsevier Patient Education  2020 [employer].

## 2020-02-23 NOTE — Progress Notes (Signed)
I connected by phone with [facility] Coull on 02/23/2020 at 4:11 PM to discuss the potential use of a new treatment for mild to moderate COVID-19 viral infection in non-hospitalized patients.  This patient is a 34 y.o. female that meets the FDA criteria for Emergency Use Authorization of COVID monoclonal antibody casirivimab/imdevimab or bamlanivimab/eteseviamb.  Has a (+) direct SARS-CoV-2 viral test result  Has mild or moderate COVID-19   Is NOT hospitalized due to COVID-19  Is within 10 days of symptom onset  Has at least one of the high risk factor(s) for progression to severe COVID-19 and/or hospitalization as defined in EUA.  Specific high risk criteria : BMI > 25, Diabetes and Cardiovascular disease or hypertension   I have spoken and communicated the following to the patient or parent/caregiver regarding COVID monoclonal antibody treatment:  1. FDA has authorized the emergency use for the treatment of mild to moderate COVID-19 in adults and pediatric patients with positive results of direct SARS-CoV-2 viral testing who are 34 years of age and older weighing at least 40 kg, and who are at high risk for progressing to severe COVID-19 and/or hospitalization.  2. The significant known and potential risks and benefits of COVID monoclonal antibody, and the extent to which such potential risks and benefits are unknown.  3. Information on available alternative treatments and the risks and benefits of those alternatives, including clinical trials.  4. Patients treated with COVID monoclonal antibody should continue to self-isolate and use infection control measures (e.g., wear mask, isolate, social distance, avoid sharing personal items, clean and disinfect "high touch" surfaces, and frequent handwashing) according to CDC guidelines.   5. The patient or parent/caregiver has the option to accept or refuse COVID monoclonal antibody treatment.  After reviewing this information with the  patient, the patient has agreed to receive one of the available covid 19 monoclonal antibodies and will be provided an appropriate fact sheet prior to infusion. Jodell Cipro, PA-C 02/23/2020 4:11 PM

## 2020-02-23 NOTE — Progress Notes (Signed)
Virtual Visit via Video Note  This visit type was conducted due to national recommendations for restrictions regarding the COVID-19 pandemic (e.g. social distancing).  This format is felt to be most appropriate for this patient at this time.  All issues noted in this document were discussed and addressed.  No physical exam was performed (except for noted visual exam findings with Video Visits).   I connected with@ on 02/23/20 at  9:00 AM EDT by a video enabled telemedicine application or telephone and verified that I am speaking with the correct person using two identifiers. Location patient: home Location provider: work or home office Persons participating in the virtual visit: patient, provider  I discussed the limitations, risks, security and privacy concerns of performing an evaluation and management service by telephone and the availability of in person appointments. I also discussed with the patient that there may be a patient responsible charge related to this service. The patient expressed understanding and agreed to proceed.  Reason for visit: Positive Covid home test yesterday.  HPI: This 34 year old patient with BMI of 37, diabetes mellitus on insulin, hyperlipidemia, depression, developed  sore throat on Monday.  By Tuesday- Wednesday she had body aches, cough, chills, with a T-max 102.3, headache. Yesterday was the worst day.  Feeling a little better today.  She took NyQuil and it helped.  Today, she has headache, body aches, fatigue, cough. No chest pain, shortness of breath, wheezing, or chest tightness.  No GI complaints.  T-max today 101.9.  Lost taste and smell. She is eating soup, fluids, needs to hydrate more.  She has stopped her diabetic meds on Monday since she has not been eating.  She has not checked her blood sugars today.  Her husband has Covid, has 2 children one with strep throat.  She had the Covid vaccine [employer] 08/19/2019. She has not had the flu vaccine.    ROS: See  pertinent positives and negatives per HPI.  Past Medical History:  Diagnosis Date  . Anxiety   . Depression   . Diabetes mellitus without complication (HCC)   . Hypertension     Past Surgical History:  Procedure Laterality Date  . CHOLECYSTECTOMY  2003  . TUBAL LIGATION      Family History  Problem Relation Age of Onset  . Cancer Mother 28       breast cancer  . Diabetes Mother   . Multiple sclerosis Father 38       Progressive - not diagnosed until age 42  . Diabetes Paternal Aunt   . Diabetes Maternal Grandfather   . Diabetes Paternal Aunt     SOCIAL HX: Current everyday smoker 1 pack/day x 10 years.  She quit smoking during this illness.   Current Outpatient Medications:  .  amLODipine (NORVASC) 5 MG tablet, Take 1 tablet (5 mg total) by mouth daily., Disp: 90 tablet, Rfl: 3 .  Dulaglutide (TRULICITY) 3 MG/0.5ML SOPN, Inject 0.5 mLs (3 mg total) as directed once a week., Disp: 7 mL, Rfl: 1 .  escitalopram (LEXAPRO) 10 MG tablet, Take 1 tablet (10 mg total) by mouth daily., Disp: 90 tablet, Rfl: 1 .  glucose blood test strip, Use as instructed, Disp: 100 each, Rfl: 11 .  LANTUS SOLOSTAR 100 UNIT/ML Solostar Pen, INJECT 22 UNITS SUBCUTANEOUSLY ONCE DAILY AT 10PM. NEED TO SCHEDULE A FOLLOW UP, Disp: 15 mL, Rfl: 0 .  lisinopril (ZESTRIL) 10 MG tablet, Take 1 tablet (10 mg total) by mouth daily., Disp: 90  tablet, Rfl: 3 .  rosuvastatin (CRESTOR) 40 MG tablet, Take 1 tablet (40 mg total) by mouth daily., Disp: 90 tablet, Rfl: 3 .  Vitamin D, Ergocalciferol, (DRISDOL) 1.25 MG (50000 UT) CAPS capsule, Take 1 capsule (50,000 Units total) by mouth 2 (two) times a week., Disp: 30 capsule, Rfl: 2 .  benzonatate (TESSALON PERLES) 100 MG capsule, Take 2 capsules (200 mg total) by mouth 3 (three) times daily as needed for cough., Disp: 20 capsule, Rfl: 0 .  Dextromethorphan-guaiFENesin 5-100 MG/5ML LIQD, Take 10 mLs by mouth at bedtime as needed (for cough)., Disp: 70 mL, Rfl:  0  EXAM:  VITALS per patient if applicable: Weight 203 temp 101.9  GENERAL: alert, oriented, appears mildly ill and  in no acute distress  HEENT: atraumatic, conjunctiva clear, nasal congestion noted.    NECK: normal movements of the head and neck  LUNGS: on inspection no signs of respiratory distress, breathing rate appears normal, no obvious gross SOB, gasping or wheezing.  Cough noted.  CV: no obvious cyanosis  MS: moves all visible extremities without noticeable abnormality  PSYCH/NEURO: pleasant and cooperative, no obvious depression or anxiety, speech and thought processing grossly intact  ASSESSMENT AND PLAN:  Discussed the following assessment and plan:  COVID-19 virus infection  Morbid obesity with BMI of 40.0-44.9, adult (HCC)  Type 2 diabetes mellitus without complication, with long-term current use of insulin (HCC)  Nicotine dependence, cigarettes, uncomplicated  No problem-specific Assessment & Plan notes found for this encounter.   I have placed a referral into the monoclonal antibody for fusion clinic and you should get a call from them.  Please begin vitamins- as tolerated and if upset stomach- may hold These are to boost immune system.  Over the counter:   Vit C 1000 mg daily  Vitamin D3 4000 IU daily  Zinc 100 g daily  Quercetin 250 mg -500 mg twice daily   I have ordered at your pharmacy: Jerilynn Som for daytime cough.  Mucinex DM to take at bedtime for cough.    1) Consider buying a pulse oximeter at drug store to monitor your oxygen level.  If your oxygen level falls to 94% and stays there, seek in-person evaluation at Acute Care. If it falls below below 90% and stays there, go to the ER  For urgent assistance.    2) Rest, hydrate very well, eat healthy protein food.Check your blood sugar. Follow sick care for DM. Tylenol or Advil as directed. Utilize over the counter medications to treat symptoms.   3) Seek  treatment in the ED if  respiratory issues/distress develops or unrelieved chest pain.   4) Continue isolation for a minimum of 10 days.  All symptoms should be improving or resolved in order to break quarantine AND you must be fever free without the use of tylenol, aleve or motrin for 72 hours  before breaking isolation  and/or returning to work  I discussed the assessment and treatment plan with the patient. The patient was provided an opportunity to ask questions and all were answered. The patient agreed with the plan and demonstrated an understanding of the instructions.   5) Follow up if fever >101, if symptoms worsen or if symptoms are not improved in 3 days.   6) Telemedicine visit Monday  The patient was advised to call back or seek an in-person evaluation if the symptoms worsen or if the condition fails to improve as anticipated.  Amedeo Kinsman, NP Adult Nurse Practitioner [facility]  [employer] [phone]

## 2020-02-24 ENCOUNTER — Telehealth: Payer: Self-pay | Admitting: *Deleted

## 2020-02-24 NOTE — Telephone Encounter (Signed)
-----   Message from Theadore Nan, NP sent at 02/23/2020 12:42 PM EDT ----- Covid home test positive.

## 2020-02-25 ENCOUNTER — Ambulatory Visit (HOSPITAL_COMMUNITY): Payer: BC Managed Care – PPO | Attending: Pulmonary Disease

## 2020-04-02 ENCOUNTER — Ambulatory Visit: Payer: BC Managed Care – PPO | Admitting: Family Medicine

## 2020-04-02 DIAGNOSIS — Z0289 Encounter for other administrative examinations: Secondary | ICD-10-CM

## 2020-04-04 ENCOUNTER — Other Ambulatory Visit: Payer: Self-pay | Admitting: Family Medicine

## 2020-04-04 DIAGNOSIS — E119 Type 2 diabetes mellitus without complications: Secondary | ICD-10-CM

## 2020-04-04 NOTE — Telephone Encounter (Signed)
Sent to pharmacy. She needs to keep her upcoming appointment as she missed her appointment on Monday.

## 2020-04-04 NOTE — Telephone Encounter (Signed)
Pt called and needs this refill  Pt stated that Dr. Birdie Sons increased her to 40 units a day

## 2020-04-18 ENCOUNTER — Other Ambulatory Visit: Payer: Self-pay

## 2020-04-18 ENCOUNTER — Ambulatory Visit (INDEPENDENT_AMBULATORY_CARE_PROVIDER_SITE_OTHER): Payer: BC Managed Care – PPO | Admitting: Family Medicine

## 2020-04-18 ENCOUNTER — Encounter: Payer: Self-pay | Admitting: Family Medicine

## 2020-04-18 VITALS — BP 140/80 | HR 85 | Temp 98.1°F | Ht 62.0 in | Wt 205.0 lb

## 2020-04-18 DIAGNOSIS — I10 Essential (primary) hypertension: Secondary | ICD-10-CM

## 2020-04-18 DIAGNOSIS — G5601 Carpal tunnel syndrome, right upper limb: Secondary | ICD-10-CM | POA: Diagnosis not present

## 2020-04-18 DIAGNOSIS — F320 Major depressive disorder, single episode, mild: Secondary | ICD-10-CM | POA: Diagnosis not present

## 2020-04-18 DIAGNOSIS — E119 Type 2 diabetes mellitus without complications: Secondary | ICD-10-CM

## 2020-04-18 DIAGNOSIS — Z794 Long term (current) use of insulin: Secondary | ICD-10-CM

## 2020-04-18 DIAGNOSIS — Z8616 Personal history of COVID-19: Secondary | ICD-10-CM

## 2020-04-18 MED ORDER — BLOOD GLUCOSE MONITOR KIT: PACK | 0 refills | Status: DC

## 2020-04-18 MED ORDER — AMLODIPINE BESYLATE 10 MG PO TABS: 10.0000 mg | ORAL_TABLET | Freq: Every day | ORAL | 1 refills | Status: DC

## 2020-04-18 MED ORDER — SERTRALINE HCL 50 MG PO TABS: 50.0000 mg | ORAL_TABLET | Freq: Every day | ORAL | 3 refills | Status: DC

## 2020-04-18 NOTE — Progress Notes (Signed)
Tommi Rumps, MD Phone: [phone]  Allison [name] is a 34 y.o. female who presents today for f/u.  DIABETES Disease Monitoring: Blood Sugar ranges-not checking polyuria/phagia/dipsia-some polydipsia      Optho-up-to-date Medications: Compliance- taking lantus, trulicity Hypoglycemic symptoms- no Has cut down on sweet intake  HYPERTENSION  Disease Monitoring  Home BP Monitoring not checking Chest pain- no    Dyspnea- no Medications  Compliance-  Taking amlodipine, lisinopril.  Edema- no  History of COVID-19: Patient notes she feels quite a bit better.  Taste and smell has not come all the way back.  Carpal tunnel syndrome: This is in her right hand.  She will get numbness and pain.  If she moves it and shakes it out this will improve significantly.  Hurts in her home.  Has worsened over the last several weeks.  No weakness.  Depression: Patient notes has had a lot going on in her life.  Notes the depression is worsened more so than usual.  She feels as the Lexapro is not helpful.  Denies SI.    Social History   Tobacco Use  Smoking Status Current Every Day Smoker  . Packs/day: 1.00  . Years: 10.00  . Pack years: 10.00  . Types: Cigarettes  . Start date: 05/14/2006  Smokeless Tobacco Never Used     ROS see history of present illness  Objective  Physical Exam Vitals:   04/18/20 1454  BP: 140/80  Pulse: 85  Temp: 98.1 F (36.7 C)    BP Readings from Last 3 Encounters:  04/18/20 140/80  11/17/18 (!) 147/99  08/17/18 (!) 158/111   Wt Readings from Last 3 Encounters:  04/18/20 205 lb (93 kg)  02/23/20 203 lb (92.1 kg)  12/21/19 208 lb (94.3 kg)    Physical Exam Constitutional:      General: She is not in acute distress.    Appearance: She is not diaphoretic.  Cardiovascular:     Rate and Rhythm: Normal rate and regular rhythm.     Heart sounds: Normal heart sounds.  Pulmonary:     Effort: Pulmonary effort is normal.     Breath sounds: Normal  breath sounds.  Musculoskeletal:        General: No edema.     Right lower leg: No edema.     Left lower leg: No edema.  Skin:    General: Skin is warm and dry.  Neurological:     Mental Status: She is alert.    Diabetic Foot Exam - Simple   Simple Foot Form Diabetic Foot exam was performed with the following findings: Yes 04/18/2020  3:10 PM  Visual Inspection No deformities, no ulcerations, no other skin breakdown bilaterally: Yes Sensation Testing Intact to touch and monofilament testing bilaterally: Yes Pulse Check Posterior Tibialis and Dorsalis pulse intact bilaterally: Yes Comments      Assessment/Plan: Please see individual problem list.  Problem List Items Addressed This Visit    Benign essential HTN - Primary    Uncontrolled.  Will increase her amlodipine to 10 mg once daily.  She will continue lisinopril 10 mg daily.  Follow-up in 1 month.      Relevant Medications   amLODipine (NORVASC) 10 MG tablet   Carpal tunnel syndrome    Worsening symptoms.  Advised to use her cock-up splints consistently.  We will refer to orthopedics.  Discussed if she develops any weakness or any persistent symptoms she should go to the orthopedic urgent care to be evaluated immediately.  Relevant Medications   sertraline (ZOLOFT) 50 MG tablet   Other Relevant Orders   Ambulatory referral to Orthopedic Surgery   Depression, major, single episode, mild ([place])    This has worsened.  We will transition her to Zoloft 50 mg once daily.  She will discontinue the Lexapro and then start on Zoloft the next day.  Follow-up in 1 month.      Relevant Medications   sertraline (ZOLOFT) 50 MG tablet   History of COVID-19    Recovering well.  She will monitor for complete resolution of her symptoms.      Type 2 diabetes mellitus without complication, with long-term current use of insulin (HCC)    Undetermined control.  She will continue Lantus 40 units once daily and Trulicity 3 mg once  weekly.  She will have an A1c and CMP checked through her work.  Written prescription given for this.  Advised to have this done in the next week and a half.      Relevant Medications   blood glucose meter kit and supplies KIT       This visit occurred during the SARS-CoV-2 public health emergency.  Safety protocols were in place, including screening questions prior to the visit, additional usage of staff PPE, and extensive cleaning of exam room while observing appropriate contact time as indicated for disinfecting solutions.    Tommi Rumps, MD [place]

## 2020-04-18 NOTE — Patient Instructions (Signed)
Nice to see you. Please get the labs done sometime in the next week. I have referred you to orthopedics.  If you have any of the symptoms we discussed please go to the orthopedic urgent care. We will increase her amlodipine to 10 mg once daily to help with your blood pressure. Please get the glucometer from your pharmacy. We will change you from Lexapro to Zoloft.  You can start the Zoloft the day after discontinuing the Lexapro.

## 2020-04-19 NOTE — Assessment & Plan Note (Signed)
Recovering well.  She will monitor for complete resolution of her symptoms.

## 2020-04-19 NOTE — Assessment & Plan Note (Signed)
Worsening symptoms.  Advised to use her cock-up splints consistently.  We will refer to orthopedics.  Discussed if she develops any weakness or any persistent symptoms she should go to the orthopedic urgent care to be evaluated immediately.

## 2020-04-19 NOTE — Assessment & Plan Note (Signed)
This has worsened.  We will transition her to Zoloft 50 mg once daily.  She will discontinue the Lexapro and then start on Zoloft the next day.  Follow-up in 1 month.

## 2020-04-19 NOTE — Assessment & Plan Note (Signed)
Undetermined control.  She will continue Lantus 40 units once daily and Trulicity 3 mg once weekly.  She will have an A1c and CMP checked through her work.  Written prescription given for this.  Advised to have this done in the next week and a half.

## 2020-04-19 NOTE — Assessment & Plan Note (Signed)
Uncontrolled.  Will increase her amlodipine to 10 mg once daily.  She will continue lisinopril 10 mg daily.  Follow-up in 1 month.

## 2020-05-15 DIAGNOSIS — G5603 Carpal tunnel syndrome, bilateral upper limbs: Secondary | ICD-10-CM | POA: Diagnosis not present

## 2020-05-21 ENCOUNTER — Ambulatory Visit: Payer: BC Managed Care – PPO | Admitting: Family Medicine

## 2020-05-22 ENCOUNTER — Other Ambulatory Visit: Payer: Medicaid Other

## 2020-05-22 ENCOUNTER — Other Ambulatory Visit: Payer: Self-pay

## 2020-05-22 ENCOUNTER — Other Ambulatory Visit: Payer: Self-pay | Admitting: Medical

## 2020-05-22 DIAGNOSIS — E785 Hyperlipidemia, unspecified: Secondary | ICD-10-CM

## 2020-05-22 DIAGNOSIS — I10 Essential (primary) hypertension: Secondary | ICD-10-CM

## 2020-05-22 NOTE — Progress Notes (Signed)
Unable to obtain labs. Sent to [facility].

## 2020-05-22 NOTE — Addendum Note (Signed)
Addended by: Warden Fillers on: 05/22/2020 03:51 PM   Modules accepted: Orders

## 2020-05-23 ENCOUNTER — Ambulatory Visit: Payer: BC Managed Care – PPO | Admitting: Family Medicine

## 2020-05-23 LAB — LIPID PANEL W/O CHOL/HDL RATIO
Cholesterol, Total: 126 mg/dL (ref 100–199)
HDL: 34 mg/dL — ABNORMAL LOW (ref >39)
LDL Chol Calc (NIH): 62 mg/dL (ref 0–99)
Triglycerides: 178 mg/dL — ABNORMAL HIGH (ref 0–149)
VLDL Cholesterol Cal: 30 mg/dL (ref 5–40)

## 2020-05-23 LAB — HEPATIC FUNCTION PANEL
ALT: 18 IU/L (ref 0–32)
AST: 10 IU/L (ref 0–40)
Albumin: 4.6 g/dL (ref 3.8–4.8)
Alkaline Phosphatase: 73 IU/L (ref 44–121)
Bilirubin Total: <0.2 mg/dL (ref 0.0–1.2)
Bilirubin, Direct: <0.1 mg/dL (ref 0.00–0.40)
Total Protein: 6.8 g/dL (ref 6.0–8.5)

## 2020-05-23 LAB — BASIC METABOLIC PANEL
BUN/Creatinine Ratio: 21 (ref 9–23)
BUN: 15 mg/dL (ref 6–20)
CO2: 21 mmol/L (ref 20–29)
Calcium: 9.2 mg/dL (ref 8.7–10.2)
Chloride: 98 mmol/L (ref 96–106)
Creatinine, Ser: 0.7 mg/dL (ref 0.57–1.00)
GFR calc Af Amer: 131 mL/min/{1.73_m2} (ref >59)
GFR calc non Af Amer: 113 mL/min/{1.73_m2} (ref >59)
Glucose: 181 mg/dL — ABNORMAL HIGH (ref 65–99)
Potassium: 4.3 mmol/L (ref 3.5–5.2)
Sodium: 134 mmol/L (ref 134–144)

## 2020-05-23 LAB — HGB A1C W/O EAG: Hgb A1c MFr Bld: 8.6 % — ABNORMAL HIGH (ref 4.8–5.6)

## 2020-05-30 ENCOUNTER — Encounter: Payer: Self-pay | Admitting: Family Medicine

## 2020-05-30 ENCOUNTER — Telehealth (INDEPENDENT_AMBULATORY_CARE_PROVIDER_SITE_OTHER): Payer: BC Managed Care – PPO | Admitting: Family Medicine

## 2020-05-30 ENCOUNTER — Ambulatory Visit: Payer: BC Managed Care – PPO | Admitting: Family Medicine

## 2020-05-30 DIAGNOSIS — I10 Essential (primary) hypertension: Secondary | ICD-10-CM

## 2020-05-30 DIAGNOSIS — F320 Major depressive disorder, single episode, mild: Secondary | ICD-10-CM

## 2020-05-30 DIAGNOSIS — E119 Type 2 diabetes mellitus without complications: Secondary | ICD-10-CM

## 2020-05-30 DIAGNOSIS — G5601 Carpal tunnel syndrome, right upper limb: Secondary | ICD-10-CM

## 2020-05-30 DIAGNOSIS — Z794 Long term (current) use of insulin: Secondary | ICD-10-CM

## 2020-05-30 MED ORDER — TRULICITY 3 MG/0.5ML ~~LOC~~ SOAJ: 3.0000 mg | SUBCUTANEOUS | 1 refills | Status: DC

## 2020-05-30 NOTE — Progress Notes (Signed)
Virtual Visit via telephone Note  This visit type was conducted due to national recommendations for restrictions regarding the COVID-19 pandemic (e.g. social distancing).  This format is felt to be most appropriate for this patient at this time.  All issues noted in this document were discussed and addressed.  No physical exam was performed (except for noted visual exam findings with Video Visits).   I connected with Allison [name] today at 11:00 AM EST by telephone and verified that I am speaking with the correct person using two identifiers. Location patient: home Location provider: home office Persons participating in the virtual visit: patient, provider  I discussed the limitations, risks, security and privacy concerns of performing an evaluation and management service by telephone and the availability of in person appointments. I also discussed with the patient that there may be a patient responsible charge related to this service. The patient expressed understanding and agreed to proceed.  Interactive audio and video telecommunications were attempted between this provider and patient, however failed, due to patient having technical difficulties OR patient did not have access to video capability.  We continued and completed visit with audio only.   Reason for visit: f/u  HPI: HYPERTENSION  Disease Monitoring  Home BP Monitoring not checking Chest pain- no    Dyspnea- no Medications  Compliance-  Taking amlodipine 10 mg daily, lisinopril 10 mg daily.   Edema- no  DIABETES Disease Monitoring: Blood Sugar ranges-not checking, A1c 8.6 Polyuria/phagia/dipsia-about a month ago for a few days      Optho- UTD Medications: Compliance-taking Lantus 40 units daily, Trulicity, patient believes she was taking the 1.5 mg dose of Trulicity for the last 2 months, just restarted the 3 mg dose              hypoglycemic symptoms-no  Depression: Patient notes not nearly as much depression.  She  feels as though the Zoloft has been helpful.  She felt good enough last week to get up early to get dressed and do her hair prior to going to the dentist.  She denies SI.  Carpal tunnel syndrome: Patient notes she saw orthopedics and they gave her a shot in her wrist.  She notes that shot pain into her fingers and made her hand feel worse.  They have gotten her set up for nerve conduction study later this month.  She is wearing her cock-up splints.      ROS: See pertinent positives and negatives per HPI.  Past Medical History:  Diagnosis Date  . Anxiety   . Depression   . Diabetes mellitus without complication ([place])   . Hypertension     Past Surgical History:  Procedure Laterality Date  . CHOLECYSTECTOMY  2003  . TUBAL LIGATION      Family History  Problem Relation Age of Onset  . Cancer Mother 48       breast cancer  . Diabetes Mother   . Multiple sclerosis Father 58       Progressive - not diagnosed until age 32  . Diabetes Paternal Aunt   . Diabetes Maternal Grandfather   . Diabetes Paternal Aunt     SOCIAL HX: Smoker   Current Outpatient Medications:  .  amLODipine (NORVASC) 10 MG tablet, Take 1 tablet (10 mg total) by mouth daily., Disp: 90 tablet, Rfl: 1 .  benzonatate (TESSALON PERLES) 100 MG capsule, Take 2 capsules (200 mg total) by mouth 3 (three) times daily as needed for cough., Disp: 20 capsule,  Rfl: 0 .  blood glucose meter kit and supplies KIT, Dispense based on patient and insurance preference. Use 3 times daily as directed. (FOR ICD-10 E11.9)., Disp: 1 each, Rfl: 0 .  Dextromethorphan-guaiFENesin 5-100 MG/5ML LIQD, Take 10 mLs by mouth at bedtime as needed (for cough)., Disp: 70 mL, Rfl: 0 .  glucose blood test strip, Use as instructed, Disp: 100 each, Rfl: 11 .  insulin glargine (LANTUS SOLOSTAR) 100 UNIT/ML Solostar Pen, Inject 40 Units into the skin at bedtime., Disp: 15 mL, Rfl: 0 .  lisinopril (ZESTRIL) 10 MG tablet, Take 1 tablet (10 mg total) by  mouth daily., Disp: 90 tablet, Rfl: 3 .  rosuvastatin (CRESTOR) 40 MG tablet, Take 1 tablet (40 mg total) by mouth daily., Disp: 90 tablet, Rfl: 3 .  sertraline (ZOLOFT) 50 MG tablet, Take 1 tablet (50 mg total) by mouth daily., Disp: 30 tablet, Rfl: 3 .  Vitamin D, Ergocalciferol, (DRISDOL) 1.25 MG (50000 UT) CAPS capsule, Take 1 capsule (50,000 Units total) by mouth 2 (two) times a week., Disp: 30 capsule, Rfl: 2 .  Dulaglutide (TRULICITY) 3 BD/5.3GD SOPN, Inject 3 mg as directed once a week., Disp: 7 mL, Rfl: 1  EXAM: This was a telephone visit and thus no physical exam was completed.  ASSESSMENT AND PLAN:  Discussed the following assessment and plan:  Problem List Items Addressed This Visit    Benign essential HTN    Undetermined control.  She will come in for a nurse BP check in about a week.  Continue amlodipine and lisinopril.      Carpal tunnel syndrome    She will continue her cock-up splints.  She will follow-up with orthopedics as planned.      Depression, major, single episode, mild (HCC)    Much improved.  She declines further dose titration of her Zoloft.  She will continue Zoloft 50 mg once daily.      Type 2 diabetes mellitus without complication, with long-term current use of insulin (HCC)    Her A1c was improved compared to previously.  It sounds as though she was on a lower dose of Trulicity than prescribed.  She will resume the 3 mg dose of Trulicity and continue her Lantus 40 units once daily.  We will see her back in 3 months and recheck her A1c at that time.  She has been intolerant of Jardiance in the past due to yeast infections.      Relevant Medications   Dulaglutide (TRULICITY) 3 JM/4.2AS SOPN       I discussed the assessment and treatment plan with the patient. The patient was provided an opportunity to ask questions and all were answered. The patient agreed with the plan and demonstrated an understanding of the instructions.   The patient was advised  to call back or seek an in-person evaluation if the symptoms worsen or if the condition fails to improve as anticipated.  I provided 12 minutes of non-face-to-face time during this encounter.   Tommi Rumps, MD

## 2020-05-30 NOTE — Assessment & Plan Note (Signed)
Her A1c was improved compared to previously.  It sounds as though she was on a lower dose of Trulicity than prescribed.  She will resume the 3 mg dose of Trulicity and continue her Lantus 40 units once daily.  We will see her back in 3 months and recheck her A1c at that time.  She has been intolerant of Jardiance in the past due to yeast infections.

## 2020-05-30 NOTE — Assessment & Plan Note (Signed)
Undetermined control.  She will come in for a nurse BP check in about a week.  Continue amlodipine and lisinopril.

## 2020-05-30 NOTE — Assessment & Plan Note (Signed)
Much improved.  She declines further dose titration of her Zoloft.  She will continue Zoloft 50 mg once daily.

## 2020-05-30 NOTE — Assessment & Plan Note (Signed)
She will continue her cock-up splints.  She will follow-up with orthopedics as planned.

## 2020-06-02 ENCOUNTER — Other Ambulatory Visit: Payer: Self-pay | Admitting: Family Medicine

## 2020-06-02 DIAGNOSIS — Z794 Long term (current) use of insulin: Secondary | ICD-10-CM

## 2020-06-02 DIAGNOSIS — E119 Type 2 diabetes mellitus without complications: Secondary | ICD-10-CM

## 2020-06-07 DIAGNOSIS — G5603 Carpal tunnel syndrome, bilateral upper limbs: Secondary | ICD-10-CM | POA: Diagnosis not present

## 2020-06-07 NOTE — Progress Notes (Signed)
These got forwarded to me , they may be a patient of yours. Ellie Lunch PA-C

## 2020-06-11 ENCOUNTER — Ambulatory Visit: Payer: BC Managed Care – PPO | Admitting: Family Medicine

## 2020-06-12 ENCOUNTER — Ambulatory Visit (INDEPENDENT_AMBULATORY_CARE_PROVIDER_SITE_OTHER): Payer: BC Managed Care – PPO

## 2020-06-12 ENCOUNTER — Other Ambulatory Visit: Payer: Self-pay

## 2020-06-12 VITALS — BP 135/79 | HR 88

## 2020-06-12 DIAGNOSIS — I10 Essential (primary) hypertension: Secondary | ICD-10-CM | POA: Diagnosis not present

## 2020-06-12 DIAGNOSIS — G5603 Carpal tunnel syndrome, bilateral upper limbs: Secondary | ICD-10-CM | POA: Diagnosis not present

## 2020-06-12 NOTE — Progress Notes (Signed)
Patient is here for a BP check due to bp being high at last visit, as per patient.  Currently patients BP is 158/90 and BPM is 68. Ten minutes later BP is 135/79 and BPM is 88. Patient has no complaints of headaches, blurry vision, chest pain, arm pain, light headedness, dizziness, and nor jaw pain. Please see previous note for order.

## 2020-06-20 ENCOUNTER — Other Ambulatory Visit: Payer: Self-pay

## 2020-06-22 ENCOUNTER — Ambulatory Visit: Payer: BC Managed Care – PPO | Admitting: Family Medicine

## 2020-07-20 ENCOUNTER — Telehealth: Payer: Self-pay | Admitting: Family Medicine

## 2020-07-20 ENCOUNTER — Encounter: Payer: Self-pay | Admitting: *Deleted

## 2020-07-20 DIAGNOSIS — E119 Type 2 diabetes mellitus without complications: Secondary | ICD-10-CM

## 2020-07-20 DIAGNOSIS — Z794 Long term (current) use of insulin: Secondary | ICD-10-CM

## 2020-07-20 MED ORDER — LANTUS SOLOSTAR 100 UNIT/ML ~~LOC~~ SOPN: PEN_INJECTOR | SUBCUTANEOUS | 3 refills | Status: DC

## 2020-07-20 NOTE — Telephone Encounter (Signed)
Refill sent.

## 2020-07-20 NOTE — Telephone Encounter (Signed)
Patient called her pharmacy to have her LANTUS SOLOSTAR 100 UNIT/ML Solostar Pen refilled. Pharmacy advised patient to call her provider because of no refills remaining.

## 2020-08-10 DIAGNOSIS — E669 Obesity, unspecified: Secondary | ICD-10-CM | POA: Diagnosis not present

## 2020-08-10 DIAGNOSIS — F32A Depression, unspecified: Secondary | ICD-10-CM | POA: Diagnosis not present

## 2020-08-10 DIAGNOSIS — Z794 Long term (current) use of insulin: Secondary | ICD-10-CM | POA: Diagnosis not present

## 2020-08-10 DIAGNOSIS — E7800 Pure hypercholesterolemia, unspecified: Secondary | ICD-10-CM | POA: Diagnosis not present

## 2020-08-10 DIAGNOSIS — F419 Anxiety disorder, unspecified: Secondary | ICD-10-CM | POA: Diagnosis not present

## 2020-08-10 DIAGNOSIS — E119 Type 2 diabetes mellitus without complications: Secondary | ICD-10-CM | POA: Diagnosis not present

## 2020-08-10 DIAGNOSIS — I10 Essential (primary) hypertension: Secondary | ICD-10-CM | POA: Diagnosis not present

## 2020-08-10 DIAGNOSIS — G5601 Carpal tunnel syndrome, right upper limb: Secondary | ICD-10-CM | POA: Diagnosis not present

## 2020-08-10 DIAGNOSIS — Z6839 Body mass index (BMI) 39.0-39.9, adult: Secondary | ICD-10-CM | POA: Diagnosis not present

## 2020-09-10 ENCOUNTER — Other Ambulatory Visit: Payer: Self-pay

## 2020-09-10 ENCOUNTER — Encounter: Payer: Self-pay | Admitting: Family Medicine

## 2020-09-10 ENCOUNTER — Ambulatory Visit (INDEPENDENT_AMBULATORY_CARE_PROVIDER_SITE_OTHER): Payer: BC Managed Care – PPO | Admitting: Family Medicine

## 2020-09-10 DIAGNOSIS — Z794 Long term (current) use of insulin: Secondary | ICD-10-CM

## 2020-09-10 DIAGNOSIS — G5601 Carpal tunnel syndrome, right upper limb: Secondary | ICD-10-CM | POA: Diagnosis not present

## 2020-09-10 DIAGNOSIS — F320 Major depressive disorder, single episode, mild: Secondary | ICD-10-CM | POA: Diagnosis not present

## 2020-09-10 DIAGNOSIS — M25561 Pain in right knee: Secondary | ICD-10-CM

## 2020-09-10 DIAGNOSIS — E119 Type 2 diabetes mellitus without complications: Secondary | ICD-10-CM | POA: Diagnosis not present

## 2020-09-10 DIAGNOSIS — I10 Essential (primary) hypertension: Secondary | ICD-10-CM | POA: Diagnosis not present

## 2020-09-10 DIAGNOSIS — R21 Rash and other nonspecific skin eruption: Secondary | ICD-10-CM

## 2020-09-10 DIAGNOSIS — W19XXXA Unspecified fall, initial encounter: Secondary | ICD-10-CM

## 2020-09-10 DIAGNOSIS — E785 Hyperlipidemia, unspecified: Secondary | ICD-10-CM

## 2020-09-10 MED ORDER — LISINOPRIL 10 MG PO TABS: 20.0000 mg | ORAL_TABLET | Freq: Every day | ORAL | 3 refills | Status: DC

## 2020-09-10 NOTE — Assessment & Plan Note (Signed)
Check lipid panel  

## 2020-09-10 NOTE — Assessment & Plan Note (Signed)
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  She will monitor and if this recurs she will let us know.  Discussed icing.  Exercises given.

## 2020-09-10 NOTE — Assessment & Plan Note (Addendum)
Uncontrolled.  Will increase her lisinopril to 20 mg once daily.  She will have labs in 7 to 10 days.  Follow-up in 1 month.

## 2020-09-10 NOTE — Assessment & Plan Note (Signed)
Minimal depression.  She will continue Zoloft 50 mg once daily.

## 2020-09-10 NOTE — Assessment & Plan Note (Signed)
Seems to have been a mechanical fall.  She reports she has recovered.  I advised against wearing crocs.

## 2020-09-10 NOTE — Assessment & Plan Note (Signed)
Very nonspecific rash.  If this recurs she will let us know and we will refer to dermatology.

## 2020-09-10 NOTE — Progress Notes (Signed)
Tommi Rumps, MD Phone: [phone]  Allison [name] is a 35 y.o. female who presents today for f/u.  DIABETES Disease Monitoring: Blood Sugar ranges->250 Polyuria/phagia/dipsia- no      Optho- due Medications: Compliance- taking trulicity, lantus Hypoglycemic symptoms- no  HYPERTENSION  Disease Monitoring  Home BP Monitoring not checking Chest pain- no    Dyspnea- no Medications  Compliance-  Taking amlodipine, lisinopril.   Edema- no  Depression: Minimal if any.  No SI.  She continues on Zoloft.  Carpal tunnel: She had right hand surgery.  She is no longer on pain medication.  She follows up with orthopedics on May 12.  She has had no numbness.  She does still have some wrist weakness though she is not supposed to be lifting more than 1 to 2 pounds at a time per her report.  Rash: She notes this has come on the dorsum of her right hand on 3 occasions.  It lasts for a day or so and then goes away with no intervention.  Last occurred 3 days ago.  No changes to soaps, detergents, or medications.  She showed me a picture of this on her phone and it is a faint red papular rash.  Right knee pain: Patient notes this has been bothering her intermittently when she stands up or goes up the stairs.  She has no pain today.  She wonders if it is related to her just laying around following having her hand surgery.  She notes no injury though she does report falling down the stairs in March because she was wearing crocs that got caught on the stairs.  She notes she bruised her right side though had no injury to her right knee.  She notes she has recovered from that fall.  She had no head injury or loss of consciousness.  Social History   Tobacco Use  Smoking Status Current Every Day Smoker  . Packs/day: 1.00  . Years: 10.00  . Pack years: 10.00  . Types: Cigarettes  . Start date: 05/14/2006  Smokeless Tobacco Never Used    Current Outpatient Medications on File Prior to Visit   Medication Sig Dispense Refill  . amLODipine (NORVASC) 10 MG tablet Take 1 tablet (10 mg total) by mouth daily. 90 tablet 1  . benzonatate (TESSALON PERLES) 100 MG capsule Take 2 capsules (200 mg total) by mouth 3 (three) times daily as needed for cough. 20 capsule 0  . blood glucose meter kit and supplies KIT Dispense based on patient and insurance preference. Use 3 times daily as directed. (FOR ICD-10 E11.9). 1 each 0  . Dextromethorphan-guaiFENesin 5-100 MG/5ML LIQD Take 10 mLs by mouth at bedtime as needed (for cough). 70 mL 0  . Dulaglutide (TRULICITY) 3 VE/7.2CN SOPN Inject 3 mg as directed once a week. 7 mL 1  . glucose blood test strip Use as instructed 100 each 11  . insulin glargine (LANTUS SOLOSTAR) 100 UNIT/ML Solostar Pen INJECT 22 UNITS SUBCUTANEOUSLY ONCE DAILY AT 10PM. 15 mL 3  . rosuvastatin (CRESTOR) 40 MG tablet Take 1 tablet (40 mg total) by mouth daily. 90 tablet 3  . sertraline (ZOLOFT) 50 MG tablet Take 1 tablet (50 mg total) by mouth daily. 30 tablet 3  . Vitamin D, Ergocalciferol, (DRISDOL) 1.25 MG (50000 UT) CAPS capsule Take 1 capsule (50,000 Units total) by mouth 2 (two) times a week. 30 capsule 2   No current facility-administered medications on file prior to visit.     ROS  see history of present illness  Objective  Physical Exam Vitals:   09/10/20 0946 09/10/20 1000  BP: 140/80 140/90  Pulse: 95   Temp: 98.3 F (36.8 C)   SpO2: 98%     BP Readings from Last 3 Encounters:  09/10/20 140/90  06/12/20 135/79  04/18/20 140/80   Wt Readings from Last 3 Encounters:  09/10/20 208 lb (94.3 kg)  05/30/20 204 lb (92.5 kg)  04/18/20 205 lb (93 kg)    Physical Exam Constitutional:      General: She is not in acute distress.    Appearance: She is not diaphoretic.  Cardiovascular:     Rate and Rhythm: Normal rate and regular rhythm.     Heart sounds: Normal heart sounds.  Pulmonary:     Effort: Pulmonary effort is normal.     Breath sounds: Normal  breath sounds.  Musculoskeletal:     Right lower leg: No edema.     Left lower leg: No edema.     Comments: Right knee with no tenderness or swelling, no ligamentous laxity, negative McMurray's  Skin:    General: Skin is warm and dry.  Neurological:     Mental Status: She is alert.      Assessment/Plan: Please see individual problem list.  Problem List Items Addressed This Visit    HLD (hyperlipidemia)    Check lipid panel.      Relevant Medications   lisinopril (ZESTRIL) 10 MG tablet   Other Relevant Orders   Comp Met (CMET)   Lipid panel   Type 2 diabetes mellitus without complication, with long-term current use of insulin (HCC)    Poorly controlled.  Discussed the need to get her diabetes under better control.  We will check an A1c.  She will continue her Trulicity 3 mg once weekly and Lantus 22 units once daily.  We will have her follow-up with our clinical pharmacist.      Relevant Medications   lisinopril (ZESTRIL) 10 MG tablet   Other Relevant Orders   Comp Met (CMET)   HgB A1c   AMB Referral to [facility] Coordinaton   Benign essential HTN    Uncontrolled.  Will increase her lisinopril to 20 mg once daily.  She will have labs in 7 to 10 days.  Follow-up in 1 month.      Relevant Medications   lisinopril (ZESTRIL) 10 MG tablet   Other Relevant Orders   Comp Met (CMET)   Rash    Very nonspecific rash.  If this recurs she will let us know and we will refer to dermatology.      Carpal tunnel syndrome    Reports doing well following surgery.  States she will see the orthopedist as planned.      Depression, major, single episode, mild (HCC)    Minimal depression.  She will continue Zoloft 50 mg once daily.      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  She will monitor and if this recurs she will let us know.  Discussed icing.  Exercises given.      Fall    Seems to have been a mechanical fall.  She reports she has  recovered.  I advised against wearing crocs.          Health Maintenance: She will call her gynecologist to schedule a Pap smear.  She was encouraged to see ophthalmology for her yearly exam.     This  visit occurred during the SARS-CoV-2 public health emergency.  Safety protocols were in place, including screening questions prior to the visit, additional usage of staff PPE, and extensive cleaning of exam room while observing appropriate contact time as indicated for disinfecting solutions.    Tommi Rumps, MD [place]

## 2020-09-10 NOTE — Patient Instructions (Addendum)
Nice to see you. Please do the exercises for your knee. If the rash returns please let us know. Please have your labs completed. Please call your gynecologist to schedule a Pap smear. We are going to increase your lisinopril to 20 mg once daily.  You should have your labs done 7 to 10 days from now.

## 2020-09-10 NOTE — Assessment & Plan Note (Signed)
Reports doing well following surgery.  States she will see the orthopedist as planned.

## 2020-09-10 NOTE — Assessment & Plan Note (Signed)
Poorly controlled.  Discussed the need to get her diabetes under better control.  We will check an A1c.  She will continue her Trulicity 3 mg once weekly and Lantus 22 units once daily.  We will have her follow-up with our clinical pharmacist.

## 2020-09-11 ENCOUNTER — Telehealth: Payer: Self-pay

## 2020-09-11 NOTE — Chronic Care Management (AMB) (Signed)
  Care Management   Note  09/11/2020 Name: Allison [name] MRN: [id number] DOB: 1985/06/21  Allison [name] is a 35 y.o. year old female who is a primary care patient of Birdie Sons, Yehuda Mao, MD. I reached out to [facility] by phone today in response to a referral sent by Ms. Allison Hunter Chillemi's PCP Glori Luis, MD .    [name] was given information about care management services today including:  1. Care management services include personalized support from designated clinical staff supervised by her physician, including individualized plan of care and coordination with other care providers 2. 24/7 contact phone numbers for assistance for urgent and routine care needs. 3. The patient may stop care management services at any time by phone call to the office staff.  Patient agreed to services and verbal consent obtained.   Follow up plan: Telephone appointment with care management team member scheduled for:10/05/2020  Penne Lash, RMA Care Guide, Embedded Care Coordination [facility]  [place], [place] [address] Direct Dial: [phone] [email or web address]: [email or web address]

## 2020-10-05 ENCOUNTER — Telehealth: Payer: BC Managed Care – PPO

## 2020-10-05 ENCOUNTER — Telehealth: Payer: Self-pay | Admitting: Pharmacist

## 2020-10-05 NOTE — Telephone Encounter (Signed)
  Chronic Care Management   Note  10/05/2020 Name: Allison [name] MRN: [id number] DOB: 1985/05/15   Attempted to contact patient for scheduled appointment for medication management support. Left HIPAA compliant message for patient to return my call at their convenience.    Patient also needs to schedule lab work per last PCP appointment. Will collaborate w/ office staff to outreach to schedule  Plan: - If I do not hear back from the patient by end of business today, will collaborate with Care Guide to outreach to schedule follow up with me  Catie Feliz Beam, PharmD, [place], CPP Clinical Pharmacist [facility] [employer] [phone]

## 2020-10-09 NOTE — Telephone Encounter (Signed)
I called and left a VM for the patient to get labs at [facility] before her appointment on 10/12/2020.  Brinley Rosete,cma

## 2020-10-10 ENCOUNTER — Telehealth: Payer: Self-pay

## 2020-10-10 ENCOUNTER — Other Ambulatory Visit: Payer: Self-pay

## 2020-10-10 ENCOUNTER — Telehealth: Payer: Self-pay | Admitting: Family Medicine

## 2020-10-10 NOTE — Telephone Encounter (Signed)
I called the patient and she wanted to get her labs done here and she is scheduled and also she moved her appointment so she can go over lab results. Ercil Cassis,cma

## 2020-10-10 NOTE — Telephone Encounter (Signed)
Patient called in stated that she wanted to have her labs done here from now on because where she is having it done they always have problems drawing it at the [facility] [facility] where she work

## 2020-10-10 NOTE — Chronic Care Management (AMB) (Signed)
  Care Management   Note  10/10/2020 Name: Allison [name] MRN: [id number] DOB: 09-May-1986  Allison [name] is a 35 y.o. year old female who is a primary care patient of Glori Luis, MD and is actively engaged with the care management team. I reached out to [facility] by phone today to assist with re-scheduling a follow up visit with the Pharmacist  Follow up plan: Unsuccessful telephone outreach attempt made. A HIPAA compliant phone message was left for the patient providing contact information and requesting a return call.  The care management team will reach out to the patient again over the next 7 days.  If patient returns call to provider office, please advise to call Embedded Care Management Care Guide Penne Lash  at [phone]  Penne Lash, RMA Care Guide, Embedded Care Coordination [facility]  [place], [place] [address] Direct Dial: [phone] [email or web address]

## 2020-10-10 NOTE — Addendum Note (Signed)
Addended by: Charlyne Mom D on: 10/10/2020 09:09 AM   Modules accepted: Orders

## 2020-10-11 NOTE — Addendum Note (Signed)
Addended by: Willard Farquharson S on: 10/11/2020 09:34 AM   Modules accepted: Orders  

## 2020-10-12 ENCOUNTER — Other Ambulatory Visit: Payer: Self-pay

## 2020-10-12 ENCOUNTER — Ambulatory Visit: Payer: BC Managed Care – PPO | Admitting: Family Medicine

## 2020-10-12 ENCOUNTER — Other Ambulatory Visit (INDEPENDENT_AMBULATORY_CARE_PROVIDER_SITE_OTHER): Payer: BC Managed Care – PPO

## 2020-10-12 DIAGNOSIS — E785 Hyperlipidemia, unspecified: Secondary | ICD-10-CM | POA: Diagnosis not present

## 2020-10-12 DIAGNOSIS — E119 Type 2 diabetes mellitus without complications: Secondary | ICD-10-CM

## 2020-10-12 DIAGNOSIS — I10 Essential (primary) hypertension: Secondary | ICD-10-CM

## 2020-10-12 DIAGNOSIS — Z794 Long term (current) use of insulin: Secondary | ICD-10-CM | POA: Diagnosis not present

## 2020-10-13 LAB — COMPREHENSIVE METABOLIC PANEL
ALT: 11 IU/L (ref 0–32)
AST: 8 IU/L (ref 0–40)
Albumin/Globulin Ratio: 1.7 (ref 1.2–2.2)
Albumin: 4.1 g/dL (ref 3.8–4.8)
Alkaline Phosphatase: 76 IU/L (ref 44–121)
BUN/Creatinine Ratio: 17 (ref 9–23)
BUN: 9 mg/dL (ref 6–20)
Bilirubin Total: <0.2 mg/dL (ref 0.0–1.2)
CO2: 24 mmol/L (ref 20–29)
Calcium: 8.9 mg/dL (ref 8.7–10.2)
Chloride: 101 mmol/L (ref 96–106)
Creatinine, Ser: 0.54 mg/dL — ABNORMAL LOW (ref 0.57–1.00)
Globulin, Total: 2.4 g/dL (ref 1.5–4.5)
Glucose: 228 mg/dL — ABNORMAL HIGH (ref 65–99)
Potassium: 4.2 mmol/L (ref 3.5–5.2)
Sodium: 136 mmol/L (ref 134–144)
Total Protein: 6.5 g/dL (ref 6.0–8.5)
eGFR: 123 mL/min/{1.73_m2} (ref >59)

## 2020-10-13 LAB — LIPID PANEL
Chol/HDL Ratio: 6.4 ratio — ABNORMAL HIGH (ref 0.0–4.4)
Cholesterol, Total: 140 mg/dL (ref 100–199)
HDL: 22 mg/dL — ABNORMAL LOW (ref >39)
LDL Chol Calc (NIH): 43 mg/dL (ref 0–99)
Triglycerides: 519 mg/dL — ABNORMAL HIGH (ref 0–149)
VLDL Cholesterol Cal: 75 mg/dL — ABNORMAL HIGH (ref 5–40)

## 2020-10-13 LAB — HEMOGLOBIN A1C
Est. average glucose Bld gHb Est-mCnc: 249 mg/dL
Hgb A1c MFr Bld: 10.3 % — ABNORMAL HIGH (ref 4.8–5.6)

## 2020-10-15 ENCOUNTER — Telehealth: Payer: Self-pay | Admitting: Family Medicine

## 2020-10-15 DIAGNOSIS — E119 Type 2 diabetes mellitus without complications: Secondary | ICD-10-CM

## 2020-10-15 DIAGNOSIS — Z794 Long term (current) use of insulin: Secondary | ICD-10-CM

## 2020-10-15 MED ORDER — LANTUS SOLOSTAR 100 UNIT/ML ~~LOC~~ SOPN: 40.0000 [IU] | PEN_INJECTOR | Freq: Every day | SUBCUTANEOUS | 2 refills | Status: DC

## 2020-10-15 NOTE — Telephone Encounter (Signed)
PT called in to advise that she is having issues with getting her insulin glargine (LANTUS SOLOSTAR) 100 UNIT/ML Solostar Pen refilled. They are being sent in for 22 units but she is running out every month and would like for it to be called in for 40 units that way she wont. PT also states she is currently out of them atm.

## 2020-10-15 NOTE — Telephone Encounter (Signed)
Ok to do so

## 2020-10-15 NOTE — Telephone Encounter (Signed)
Appropriate dose sent to pharmacy.

## 2020-10-15 NOTE — Addendum Note (Signed)
Addended by: Glori Luis on: 10/15/2020 04:38 PM   Modules accepted: Orders

## 2020-10-17 ENCOUNTER — Ambulatory Visit (INDEPENDENT_AMBULATORY_CARE_PROVIDER_SITE_OTHER): Payer: BC Managed Care – PPO | Admitting: Family Medicine

## 2020-10-17 ENCOUNTER — Encounter: Payer: Self-pay | Admitting: Family Medicine

## 2020-10-17 ENCOUNTER — Other Ambulatory Visit: Payer: Self-pay

## 2020-10-17 DIAGNOSIS — E119 Type 2 diabetes mellitus without complications: Secondary | ICD-10-CM | POA: Diagnosis not present

## 2020-10-17 DIAGNOSIS — Z6841 Body Mass Index (BMI) 40.0 and over, adult: Secondary | ICD-10-CM

## 2020-10-17 DIAGNOSIS — F320 Major depressive disorder, single episode, mild: Secondary | ICD-10-CM

## 2020-10-17 DIAGNOSIS — I10 Essential (primary) hypertension: Secondary | ICD-10-CM | POA: Diagnosis not present

## 2020-10-17 DIAGNOSIS — Z794 Long term (current) use of insulin: Secondary | ICD-10-CM

## 2020-10-17 DIAGNOSIS — J309 Allergic rhinitis, unspecified: Secondary | ICD-10-CM | POA: Insufficient documentation

## 2020-10-17 DIAGNOSIS — E785 Hyperlipidemia, unspecified: Secondary | ICD-10-CM

## 2020-10-17 DIAGNOSIS — J301 Allergic rhinitis due to pollen: Secondary | ICD-10-CM

## 2020-10-17 MED ORDER — FLUTICASONE PROPIONATE 50 MCG/ACT NA SUSP: 2.0000 | Freq: Every day | NASAL | 6 refills | Status: DC

## 2020-10-17 MED ORDER — BLOOD GLUCOSE MONITOR KIT: PACK | 0 refills | Status: DC

## 2020-10-17 MED ORDER — TRULICITY 4.5 MG/0.5ML ~~LOC~~ SOAJ: 4.5000 mg | SUBCUTANEOUS | 1 refills | Status: DC

## 2020-10-17 NOTE — Assessment & Plan Note (Signed)
Uncontrolled.  Discussed increasing lisinopril though she wants to work on exercise and see what it is at her next visit.  She will continue her current lisinopril and amlodipine doses.  Follow-up in 6 weeks.

## 2020-10-17 NOTE — Assessment & Plan Note (Signed)
She will trial Flonase.  I encouraged her to discontinue the Alka-Seltzer plus.

## 2020-10-17 NOTE — Patient Instructions (Addendum)
Nice to see you. We will increase your Trulicity to 4.5 mg once weekly.  Please continue with Lantus 40 units once daily.  Please start exercising as discussed. Please get a new glucometer. Please check your blood glucose 3 times daily. Please try to start checking your blood pressure.

## 2020-10-17 NOTE — Assessment & Plan Note (Signed)
Encouraged diet and exercise.  

## 2020-10-17 NOTE — Assessment & Plan Note (Signed)
Uncontrolled.  This is likely a combination of her diet as well as her not having her adequate amount of insulin.  Her insulin prescription has been rectified.  We will increase her Trulicity to 4.5 mg once weekly.  She will continue Lantus 40 units once daily.  She is going to start exercising at the gym and will be working on her diet as well.  Follow-up in 6 weeks.

## 2020-10-17 NOTE — Assessment & Plan Note (Signed)
The patient's LDL was well controlled.  Her triglycerides were elevated likely relating to this being a nonfasting test.  She will continue Crestor 40 mg once daily.  We will plan on rechecking her labs at her next visit fasting.

## 2020-10-17 NOTE — Assessment & Plan Note (Signed)
Asymptomatic.  She is off of Zoloft.  She will monitor for recurrence.

## 2020-10-17 NOTE — Progress Notes (Signed)
Tommi Rumps, MD Phone: [phone]  Allison [name] is a 35 y.o. female who presents today for f/u.  DIABETES Disease Monitoring: Blood Sugar ranges-280s Polyuria/phagia/dipsia- no      Optho- scheduled soon Medications: Compliance- taking trulicity 3 mg weekly, lantus 40 u daily though reports she was running out as her supply was running out early given the prescription was not correct Hypoglycemic symptoms- no  HYPERLIPIDEMIA Symptoms Chest pain on exertion:  no   Medications: Compliance- taking crestor Right upper quadrant pain- no  Muscle aches- no She was not fasting for her recent labs.   Allergies: Patient notes ongoing symptoms over the last month.  She has mild productive cough with sinus pressure and congestion.  Mild sneezing.  She has been using Alka-Seltzer.  Hypertension: The patient has not been checking at home.  She has been taking amlodipine 10 mg once daily and lisinopril 20 mg daily.  No chest pain.  Depression: Patient notes she tapered herself off of Zoloft.  She notes no recurrence of depression.   Social History   Tobacco Use  Smoking Status Current Every Day Smoker  . Packs/day: 1.00  . Years: 10.00  . Pack years: 10.00  . Types: Cigarettes  . Start date: 05/14/2006  Smokeless Tobacco Never Used    Current Outpatient Medications on File Prior to Visit  Medication Sig Dispense Refill  . amLODipine (NORVASC) 10 MG tablet Take 1 tablet (10 mg total) by mouth daily. 90 tablet 1  . benzonatate (TESSALON PERLES) 100 MG capsule Take 2 capsules (200 mg total) by mouth 3 (three) times daily as needed for cough. 20 capsule 0  . Dextromethorphan-guaiFENesin 5-100 MG/5ML LIQD Take 10 mLs by mouth at bedtime as needed (for cough). 70 mL 0  . glucose blood test strip Use as instructed 100 each 11  . insulin glargine (LANTUS SOLOSTAR) 100 UNIT/ML Solostar Pen Inject 40 Units into the skin at bedtime. 30 mL 2  . lisinopril (ZESTRIL) 10 MG tablet Take 2  tablets (20 mg total) by mouth daily. 180 tablet 3  . rosuvastatin (CRESTOR) 40 MG tablet Take 1 tablet (40 mg total) by mouth daily. 90 tablet 3  . Vitamin D, Ergocalciferol, (DRISDOL) 1.25 MG (50000 UT) CAPS capsule Take 1 capsule (50,000 Units total) by mouth 2 (two) times a week. 30 capsule 2  . ondansetron (ZOFRAN-ODT) 4 MG disintegrating tablet SMARTSIG:1 Tablet(s) By Mouth Every 12 Hours PRN    . oxyCODONE (OXY IR/ROXICODONE) 5 MG immediate release tablet Take 5 mg by mouth every 4 (four) hours.     No current facility-administered medications on file prior to visit.     ROS see history of present illness  Objective  Physical Exam Vitals:   10/17/20 1115  BP: 140/90  Pulse: (!) 102  Temp: 99.1 F (37.3 C)  SpO2: 99%    BP Readings from Last 3 Encounters:  10/17/20 140/90  09/10/20 140/90  06/12/20 135/79   Wt Readings from Last 3 Encounters:  10/17/20 207 lb (93.9 kg)  09/10/20 208 lb (94.3 kg)  05/30/20 204 lb (92.5 kg)    Physical Exam Constitutional:      General: She is not in acute distress.    Appearance: She is not diaphoretic.  Cardiovascular:     Rate and Rhythm: Normal rate and regular rhythm.     Heart sounds: Normal heart sounds.  Pulmonary:     Effort: Pulmonary effort is normal.     Breath sounds: Normal breath  sounds.  Musculoskeletal:     Right lower leg: No edema.     Left lower leg: No edema.  Skin:    General: Skin is warm and dry.  Neurological:     Mental Status: She is alert.      Assessment/Plan: Please see individual problem list.  Problem List Items Addressed This Visit    HLD (hyperlipidemia)    The patient's LDL was well controlled.  Her triglycerides were elevated likely relating to this being a nonfasting test.  She will continue Crestor 40 mg once daily.  We will plan on rechecking her labs at her next visit fasting.      Type 2 diabetes mellitus without complication, with long-term current use of insulin (HCC)     Uncontrolled.  This is likely a combination of her diet as well as her not having her adequate amount of insulin.  Her insulin prescription has been rectified.  We will increase her Trulicity to 4.5 mg once weekly.  She will continue Lantus 40 units once daily.  She is going to start exercising at the gym and will be working on her diet as well.  Follow-up in 6 weeks.      Relevant Medications   Dulaglutide (TRULICITY) 4.5 GU/5.4YH SOPN   blood glucose meter kit and supplies KIT   Benign essential HTN    Uncontrolled.  Discussed increasing lisinopril though she wants to work on exercise and see what it is at her next visit.  She will continue her current lisinopril and amlodipine doses.  Follow-up in 6 weeks.      Morbid obesity with BMI of 40.0-44.9, adult ([place])    Encouraged diet and exercise.      Relevant Medications   Dulaglutide (TRULICITY) 4.5 CW/2.3JS SOPN   Allergic rhinitis    She will trial Flonase.  I encouraged her to discontinue the Alka-Seltzer plus.      Relevant Medications   fluticasone (FLONASE) 50 MCG/ACT nasal spray   RESOLVED: Depression, major, single episode, mild (HCC)    Asymptomatic.  She is off of Zoloft.  She will monitor for recurrence.        Return in about 6 weeks (around 11/28/2020) for diabetes, htn.  This visit occurred during the SARS-CoV-2 public health emergency.  Safety protocols were in place, including screening questions prior to the visit, additional usage of staff PPE, and extensive cleaning of exam room while observing appropriate contact time as indicated for disinfecting solutions.    Tommi Rumps, MD [place]

## 2020-10-18 NOTE — Chronic Care Management (AMB) (Signed)
  Care Management   Note  10/18/2020 Name: Allison [name] MRN: [id number] DOB: [birth year]  Allison [name] is a 35 y.o. year old female who is a primary care patient of Glori Luis, MD and is actively engaged with the care management team. I reached out to [facility] by phone today to assist with re-scheduling a follow up visit with the Pharmacist  Follow up plan: Unsuccessful telephone outreach attempt made. A HIPAA compliant phone message was left for the patient providing contact information and requesting a return call.  The care management team will reach out to the patient again over the next 7 days.  If patient returns call to provider office, please advise to call Embedded Care Management Care Guide Penne Lash at [phone]  Penne Lash, RMA Care Guide, Embedded Care Coordination [facility]  [place], [place] [address] Direct Dial: [phone] [email or web address]

## 2020-10-31 NOTE — Telephone Encounter (Signed)
3rd unsuccessful outreach  

## 2020-10-31 NOTE — Chronic Care Management (AMB) (Signed)
  Care Management   Note  10/31/2020 Name: Allison [name] MRN: [id number] DOB: 12-Oct-1985  Hillery Hunter Neisler is a 35 y.o. year old female who is a primary care patient of Glori Luis, MD and is actively engaged with the care management team. I reached out to [facility] by phone today to assist with re-scheduling a follow up visit with the Pharmacist  Follow up plan: Unable to make contact on outreach attempts x 2. PCP Glori Luis, MD notified via routed documentation in medical record.   Penne Lash, RMA Care Guide, Embedded Care Coordination [facility]  [place], [place] [address] Direct Dial: [phone] [email or web address]

## 2020-11-01 LAB — HM DIABETES EYE EXAM

## 2020-11-30 NOTE — Progress Notes (Signed)
Hi Heather,  No, she is not one of ours.  Toniann Fail

## 2020-12-06 ENCOUNTER — Encounter: Payer: Self-pay | Admitting: Family Medicine

## 2020-12-06 ENCOUNTER — Ambulatory Visit (INDEPENDENT_AMBULATORY_CARE_PROVIDER_SITE_OTHER): Payer: BC Managed Care – PPO | Admitting: Family Medicine

## 2020-12-06 ENCOUNTER — Other Ambulatory Visit: Payer: Self-pay

## 2020-12-06 VITALS — BP 130/80 | Temp 98.0°F | Ht 62.01 in | Wt 201.8 lb

## 2020-12-06 DIAGNOSIS — E119 Type 2 diabetes mellitus without complications: Secondary | ICD-10-CM

## 2020-12-06 DIAGNOSIS — I10 Essential (primary) hypertension: Secondary | ICD-10-CM

## 2020-12-06 DIAGNOSIS — F419 Anxiety disorder, unspecified: Secondary | ICD-10-CM | POA: Insufficient documentation

## 2020-12-06 DIAGNOSIS — F32A Depression, unspecified: Secondary | ICD-10-CM | POA: Insufficient documentation

## 2020-12-06 DIAGNOSIS — K2970 Gastritis, unspecified, without bleeding: Secondary | ICD-10-CM | POA: Insufficient documentation

## 2020-12-06 DIAGNOSIS — F320 Major depressive disorder, single episode, mild: Secondary | ICD-10-CM

## 2020-12-06 DIAGNOSIS — R519 Headache, unspecified: Secondary | ICD-10-CM

## 2020-12-06 DIAGNOSIS — K2900 Acute gastritis without bleeding: Secondary | ICD-10-CM

## 2020-12-06 DIAGNOSIS — G8929 Other chronic pain: Secondary | ICD-10-CM

## 2020-12-06 DIAGNOSIS — Z794 Long term (current) use of insulin: Secondary | ICD-10-CM

## 2020-12-06 MED ORDER — SERTRALINE HCL 50 MG PO TABS: 50.0000 mg | ORAL_TABLET | Freq: Every day | ORAL | 3 refills | Status: DC

## 2020-12-06 MED ORDER — OMEPRAZOLE 20 MG PO CPDR: 20.0000 mg | DELAYED_RELEASE_CAPSULE | Freq: Every day | ORAL | 0 refills | Status: DC

## 2020-12-06 NOTE — Progress Notes (Signed)
Tommi Rumps, MD Phone: [phone]  Allison [name] is a 35 y.o. female who presents today for follow-up.  Diabetes: Notes her sugars have been into the lower 100s recently.  She occasionally has an excursion to the 210s.  She is not checking every day.  She is on Trulicity 4.5 mg weekly and Lantus 40 units daily.  She does note continued polydipsia.  She had one episode where she felt as though her sugar was low.  She notes she saw ophthalmology a month ago.  She does report some nausea.  Hypertension: Not checking at home.  Taking lisinopril 20 mg once daily and amlodipine 10 mg once daily.  No chest pain, shortness of breath, or edema.  She has been working on cutting her food volumes down and has cut down on sweet intake.  She has been exercising 3 times a week with walking and sit ups.  Depression: She started back on Zoloft.  She had come off of this previously and felt as though she was doing well though notes it got pretty bad for a while while being off the medication.  She did endorse some suicidal ideation when she was off the medicine though that has resolved now.  She feels as though things have improved quite a bit.  She notes no current suicidal ideation.  Chronic headaches: These have been going on for years.  She notes they occur on the top of her head and are described as a throbbing discomfort.  They do not occur daily.  She has been taking lots of BC powders for this.  She has been taking those intermittently for years and notes she typically takes 4 to 5/day.  No numbness, weakness, vision changes, photophobia, or phonophobia.  She does note some stomach burning sensation when she takes a BCs.  Social History   Tobacco Use  Smoking Status Every Day   Packs/day: 1.00   Years: 10.00   Pack years: 10.00   Types: Cigarettes   Start date: 05/14/2006  Smokeless Tobacco Never    Current Outpatient Medications on File Prior to Visit  Medication Sig Dispense Refill    amLODipine (NORVASC) 10 MG tablet Take 1 tablet (10 mg total) by mouth daily. 90 tablet 1   benzonatate (TESSALON PERLES) 100 MG capsule Take 2 capsules (200 mg total) by mouth 3 (three) times daily as needed for cough. 20 capsule 0   blood glucose meter kit and supplies KIT Dispense based on patient and insurance preference. Use 3 times daily as directed. (FOR ICD-10 E11.9). 1 each 0   Dextromethorphan-guaiFENesin 5-100 MG/5ML LIQD Take 10 mLs by mouth at bedtime as needed (for cough). 70 mL 0   Dulaglutide (TRULICITY) 4.5 FU/9.3AT SOPN Inject 4.5 mg as directed once a week. 6 mL 1   fluticasone (FLONASE) 50 MCG/ACT nasal spray Place 2 sprays into both nostrils daily. 16 g 6   glucose blood test strip Use as instructed 100 each 11   insulin glargine (LANTUS SOLOSTAR) 100 UNIT/ML Solostar Pen Inject 40 Units into the skin at bedtime. 30 mL 2   lisinopril (ZESTRIL) 10 MG tablet Take 2 tablets (20 mg total) by mouth daily. 180 tablet 3   ondansetron (ZOFRAN-ODT) 4 MG disintegrating tablet SMARTSIG:1 Tablet(s) By Mouth Every 12 Hours PRN     oxyCODONE (OXY IR/ROXICODONE) 5 MG immediate release tablet Take 5 mg by mouth every 4 (four) hours.     rosuvastatin (CRESTOR) 40 MG tablet Take 1 tablet (40 mg  total) by mouth daily. 90 tablet 3   Vitamin D, Ergocalciferol, (DRISDOL) 1.25 MG (50000 UT) CAPS capsule Take 1 capsule (50,000 Units total) by mouth 2 (two) times a week. 30 capsule 2   No current facility-administered medications on file prior to visit.     ROS see history of present illness  Objective  Physical Exam Vitals:   12/06/20 0818  BP: 130/80  Temp: 98 F (36.7 C)    BP Readings from Last 3 Encounters:  12/06/20 130/80  10/17/20 140/90  09/10/20 140/90   Wt Readings from Last 3 Encounters:  12/06/20 201 lb 12.8 oz (91.5 kg)  10/17/20 207 lb (93.9 kg)  09/10/20 208 lb (94.3 kg)    Physical Exam Constitutional:      General: She is not in acute distress.    Appearance:  She is not diaphoretic.  Cardiovascular:     Rate and Rhythm: Normal rate and regular rhythm.     Heart sounds: Normal heart sounds.  Pulmonary:     Effort: Pulmonary effort is normal.     Breath sounds: Normal breath sounds.  Abdominal:     General: Bowel sounds are normal. There is no distension.     Palpations: Abdomen is soft.     Tenderness: no abdominal tenderness There is no guarding or rebound.  Skin:    General: Skin is warm and dry.  Neurological:     Mental Status: She is alert.     Comments: EOMI, PERRL, V1 through V3 intact bilaterally to light touch, hearing intact to finger rub, shoulder shrug intact, opens and closes eyes adequately, 5/5 strength in bilateral biceps, triceps, grip, quads, hamstrings, plantar and dorsiflexion, sensation to light touch intact in bilateral UE and LE, normal gait, 2+ patellar reflexes      Assessment/Plan: Please see individual problem list.  Problem List Items Addressed This Visit     Benign essential HTN - Primary    Improved.  I encouraged her to continue with diet and exercise changes.  She will continue lisinopril and amlodipine.       Chronic headaches    She certainly has some tension component though there is likely medication overuse headache as well.  Discussed tapering off of the [facility] powders by cutting her dose in half each week for 3 weeks and then discontinuing.  Discussed there are other options for treatment of chronic headaches and we could pursue those once she is off of BC powders.       Relevant Medications   sertraline (ZOLOFT) 50 MG tablet   Depression, major, single episode, mild (HCC)    Recurrent issues with this.  She will remain on Zoloft 50 mg once daily.  Discussed that if she ever develop suicidal ideation again she needs to be evaluated.       Relevant Medications   sertraline (ZOLOFT) 50 MG tablet   Gastritis    Discussed she likely has gastritis related to overuse of BC powders.  Discussed that  her nausea certainly could be related to this.  She was advised to discontinue the [facility] powders and she could consider tapering down on them given her headaches.  We will start her on omeprazole 20 mg once daily 30 minutes before breakfast.  If there is no improvement in her nausea with treatment of the gastritis we could consider altering her Trulicity.       Relevant Medications   omeprazole (PRILOSEC) 20 MG capsule   Type 2 diabetes mellitus  without complication, with long-term current use of insulin ([place])    Home readings seem to be improved.  I encouraged her to check her fasting sugars daily and to try to check some 2-hour postprandial sugars.  She will continue Trulicity and Lantus at this time.         Health Maintenance: She will work on getting into see GYN for her Pap smear.  Return in about 6 weeks (around 01/17/2021) for Diabetes, chronic headaches.  This visit occurred during the SARS-CoV-2 public health emergency.  Safety protocols were in place, including screening questions prior to the visit, additional usage of staff PPE, and extensive cleaning of exam room while observing appropriate contact time as indicated for disinfecting solutions.    Tommi Rumps, MD [place]

## 2020-12-06 NOTE — Patient Instructions (Signed)
Nice to see you. I have refilled her Zoloft. Please stop the [facility] powders.  We will start her on omeprazole to help with the lining of your stomach. Please start checking your glucose fasting each day.  If you can check it 2 hours after you eat at times that would be great. Please continue with your diet and exercise changes.

## 2020-12-06 NOTE — Assessment & Plan Note (Signed)
Recurrent issues with this.  She will remain on Zoloft 50 mg once daily.  Discussed that if she ever develop suicidal ideation again she needs to be evaluated.

## 2020-12-06 NOTE — Assessment & Plan Note (Signed)
Improved.  I encouraged her to continue with diet and exercise changes.  She will continue lisinopril and amlodipine.

## 2020-12-06 NOTE — Assessment & Plan Note (Signed)
She certainly has some tension component though there is likely medication overuse headache as well.  Discussed tapering off of the [facility] powders by cutting her dose in half each week for 3 weeks and then discontinuing.  Discussed there are other options for treatment of chronic headaches and we could pursue those once she is off of BC powders.

## 2020-12-06 NOTE — Assessment & Plan Note (Addendum)
Discussed she likely has gastritis related to overuse of BC powders.  Discussed that her nausea certainly could be related to this.  She was advised to discontinue the [facility] powders and she could consider tapering down on them given her headaches.  We will start her on omeprazole 20 mg once daily 30 minutes before breakfast.  If there is no improvement in her nausea with treatment of the gastritis we could consider altering her Trulicity.

## 2020-12-06 NOTE — Assessment & Plan Note (Signed)
Home readings seem to be improved.  I encouraged her to check her fasting sugars daily and to try to check some 2-hour postprandial sugars.  She will continue Trulicity and Lantus at this time.

## 2020-12-07 ENCOUNTER — Ambulatory Visit: Payer: BC Managed Care – PPO | Admitting: Family Medicine

## 2021-01-21 ENCOUNTER — Ambulatory Visit (INDEPENDENT_AMBULATORY_CARE_PROVIDER_SITE_OTHER): Payer: BC Managed Care – PPO | Admitting: Family Medicine

## 2021-01-21 ENCOUNTER — Other Ambulatory Visit: Payer: Self-pay

## 2021-01-21 ENCOUNTER — Encounter: Payer: Self-pay | Admitting: Family Medicine

## 2021-01-21 VITALS — BP 130/86 | HR 88 | Ht 62.01 in | Wt 203.8 lb

## 2021-01-21 DIAGNOSIS — E119 Type 2 diabetes mellitus without complications: Secondary | ICD-10-CM

## 2021-01-21 DIAGNOSIS — R519 Headache, unspecified: Secondary | ICD-10-CM | POA: Diagnosis not present

## 2021-01-21 DIAGNOSIS — Z794 Long term (current) use of insulin: Secondary | ICD-10-CM

## 2021-01-21 DIAGNOSIS — R21 Rash and other nonspecific skin eruption: Secondary | ICD-10-CM | POA: Diagnosis not present

## 2021-01-21 DIAGNOSIS — R197 Diarrhea, unspecified: Secondary | ICD-10-CM | POA: Insufficient documentation

## 2021-01-21 DIAGNOSIS — I10 Essential (primary) hypertension: Secondary | ICD-10-CM

## 2021-01-21 DIAGNOSIS — G8929 Other chronic pain: Secondary | ICD-10-CM

## 2021-01-21 LAB — POCT GLYCOSYLATED HEMOGLOBIN (HGB A1C): Hemoglobin A1C: 7.9 % — AB (ref 4.0–5.6)

## 2021-01-21 MED ORDER — TIRZEPATIDE 2.5 MG/0.5ML ~~LOC~~ SOAJ: 2.5000 mg | SUBCUTANEOUS | 0 refills | Status: AC

## 2021-01-21 MED ORDER — LISINOPRIL 40 MG PO TABS: 40.0000 mg | ORAL_TABLET | Freq: Every day | ORAL | 1 refills | Status: DC

## 2021-01-21 MED ORDER — TIRZEPATIDE 5 MG/0.5ML ~~LOC~~ SOAJ: 5.0000 mg | SUBCUTANEOUS | 0 refills | Status: DC

## 2021-01-21 MED ORDER — ADAPALENE 0.1 % EX CREA: TOPICAL_CREAM | Freq: Every day | CUTANEOUS | 0 refills | Status: DC

## 2021-01-21 NOTE — Progress Notes (Signed)
Tommi Rumps, MD Phone: [phone]  Allison [name] is a 35 y.o. female who presents today for f/u.  DIABETES Disease Monitoring: Blood Sugar ranges-171 this morning about 2/5 hours after eating Polyuria/phagia/dipsia- no      Optho- UTD, reports she saw them in July Medications: Compliance- taking trulicity 4.5 mg weekly, lantus 40 u nightly Hypoglycemic symptoms- no She denies personal and family history of thyroid cancer, parathyroid cancer, and adrenal gland cancer.  HYPERTENSION Disease Monitoring Home BP Monitoring not checking Chest pain- no    Dyspnea- no Medications Compliance-  taking amlodipine, lisinopril.  Edema- no BMET    Component Value Date/Time   NA 136 10/12/2020 1500   NA 138 01/24/2012 1902   K 4.2 10/12/2020 1500   K 4.8 01/24/2012 1902   CL 101 10/12/2020 1500   CL 106 01/24/2012 1902   CO2 24 10/12/2020 1500   CO2 24 01/24/2012 1902   GLUCOSE 228 (H) 10/12/2020 1500   GLUCOSE 109 (H) 03/30/2012 1035   BUN 9 10/12/2020 1500   BUN 6 (L) 01/24/2012 1902   CREATININE 0.54 (L) 10/12/2020 1500   CREATININE 0.62 01/24/2012 1902   CALCIUM 8.9 10/12/2020 1500   CALCIUM 8.6 01/24/2012 1902   GFRNONAA 113 05/22/2020 0932   GFRNONAA >60 01/24/2012 1902   GFRAA 131 05/22/2020 0932   GFRAA >60 01/24/2012 1902   Chronic headaches: Patient notes these have improved with tapering off of the BC powders.  She has a headache once or twice a week and attributes it to lack of sleep given that she works third shift and has an altered sleep schedule most days.  She also notes she is not drinking enough water.  Stomach burning sensation: Patient notes no issues with this.  She has been on the omeprazole for about a month.  Since starting on the omeprazole she has had diarrhea 1-2 times a week.  No blood in her stool.  No abdominal pain.  Facial rash: Patient notes about a month ago she developed a couple of spots that she thought were pimples.  She tried a cream  on it and there was no benefit.  She notes the first 2 spots did not drain anything though she developed a spot on her upper external lip that has drained some clear material.  She notes the lesions are painful when they occur.  No itching.  She notes no fevers.  No exposures.  No travel.  No rash elsewhere on her body.  No history of this previously though she does have a history of acne.  No changes in soaps or detergents.   Social History   Tobacco Use  Smoking Status Every Day   Packs/day: 1.00   Years: 10.00   Pack years: 10.00   Types: Cigarettes   Start date: 05/14/2006  Smokeless Tobacco Never    Current Outpatient Medications on File Prior to Visit  Medication Sig Dispense Refill   amLODipine (NORVASC) 10 MG tablet Take 1 tablet (10 mg total) by mouth daily. 90 tablet 1   blood glucose meter kit and supplies KIT Dispense based on patient and insurance preference. Use 3 times daily as directed. (FOR ICD-10 E11.9). 1 each 0   Dextromethorphan-guaiFENesin 5-100 MG/5ML LIQD Take 10 mLs by mouth at bedtime as needed (for cough). 70 mL 0   fluticasone (FLONASE) 50 MCG/ACT nasal spray Place 2 sprays into both nostrils daily. 16 g 6   glucose blood test strip Use as instructed 100 each 11  insulin glargine (LANTUS SOLOSTAR) 100 UNIT/ML Solostar Pen Inject 40 Units into the skin at bedtime. 30 mL 2   omeprazole (PRILOSEC) 20 MG capsule Take 1 capsule (20 mg total) by mouth daily. 30 capsule 0   rosuvastatin (CRESTOR) 40 MG tablet Take 1 tablet (40 mg total) by mouth daily. 90 tablet 3   sertraline (ZOLOFT) 50 MG tablet Take 1 tablet (50 mg total) by mouth daily. 30 tablet 3   Vitamin D, Ergocalciferol, (DRISDOL) 1.25 MG (50000 UT) CAPS capsule Take 1 capsule (50,000 Units total) by mouth 2 (two) times a week. 30 capsule 2   ondansetron (ZOFRAN-ODT) 4 MG disintegrating tablet SMARTSIG:1 Tablet(s) By Mouth Every 12 Hours PRN (Patient not taking: Reported on 01/21/2021)     oxyCODONE (OXY  IR/ROXICODONE) 5 MG immediate release tablet Take 5 mg by mouth every 4 (four) hours. (Patient not taking: Reported on 01/21/2021)     No current facility-administered medications on file prior to visit.     ROS see history of present illness  Objective  Physical Exam Vitals:   01/21/21 0844  BP: 130/86  Pulse: 88  SpO2: 98%    BP Readings from Last 3 Encounters:  01/21/21 130/86  12/06/20 130/80  10/17/20 140/90   Wt Readings from Last 3 Encounters:  01/21/21 203 lb 12.8 oz (92.4 kg)  12/06/20 201 lb 12.8 oz (91.5 kg)  10/17/20 207 lb (93.9 kg)    Physical Exam Constitutional:      General: She is not in acute distress.    Appearance: She is not diaphoretic.  HENT:     Head:   Neck:   Cardiovascular:     Rate and Rhythm: Normal rate and regular rhythm.     Heart sounds: Normal heart sounds.  Pulmonary:     Effort: Pulmonary effort is normal.     Breath sounds: Normal breath sounds.  Skin:    General: Skin is warm and dry.  Neurological:     Mental Status: She is alert.     Assessment/Plan: Please see individual problem list.  Problem List Items Addressed This Visit     Benign essential HTN - Primary    Blood pressure is elevated.  We are going to increase her lisinopril to 40 mg once daily.  She will return in 1 week for lab work.  She will continue amlodipine 10 mg daily.  Follow-up in 1 month for blood pressure recheck.  She was advised to monitor for onset of cough with her lisinopril.  The patient has had a tubal ligation.      Relevant Medications   lisinopril (ZESTRIL) 40 MG tablet   Other Relevant Orders   Basic Metabolic Panel (BMET)   Chronic headaches    Improved.  I suspect sleep habits and inadequate hydration are playing a role.  I encouraged her to get more sleep.  Discussed drinking more water.  Discussed she could take Tylenol once a week to help with headaches if needed.      Diarrhea    Possibly related to the omeprazole.  She will  discontinue this and see if this improves.  If not improving she will let us know.      Rash    Possibly acne related.  She only has 3 lesions on her face.  We will trial Differin cream to see if this makes a difference.  If not improving or she develops a rash elsewhere she will let us know.  Relevant Medications   adapalene (DIFFERIN) 0.1 % cream   Type 2 diabetes mellitus without complication, with long-term current use of insulin (HCC)    Her A1c is much improved though still not at goal.  We will switch her from Trulicity to [facility].  She will start with the 2.5 mg dose for 4 weeks and then go to the 5 mg dose.  She was advised to contact us for any abdominal pain or nausea that she has with this.  Discussed that the risk of thyroid cancer seen in rats studies is similar with this type of medication to the Trulicity.      Relevant Medications   lisinopril (ZESTRIL) 40 MG tablet   tirzepatide (MOUNJARO) 2.5 MG/0.5ML Pen (Start on 01/28/2021)   tirzepatide [facility]) 5 MG/0.5ML Pen (Start on 02/25/2021)   Other Relevant Orders   POCT HgB A1C (Completed)     Health Maintenance: she was encouraged to contact GYN for her pap smear.   Return in about 1 week (around 01/28/2021) for Labs, 4 to 6 weeks with PCP for hypertension, diabetes follow-up.  This visit occurred during the SARS-CoV-2 public health emergency.  Safety protocols were in place, including screening questions prior to the visit, additional usage of staff PPE, and extensive cleaning of exam room while observing appropriate contact time as indicated for disinfecting solutions.    Tommi Rumps, MD [place]

## 2021-01-21 NOTE — Addendum Note (Signed)
Addended by: Warden Fillers on: 01/21/2021 12:20 PM   Modules accepted: Orders

## 2021-01-21 NOTE — Assessment & Plan Note (Addendum)
Blood pressure is elevated.  We are going to increase her lisinopril to 40 mg once daily.  She will return in 1 week for lab work.  She will continue amlodipine 10 mg daily.  Follow-up in 1 month for blood pressure recheck.  She was advised to monitor for onset of cough with her lisinopril.  The patient has had a tubal ligation.

## 2021-01-21 NOTE — Assessment & Plan Note (Signed)
Possibly acne related.  She only has 3 lesions on her face.  We will trial Differin cream to see if this makes a difference.  If not improving or she develops a rash elsewhere she will let us know.

## 2021-01-21 NOTE — Patient Instructions (Signed)
Nice to see you. We are going to try Differin cream to see if that helps with your rash. We will start you on mounjaro for your diabetes.  You will start this 1 week after your last dose of the Trulicity.  You will do the 2.5 mg dose for a total of 4 weeks and then start on the 5 mg dose.  If you develop any abdominal pain, nausea, or any other issues while taking this please let us know. We are increasing your lisinopril to 40 mg once daily.

## 2021-01-21 NOTE — Assessment & Plan Note (Signed)
Her A1c is much improved though still not at goal.  We will switch her from Trulicity to [facility].  She will start with the 2.5 mg dose for 4 weeks and then go to the 5 mg dose.  She was advised to contact us for any abdominal pain or nausea that she has with this.  Discussed that the risk of thyroid cancer seen in rats studies is similar with this type of medication to the Trulicity.

## 2021-01-21 NOTE — Assessment & Plan Note (Signed)
Possibly related to the omeprazole.  She will discontinue this and see if this improves.  If not improving she will let us know.

## 2021-01-21 NOTE — Assessment & Plan Note (Signed)
Improved.  I suspect sleep habits and inadequate hydration are playing a role.  I encouraged her to get more sleep.  Discussed drinking more water.  Discussed she could take Tylenol once a week to help with headaches if needed.

## 2021-01-24 ENCOUNTER — Telehealth: Payer: Self-pay | Admitting: Pharmacist

## 2021-01-24 NOTE — Telephone Encounter (Signed)
PA submitted for Mounjaro 5 mg via Cover My Meds (Key [id number]). PA asked if patient had tried and failed "all of the following preferred options: Trulicity, Ozempic, Rybelsus, and Victoza". Submitted trial and failure of Trulicity and history of metformin.   If PA denied, will plan to submit appeal with documentation arguing for benefit of Mounjaro over remaining preferred options (Ozempic, Rybelsus, Victoza) due to clinical trial results demonstrating superior glycemic and weight benefit for Mounjaro.

## 2021-01-28 ENCOUNTER — Other Ambulatory Visit: Payer: BC Managed Care – PPO

## 2021-01-29 NOTE — Telephone Encounter (Signed)
PA for [facility] denied, likely because of the before noted reasons that she has not tried and failed all covered alternatives. However, the manufacturer savings card for [facility] should still cover it for $25 for a year.   Or, an appeal can be attempted. Let me know if you want me to attempt to work with patient again to help with medication access.

## 2021-01-30 NOTE — Telephone Encounter (Signed)
I think it would be good to attempt the appeal with the reasons you listed in your initial message. Please let me know if I need to do anything.

## 2021-02-01 NOTE — Telephone Encounter (Signed)
PA denial letter with instructions for how to complete appeal was sent to scan. Will wait until it appears in the media tab to start appeal

## 2021-02-04 NOTE — Telephone Encounter (Signed)
Called [employer] and asked that they resend the PA denial letter.   Coralee [place] - could you hold this for me when it's received?

## 2021-02-05 ENCOUNTER — Other Ambulatory Visit: Payer: Self-pay

## 2021-02-05 ENCOUNTER — Other Ambulatory Visit (INDEPENDENT_AMBULATORY_CARE_PROVIDER_SITE_OTHER): Payer: BC Managed Care – PPO

## 2021-02-05 DIAGNOSIS — I10 Essential (primary) hypertension: Secondary | ICD-10-CM

## 2021-02-05 LAB — BASIC METABOLIC PANEL
BUN: 15 mg/dL (ref 6–23)
CO2: 27 mEq/L (ref 19–32)
Calcium: 9.3 mg/dL (ref 8.4–10.5)
Chloride: 103 mEq/L (ref 96–112)
Creatinine, Ser: 0.78 mg/dL (ref 0.40–1.20)
GFR: 98.28 mL/min (ref >60.00)
Glucose, Bld: 161 mg/dL — ABNORMAL HIGH (ref 70–99)
Potassium: 4 mEq/L (ref 3.5–5.1)
Sodium: 138 mEq/L (ref 135–145)

## 2021-02-06 ENCOUNTER — Ambulatory Visit (INDEPENDENT_AMBULATORY_CARE_PROVIDER_SITE_OTHER): Payer: BC Managed Care – PPO | Admitting: Family Medicine

## 2021-02-06 VITALS — BP 150/90 | HR 78 | Temp 96.0°F | Ht 62.01 in | Wt 203.1 lb

## 2021-02-06 DIAGNOSIS — M79642 Pain in left hand: Secondary | ICD-10-CM | POA: Diagnosis not present

## 2021-02-06 DIAGNOSIS — Z794 Long term (current) use of insulin: Secondary | ICD-10-CM

## 2021-02-06 DIAGNOSIS — E119 Type 2 diabetes mellitus without complications: Secondary | ICD-10-CM | POA: Diagnosis not present

## 2021-02-06 MED ORDER — MELOXICAM 15 MG PO TABS: 15.0000 mg | ORAL_TABLET | Freq: Every day | ORAL | 0 refills | Status: DC

## 2021-02-06 NOTE — Assessment & Plan Note (Signed)
Lab Results  Component Value Date   HGBA1C 7.9 (A) 01/21/2021   Discussed steroids as an option given lack of improvement with OTC pain relievers with but DM would like to avoid. Anticipate if pain not improved would plan for temporary increase in lantus if needing steroids.

## 2021-02-06 NOTE — Assessment & Plan Note (Signed)
Pt with hx of carpal tunnel but now with whole hand swelling and numbness. Overall reassuring exam w/o bony tenderness or impacts in function. Suspect some of her symptoms are 2/2 to swelling and will focus on NSAIDs and rest/elevation/bracing as tolerated. Hx of carpal tunnel in that hand so this could be contributing. Advised ortho f/u or call back if no improvement to consider course of steroids. If swelling worsens may need to consider blood clot but with localized to the hand do not suspect that at this time.

## 2021-02-06 NOTE — Patient Instructions (Addendum)
Hand pain - meloxicam - call if not better could try course of steroids - though this will raise your blood sugar - Consider reaching out to surgeon - for appointment next week if not better   Out of work for 2 days

## 2021-02-06 NOTE — Progress Notes (Signed)
Subjective:     [facility] Allison [name] is a 35 y.o. female presenting for Hand Pain (Tingles and numb. Brace makes it worse. Started on Saturday )     HPI  #Left hand numbness - hx of carpal tunnel on the Right s/p surgery - is getting whole hand numbness - when she bends her hand will get a pain in the 4th digit and it will travel up the arm - was told she had carpal tunnel in the left side as well - has not had significant pain/numbness until Saturday - has been using her brace but this seems to make it worse - had been taking tylenol/ibuprofen  - then tried [facility] arthritis w/ improvement - took arthritis tylenol w/o improvement - endorses some tightness  Has not seen ortho since surgery - [facility]  #HTN - taking medications - is in pain today   Review of Systems   Social History   Tobacco Use  Smoking Status Every Day   Packs/day: 1.00   Years: 10.00   Pack years: 10.00   Types: Cigarettes   Start date: 05/14/2006  Smokeless Tobacco Never        Objective:    BP Readings from Last 3 Encounters:  02/06/21 (!) 150/90  01/21/21 130/86  12/06/20 130/80   Wt Readings from Last 3 Encounters:  02/06/21 203 lb 2 oz (92.1 kg)  01/21/21 203 lb 12.8 oz (92.4 kg)  12/06/20 201 lb 12.8 oz (91.5 kg)    BP (!) 150/90   Pulse 78   Temp (!) 96 F (35.6 C) (Temporal)   Ht 5' 2.01" (1.575 m)   Wt 203 lb 2 oz (92.1 kg)   SpO2 100%   BMI 37.14 kg/m    Physical Exam Constitutional:      General: She is not in acute distress.    Appearance: She is well-developed. She is not diaphoretic.  HENT:     Right Ear: External ear normal.     Left Ear: External ear normal.  Eyes:     Conjunctiva/sclera: Conjunctivae normal.  Neck:     Comments: Spurling negative Cardiovascular:     Rate and Rhythm: Normal rate.  Pulmonary:     Effort: Pulmonary effort is normal.  Musculoskeletal:     Cervical back: Neck supple.     Comments: Left hand Inspection: swelling of  hand, wrist, and partial forearm. No erythema Palpation: no tenderness along bones with palpation ROM: normal w/o pain Strength: normal Special tests: negative tinel and phallen (though pt notes persistent numbness and tingling).   Elbow tinel also negative  Skin:    General: Skin is warm and dry.     Capillary Refill: Capillary refill takes less than 2 seconds.  Neurological:     Mental Status: She is alert. Mental status is at baseline.  Psychiatric:        Mood and Affect: Mood normal.        Behavior: Behavior normal.          Assessment & Plan:   Problem List Items Addressed This Visit       Endocrine   Type 2 diabetes mellitus without complication, with long-term current use of insulin (HCC)    Lab Results  Component Value Date   HGBA1C 7.9 (A) 01/21/2021  Discussed steroids as an option given lack of improvement with OTC pain relievers with but DM would like to avoid. Anticipate if pain not improved would plan for temporary increase in  lantus if needing steroids.          Other   Left hand pain - Primary    Pt with hx of carpal tunnel but now with whole hand swelling and numbness. Overall reassuring exam w/o bony tenderness or impacts in function. Suspect some of her symptoms are 2/2 to swelling and will focus on NSAIDs and rest/elevation/bracing as tolerated. Hx of carpal tunnel in that hand so this could be contributing. Advised ortho f/u or call back if no improvement to consider course of steroids. If swelling worsens may need to consider blood clot but with localized to the hand do not suspect that at this time.       Relevant Medications   meloxicam (MOBIC) 15 MG tablet     Return if symptoms worsen or fail to improve.  Lynnda Child, MD  This visit occurred during the SARS-CoV-2 public health emergency.  Safety protocols were in place, including screening questions prior to the visit, additional usage of staff PPE, and extensive cleaning of exam room  while observing appropriate contact time as indicated for disinfecting solutions.

## 2021-02-11 ENCOUNTER — Other Ambulatory Visit: Payer: Self-pay

## 2021-02-11 ENCOUNTER — Ambulatory Visit (INDEPENDENT_AMBULATORY_CARE_PROVIDER_SITE_OTHER): Payer: BC Managed Care – PPO | Admitting: Family Medicine

## 2021-02-11 VITALS — BP 138/78 | HR 95 | Temp 97.0°F | Ht 62.0 in | Wt 203.2 lb

## 2021-02-11 DIAGNOSIS — Z794 Long term (current) use of insulin: Secondary | ICD-10-CM

## 2021-02-11 DIAGNOSIS — E119 Type 2 diabetes mellitus without complications: Secondary | ICD-10-CM | POA: Diagnosis not present

## 2021-02-11 DIAGNOSIS — M79642 Pain in left hand: Secondary | ICD-10-CM | POA: Diagnosis not present

## 2021-02-11 MED ORDER — PREDNISONE 20 MG PO TABS: ORAL_TABLET | ORAL | 0 refills | Status: AC

## 2021-02-11 NOTE — Progress Notes (Signed)
Subjective:     [facility] Allison [name] is a 35 y.o. female presenting for Hand Pain (L, Still painful and swollen )     Hand Pain    #Left hand pain - has not been back to work  - has been resting - is taking the meloxicam - felt good on Saturday night - cannot bend her fingers - planning to see ortho - but not sure when she can get get  - wearing the brace w/o clear improvement - ordered a new one - whole hand numbness and pain in the elbow -   #DM - taking insulin - checking sugar ~3.5 hours after meal before bedtime - takes insulin before her night shift -  Lab Results  Component Value Date   HGBA1C 7.9 (A) 01/21/2021   Ok with increasing insulin  Did not tolerate opiates post op Notes worsening reaction to her prior steroid injection with ortho before  Review of Systems   Social History   Tobacco Use  Smoking Status Every Day   Packs/day: 1.00   Years: 10.00   Pack years: 10.00   Types: Cigarettes   Start date: 05/14/2006  Smokeless Tobacco Never        Objective:    BP Readings from Last 3 Encounters:  02/11/21 138/78  02/06/21 (!) 150/90  01/21/21 130/86   Wt Readings from Last 3 Encounters:  02/11/21 203 lb 4 oz (92.2 kg)  02/06/21 203 lb 2 oz (92.1 kg)  01/21/21 203 lb 12.8 oz (92.4 kg)    BP 138/78   Pulse 95   Temp (!) 97 F (36.1 C) (Temporal)   Ht 5\' 2"  (1.575 m)   Wt 203 lb 4 oz (92.2 kg)   SpO2 99%   BMI 37.17 kg/m    Physical Exam Constitutional:      General: She is not in acute distress.    Appearance: She is well-developed. She is not diaphoretic.  HENT:     Right Ear: External ear normal.     Left Ear: External ear normal.  Eyes:     Conjunctiva/sclera: Conjunctivae normal.  Cardiovascular:     Rate and Rhythm: Normal rate.  Pulmonary:     Effort: Pulmonary effort is normal.  Musculoskeletal:     Cervical back: Neck supple.     Comments: Left hand Inspection: no swelling/erythema Palpation: No ttp along  elbow or wrist ROM: normal Strength: normal Elbow tinel: ?positive - numbness/tingling of the 3rd and 4th digit Wrist phallen/tinel: no worsening of numbness which is constant  Skin:    General: Skin is warm and dry.     Capillary Refill: Capillary refill takes less than 2 seconds.  Neurological:     Mental Status: She is alert. Mental status is at baseline.  Psychiatric:        Mood and Affect: Mood normal.        Behavior: Behavior normal.          Assessment & Plan:   Problem List Items Addressed This Visit       Endocrine   Type 2 diabetes mellitus without complication, with long-term current use of insulin (HCC)    She will increase insulin by 2 units the day she starts steroids. She will switch to checking either fasting or 2 hours post-prandial and increase by 2 units if elevated.   Has PCP f/u in a few weeks for DM  Discussed she may need to decrease insulin as prednisone lowers -  she will monitor closely.         Other   Left hand pain - Primary    Suspect carpal and ulnar tunnel component without response to meloxicam or bracing. Discussed prednisone vs opiates and given intolerance to opiates will do prednisone with plan to monitor sugar closely. She is going to schedule f/u with ortho for further evaluation and treatment. Out of work for 2 more days - update if needing more time. Discussed side effects of prednisone.       Relevant Medications   predniSONE (DELTASONE) 20 MG tablet     Return if symptoms worsen or fail to improve.  Lynnda Child, MD  This visit occurred during the SARS-CoV-2 public health emergency.  Safety protocols were in place, including screening questions prior to the visit, additional usage of staff PPE, and extensive cleaning of exam room while observing appropriate contact time as indicated for disinfecting solutions.

## 2021-02-11 NOTE — Telephone Encounter (Signed)
I would go ahead and switch to the ozempic. She will end up on it in a year anyway, so we should go ahead with the switch.

## 2021-02-11 NOTE — Assessment & Plan Note (Signed)
Suspect carpal and ulnar tunnel component without response to meloxicam or bracing. Discussed prednisone vs opiates and given intolerance to opiates will do prednisone with plan to monitor sugar closely. She is going to schedule f/u with ortho for further evaluation and treatment. Out of work for 2 more days - update if needing more time. Discussed side effects of prednisone.

## 2021-02-11 NOTE — Telephone Encounter (Signed)
Reviewed PA denial paperwork. Mounarjo is only approved when "two alternative medications on the member's formulary have been tried and did not work, or the member is unable to take all of the alternatives".   Unlikely [employer] will approve Mounjaro on the basis of improved glycemic benefit over an agent she has not tried - Ozempic   Will route to PCP. Consider continuation of Mounjaro for a year (as tolerated) while the drug is accessible on the savings card program vs switch to Ozempic. If unable to achieve glycemic control on Ozempic, then the argument could be made for [facility] to her insurance plan.

## 2021-02-11 NOTE — Patient Instructions (Addendum)
#  hand pain - take steroids - increase insulin by 2 units the day you start - will need to adjust insulin by 2 units  -- Fasting Goal is <140 -- 2 hours post meal goal is <180  With lower doses of prednisone - your need for increased insulin with decrease  Call the surgeon to discuss as well

## 2021-02-11 NOTE — Assessment & Plan Note (Signed)
She will increase insulin by 2 units the day she starts steroids. She will switch to checking either fasting or 2 hours post-prandial and increase by 2 units if elevated.   Has PCP f/u in a few weeks for DM  Discussed she may need to decrease insulin as prednisone lowers - she will monitor closely.

## 2021-02-13 DIAGNOSIS — G5602 Carpal tunnel syndrome, left upper limb: Secondary | ICD-10-CM | POA: Diagnosis not present

## 2021-02-28 ENCOUNTER — Telehealth: Payer: Self-pay

## 2021-03-01 NOTE — Telephone Encounter (Signed)
Noted. Has she tried to access the  [facility] with the discount card? This would get her coverage for around $25 for the mounjaro every 1-3 months for up to 12 months.

## 2021-03-01 NOTE — Telephone Encounter (Signed)
She was on Trulicity previously.  Was that included in the PA?  That functions exactly the same as the Ozempic and Victoza.

## 2021-03-01 NOTE — Telephone Encounter (Signed)
Trulicity was included on the PA and I called her insurance and informed them that she had been on Metformin as well and they stated that one of the three medications should be chose and a PA still would need to be done on one of those three.  Sharnell Knight,cma

## 2021-03-04 ENCOUNTER — Ambulatory Visit: Payer: BC Managed Care – PPO | Admitting: Family Medicine

## 2021-03-04 NOTE — Telephone Encounter (Signed)
LVM for patient to call back.   Reyna Lorenzi,cma  

## 2021-03-05 NOTE — Telephone Encounter (Signed)
I called and LVM for patient to call back.  Denitra Donaghey,cma  

## 2021-03-08 NOTE — Telephone Encounter (Signed)
Patient returned a call and stated she tried to use the discount card on the [facility] and it still had a block on  it and she could not use it at the pharmacy.  Patient stated she missed her appointment this week because her grandmother died and she will call back to reschedule after the arrangements are done.  Kolleen Ochsner,cma

## 2021-03-08 NOTE — Telephone Encounter (Signed)
Attempted to contact patient to discuss. Left voicemail for her to return our call at her convenience.

## 2021-03-08 NOTE — Telephone Encounter (Signed)
Noted. I am sorry to hear that. I am going to forward to Catie to see if she has had any issues with patients not being able to access the discount card for mounjaro.

## 2021-03-13 ENCOUNTER — Telehealth: Payer: Self-pay | Admitting: Pharmacist

## 2021-03-13 ENCOUNTER — Ambulatory Visit: Payer: BC Managed Care – PPO | Admitting: Pharmacist

## 2021-03-13 DIAGNOSIS — E785 Hyperlipidemia, unspecified: Secondary | ICD-10-CM

## 2021-03-13 DIAGNOSIS — E119 Type 2 diabetes mellitus without complications: Secondary | ICD-10-CM

## 2021-03-13 DIAGNOSIS — Z794 Long term (current) use of insulin: Secondary | ICD-10-CM

## 2021-03-13 MED ORDER — OZEMPIC (0.25 OR 0.5 MG/DOSE) 2 MG/1.5ML ~~LOC~~ SOPN: 0.5000 mg | PEN_INJECTOR | SUBCUTANEOUS | 2 refills | Status: DC

## 2021-03-13 NOTE — Patient Instructions (Signed)
Allison [name],   It was great talking with you today!  Stop Trulicity 4.5 mg. Start Ozempic 0.25 mg weekly for 2 weeks then increase to 0.5 mg weekly (if tolerated). Continue Basaglar 40 units daily. Please decrease this if you develop low blood sugars.   Let me know how you like the FreeStyle [place] 3! We can absolutely send a prescription for that if you would like.   Take care!  Catie Feliz Beam, PharmD  Visit Information  PATIENT GOALS/PLAN OF CARE:  Care Plan : Medication Management  Updates made by Lourena Simmonds, RPH-CPP since 03/13/2021 12:00 AM     Problem: Diabetes,      Long-Range Goal: Disease Progression Prevention   Start Date: 03/13/2021  This Visit's Progress: On track  Priority: High  Note:   Current Barriers:  Unable to achieve control of diabetes   Pharmacist Clinical Goal(s):  patient will achieve control of diabetes as evidenced by A1c  through collaboration with PharmD and provider.   Interventions: 1:1 collaboration with Glori Luis, MD regarding development and update of comprehensive plan of care as evidenced by provider attestation and co-signature Inter-disciplinary care team collaboration (see longitudinal plan of care) Comprehensive medication review performed; medication list updated in electronic medical record   Diabetes:  Uncontrolled; current treatment: Trulicity 4.5 mg weekly, Basaglar 40 units dialy Hx metformin - GI upset, though does not appeal XR metformin was tried Hx Jardiance - recurrent yeast infections Current glucose readings: not checking often, often forgets (works third shift), though 170-200s throughout the day Never was able to get Mounjaro. Appears she never downloaded manufacturer savings card. PA for Greggory Keen was denied as patient had not tried and failed 2 insurance alternatives.  Discussed Ozempic. Patient amenable to change due to increased potency vs Trulicity. Stop Trulicity and start Ozempic 0.25 mg weekly x 2  weeks then increase to 0.5 mg weekly (quicker titration due to baseline GLP1 tolerability). Follow for ability/need to increase to 1 mg weekly after at least 4 weeks on 0.5 mg.  Continue Basaglar 40 units daily at this time given hyperglycemia. Advised patient to reduce dose if development of hypoglycemia with Ozempic titration.  Discussed use of CGM to help evaluate blood sugars. Patient amenable to try sample of Libre 3. Will place at front desk.   Hypertension:   Moderately well controlled at recent office visits; current treatment: lisinopril 40 mg daily, amlodipine 10 mg daily;  Recommended to continue current regimen at this time  Hyperlipidemia: Controlled; current regimen: rosuvastatin 40 mg daily Recommended to continue current regimen at this time   Patient Goals/Self-Care Activities patient will:  - take medications as prescribed as evidenced by patient report and record review check glucose using CGM, document, and provide at future appointments      [name] was given information about Care Management services by the embedded care coordination team including:  Care Management services include personalized support from designated clinical staff supervised by her physician, including individualized plan of care and coordination with other care providers 24/7 contact phone numbers for assistance for urgent and routine care needs. The patient may stop CCM services at any time (effective at the end of the month) by phone call to the office staff.  Patient agreed to services and verbal consent obtained.   Patient verbalizes understanding of instructions provided today and agrees to view in MyChart.    Plan: Telephone follow up appointment with care management team member scheduled for:  6 weeks  Catie Feliz Beam,  PharmD, Patsy [place], CPP Clinical Pharmacist [employer] [facility] [phone]

## 2021-03-13 NOTE — Chronic Care Management (AMB) (Signed)
Chronic Care Management CCM Pharmacy Note  03/13/2021 Name:  Allison [name] MRN:  [id number] DOB:  Jun 07, 1985  Subjective: Allison [name] is an 35 y.o. year old female who is a primary patient of [name], [name], MD.  The CCM team was consulted for assistance with disease management and care coordination needs.    Engaged with patient by telephone for initial visit for pharmacy case management and/or care coordination services.   Objective:  Medications Reviewed Today     Reviewed by De Hollingshead, RPH-CPP (Pharmacist) on 03/13/21 at 1127  Med List Status: <None>   Medication Order Taking? Sig Documenting Provider Last Dose Status Informant  adapalene (DIFFERIN) 0.1 % cream [id number]  Apply topically at bedtime. Leone Haven, MD  Active   amLODipine (NORVASC) 10 MG tablet [id number]  Take 1 tablet (10 mg total) by mouth daily. Leone Haven, MD  Active   blood glucose meter kit and supplies KIT [id number]  Dispense based on patient and insurance preference. Use 3 times daily as directed. (FOR ICD-10 E11.9). Leone Haven, MD  Active   Dextromethorphan-guaiFENesin 5-100 MG/5ML Yehuda Budd [id number]  Take 10 mLs by mouth at bedtime as needed (for cough). Marval Regal, NP  Active    Discontinued 03/13/21 1118 (Change in therapy)   fluticasone (FLONASE) 50 MCG/ACT nasal spray [id number]  Place 2 sprays into both nostrils daily. Leone Haven, MD  Active   glucose blood test strip [id number]  Use as instructed Coral Spikes, DO  Active   insulin glargine (LANTUS SOLOSTAR) 100 UNIT/ML Solostar Pen [id number]  Inject 40 Units into the skin at bedtime. Leone Haven, MD  Active   lisinopril (ZESTRIL) 40 MG tablet [id number]  Take 1 tablet (40 mg total) by mouth daily. Leone Haven, MD  Active   meloxicam [facility]) 15 MG tablet [id number]  Take 1 tablet (15 mg total) by mouth daily. Lesleigh Noe, MD  Active   omeprazole (PRILOSEC) 20 MG capsule [id number]   Take 1 capsule (20 mg total) by mouth daily. Leone Haven, MD  Active   ondansetron (ZOFRAN-ODT) 4 MG disintegrating tablet [id number]   [provider]  Active   oxyCODONE (OXY IR/ROXICODONE) 5 MG immediate release tablet [id number]  Take 5 mg by mouth every 4 (four) hours. [provider]  Active   rosuvastatin (CRESTOR) 40 MG tablet [id number]  Take 1 tablet (40 mg total) by mouth daily. Leone Haven, MD  Active   Semaglutide,0.25 or 0.5MG/DOS, (OZEMPIC, 0.25 OR 0.5 MG/DOSE,) 2 MG/1.5ML SOPN [id number] Yes Inject 0.5 mg into the skin once a week. Leone Haven, MD  Active   sertraline (ZOLOFT) 50 MG tablet [id number]  Take 1 tablet (50 mg total) by mouth daily. Leone Haven, MD  Active     Discontinued 03/13/21 1118 (Completed Course)   Vitamin D, Ergocalciferol, (DRISDOL) 1.25 MG (50000 UT) CAPS capsule [id number]  Take 1 capsule (50,000 Units total) by mouth 2 (two) times a week. [name], CNM  Active             Pertinent Labs:   Lab Results  Component Value Date   HGBA1C 7.9 (A) 01/21/2021   Lab Results  Component Value Date   CHOL 140 10/12/2020   HDL 22 (L) 10/12/2020   LDLCALC 43 10/12/2020   TRIG 519 (H) 10/12/2020   CHOLHDL 6.4 (H) 10/12/2020   Lab Results  Component Value Date   CREATININE  0.78 02/05/2021   BUN 15 02/05/2021   NA 138 02/05/2021   K 4.0 02/05/2021   CL 103 02/05/2021   CO2 27 02/05/2021     SDOH:  (Social Determinants of Health) assessments and interventions performed: Yes SDOH Interventions    Flowsheet Row Most Recent Value  SDOH Interventions   Financial Strain Interventions Intervention Not Indicated       CCM Care Plan  Review of patient past medical history, allergies, medications, health status, including review of consultants reports, laboratory and other test data, was performed as part of comprehensive evaluation and provision of chronic care management services.   Conditions to  be addressed/monitored:  Hypertension, Hyperlipidemia, and Diabetes  Care Plan : Medication Management  Updates made by De Hollingshead, RPH-CPP since 03/13/2021 12:00 AM     Problem: Diabetes,      Long-Range Goal: Disease Progression Prevention   Start Date: 03/13/2021  This Visit's Progress: On track  Priority: High  Note:   Current Barriers:  Unable to achieve control of diabetes   Pharmacist Clinical Goal(s):  patient will achieve control of diabetes as evidenced by A1c  through collaboration with PharmD and provider.   Interventions: 1:1 collaboration with Leone Haven, MD regarding development and update of comprehensive plan of care as evidenced by provider attestation and co-signature Inter-disciplinary care team collaboration (see longitudinal plan of care) Comprehensive medication review performed; medication list updated in electronic medical record   Diabetes:  Uncontrolled; current treatment: Trulicity 4.5 mg weekly, Basaglar 40 units dialy Hx metformin - GI upset, though does not appeal XR metformin was tried Hx Jardiance - recurrent yeast infections Current glucose readings: not checking often, often forgets (works third shift), though 170-200s throughout the day Never was able to get Mounjaro. Appears she never downloaded manufacturer savings card. PA for Darcel Bayley was denied as patient had not tried and failed 2 insurance alternatives.  Discussed Ozempic. Patient amenable to change due to increased potency vs Trulicity. Stop Trulicity and start Ozempic 0.25 mg weekly x 2 weeks then increase to 0.5 mg weekly (quicker titration due to baseline GLP1 tolerability). Follow for ability/need to increase to 1 mg weekly after at least 4 weeks on 0.5 mg.  Continue Basaglar 40 units daily at this time given hyperglycemia. Advised patient to reduce dose if development of hypoglycemia with Ozempic titration.  Discussed use of CGM to help evaluate blood sugars. Patient  amenable to try sample of Libre 3. Will place at front desk.   Hypertension:   Moderately well controlled at recent office visits; current treatment: lisinopril 40 mg daily, amlodipine 10 mg daily;  Recommended to continue current regimen at this time  Hyperlipidemia: Controlled; current regimen: rosuvastatin 40 mg daily Recommended to continue current regimen at this time   Patient Goals/Self-Care Activities patient will:  - take medications as prescribed as evidenced by patient report and record review check glucose using CGM, document, and provide at future appointments       Plan: Telephone follow up appointment with care management team member scheduled for:  6 weeks  [name], PharmD, [place], [place] Pharmacist [employer] at [employer] [phone]

## 2021-03-13 NOTE — Telephone Encounter (Signed)
Medication Samples have been labeled and logged for the patient. She plans to pick up tomorrow, along with Josephine Igo sample at front desk  Drug name: Ozempic        Strength: 2 mg/1.5 mg        Qty: 1 pen  LOT: [id number]  Exp.Date: 06/11/22  Dosing instructions: Inject 0.25 mg weekly for 2 weeks, then increase to 0.5 mg weekly thereafter  The patient has been instructed regarding the correct time, dose, and frequency of taking this medication, including desired effects and most common side effects.   Allison [name] 11:38 AM 03/13/2021

## 2021-04-24 ENCOUNTER — Telehealth: Payer: BC Managed Care – PPO

## 2021-04-26 ENCOUNTER — Ambulatory Visit: Payer: BC Managed Care – PPO | Admitting: Pharmacist

## 2021-04-26 DIAGNOSIS — E119 Type 2 diabetes mellitus without complications: Secondary | ICD-10-CM

## 2021-04-26 DIAGNOSIS — Z794 Long term (current) use of insulin: Secondary | ICD-10-CM

## 2021-04-26 MED ORDER — OZEMPIC (1 MG/DOSE) 4 MG/3ML ~~LOC~~ SOPN: 1.0000 mg | PEN_INJECTOR | SUBCUTANEOUS | 2 refills | Status: DC

## 2021-04-26 NOTE — Patient Instructions (Signed)
Allison [name],   Increase Ozempic to 1 mg weekly. You can use two of the 0.5 mg doses, one after the other, to use up your current supply. Continue Lantus 40 units for now. If you develop low blood sugars in the morning (<70), you can decrease the Lantus to 36 units.   Call us if [facility] cannot get the Ozempic 1 mg. We can check with our [facility] pharmacies.   Take care!  Catie Feliz Beam, PharmD

## 2021-04-26 NOTE — Chronic Care Management (AMB) (Signed)
Chronic Care Management CCM Pharmacy Note  04/26/2021 Name:  Allison [name] MRN:  [id number] DOB:  1985/09/27  Summary: - Tolerating Ozempic. Glucose not at goal  Recommendations/Changes made from today's visit: - Increase Ozempic to 1 mg weekly.  - Scheduled follow up with PCP  Subjective: Allison [name] is an 35 y.o. year old female who is a primary patient of Allison [name], Allison Adam, MD.  The CCM team was consulted for assistance with disease management and care coordination needs.    Engaged with patient by telephone for follow up visit for pharmacy case management and/or care coordination services.   Objective:  Medications Reviewed Today     Reviewed by De Hollingshead, RPH-CPP (Pharmacist) on 04/26/21 at [place] List Status: <None>   Medication Order Taking? Sig Documenting Provider Last Dose Status Informant  adapalene (DIFFERIN) 0.1 % cream [id number]  Apply topically at bedtime. Leone Haven, MD  Active   amLODipine (NORVASC) 10 MG tablet [id number]  Take 1 tablet (10 mg total) by mouth daily. Leone Haven, MD  Active   blood glucose meter kit and supplies KIT [id number]  Dispense based on patient and insurance preference. Use 3 times daily as directed. (FOR ICD-10 E11.9). Leone Haven, MD  Active   Dextromethorphan-guaiFENesin 5-100 MG/5ML Yehuda Budd [id number]  Take 10 mLs by mouth at bedtime as needed (for cough). Marval Regal, NP  Active   fluticasone (FLONASE) 50 MCG/ACT nasal spray [id number]  Place 2 sprays into both nostrils daily. Leone Haven, MD  Active   glucose blood test strip [id number]  Use as instructed Coral Spikes, DO  Active   insulin glargine (LANTUS SOLOSTAR) 100 UNIT/ML Solostar Pen [id number] Yes Inject 40 Units into the skin at bedtime. Leone Haven, MD Taking Active   lisinopril (ZESTRIL) 40 MG tablet [id number]  Take 1 tablet (40 mg total) by mouth daily. Leone Haven, MD  Active   meloxicam [facility]) 15 MG  tablet [id number]  Take 1 tablet (15 mg total) by mouth daily. Lesleigh Noe, MD  Active   omeprazole (PRILOSEC) 20 MG capsule [id number]  Take 1 capsule (20 mg total) by mouth daily. Leone Haven, MD  Active   ondansetron (ZOFRAN-ODT) 4 MG disintegrating tablet [id number]   [provider]  Active   oxyCODONE (OXY IR/ROXICODONE) 5 MG immediate release tablet [id number]  Take 5 mg by mouth every 4 (four) hours. [provider]  Active   rosuvastatin (CRESTOR) 40 MG tablet [id number]  Take 1 tablet (40 mg total) by mouth daily. Leone Haven, MD  Active   Semaglutide,0.25 or 0.5MG/DOS, (OZEMPIC, 0.25 OR 0.5 MG/DOSE,) 2 MG/1.5ML SOPN [id number] Yes Inject 0.5 mg into the skin once a week. Leone Haven, MD Taking Active   sertraline (ZOLOFT) 50 MG tablet [id number]  Take 1 tablet (50 mg total) by mouth daily. Leone Haven, MD  Active   Vitamin D, Ergocalciferol, (DRISDOL) 1.25 MG (50000 UT) CAPS capsule [id number]  Take 1 capsule (50,000 Units total) by mouth 2 (two) times a week. Allison [name], CNM  Active             Pertinent Labs:   Lab Results  Component Value Date   HGBA1C 7.9 (A) 01/21/2021   Lab Results  Component Value Date   CHOL 140 10/12/2020   HDL 22 (L) 10/12/2020   LDLCALC 43 10/12/2020   TRIG 519 (H) 10/12/2020   CHOLHDL 6.4 (H)  10/12/2020   Lab Results  Component Value Date   CREATININE 0.78 02/05/2021   BUN 15 02/05/2021   NA 138 02/05/2021   K 4.0 02/05/2021   CL 103 02/05/2021   CO2 27 02/05/2021    SDOH:  (Social Determinants of Health) assessments and interventions performed:  SDOH Interventions    Flowsheet Row Most Recent Value  SDOH Interventions   Financial Strain Interventions Intervention Not Indicated       CCM Care Plan  Review of patient past medical history, allergies, medications, health status, including review of consultants reports, laboratory and other test data, was performed as part of  comprehensive evaluation and provision of chronic care management services.   Care Plan : Medication Management  Updates made by De Hollingshead, RPH-CPP since 04/26/2021 12:00 AM     Problem: Diabetes,      Long-Range Goal: Disease Progression Prevention   Start Date: 03/13/2021  Recent Progress: On track  Priority: High  Note:   Current Barriers:  Unable to achieve control of diabetes   Pharmacist Clinical Goal(s):  patient will achieve control of diabetes as evidenced by A1c  through collaboration with PharmD and provider.   Interventions: 1:1 collaboration with Leone Haven, MD regarding development and update of comprehensive plan of care as evidenced by provider attestation and co-signature Inter-disciplinary care team collaboration (see longitudinal plan of care) Comprehensive medication review performed; medication list updated in electronic medical record   Diabetes:  Uncontrolled; current treatment: Ozempic 0.5 mg weekly; Lantus 40 units daily Denies GI intolerability Hx metformin - GI upset, though does not appear XR metformin was tried Hx Jardiance - recurrent yeast infections Current glucose readings: fasting readings: 90-150s; 2 hour post prandials up to 200s Reports she picked up the [place] 3 and was concerned with the size of the filament, so chooses not to use CGM Increase Ozempic to 1 mg weekly. Patient instructed to contact me if [facility] cannot get. Advised that if development of fasting hypoglycemia she can reduce Basaglar to 36 units daily   Hypertension:   Moderately well controlled at recent office visits; current treatment: lisinopril 40 mg daily, amlodipine 10 mg daily;  Recommended to continue current regimen at this time  Hyperlipidemia: Controlled; current regimen: rosuvastatin 40 mg daily Recommended to continue current regimen at this time   Patient Goals/Self-Care Activities patient will:  - take medications as prescribed as  evidenced by patient report and record review check glucose using CGM, document, and provide at future appointments       Plan: Video visit scheduled in 6 weeks  Allison [name], PharmD, [place], CPP Clinical Pharmacist [employer] at [facility] [phone]

## 2021-06-05 ENCOUNTER — Other Ambulatory Visit: Payer: Self-pay | Admitting: Family Medicine

## 2021-06-05 DIAGNOSIS — I10 Essential (primary) hypertension: Secondary | ICD-10-CM

## 2021-06-07 ENCOUNTER — Telehealth: Payer: BC Managed Care – PPO

## 2021-06-13 ENCOUNTER — Telehealth (INDEPENDENT_AMBULATORY_CARE_PROVIDER_SITE_OTHER): Payer: BC Managed Care – PPO | Admitting: Pharmacist

## 2021-06-13 DIAGNOSIS — E119 Type 2 diabetes mellitus without complications: Secondary | ICD-10-CM | POA: Diagnosis not present

## 2021-06-13 DIAGNOSIS — Z794 Long term (current) use of insulin: Secondary | ICD-10-CM | POA: Diagnosis not present

## 2021-06-13 MED ORDER — LANTUS SOLOSTAR 100 UNIT/ML ~~LOC~~ SOPN: 40.0000 [IU] | PEN_INJECTOR | Freq: Every day | SUBCUTANEOUS | 2 refills | Status: DC

## 2021-06-13 NOTE — Patient Instructions (Signed)
Cala Bradford,   Continue your current regimen. Please let us know if you have any questions or concerns!  Catie Feliz Beam, PharmD

## 2021-06-13 NOTE — Progress Notes (Signed)
Chief Complaint  Patient presents with   Diabetes    Allison [name] is a 36 y.o. year old female who was referred for medication management by their primary care provider, [name], [name], MD. They presented for a virtual visit in the context of the COVID-19 pandemic.   I connected with Joelene Millin on 06/13/21 at 8:30 am by video and verified that I am speaking with the correct person using two identifiers.   I discussed the limitations, risks, security and privacy concerns of performing an evaluation and management service by video and the availability of in person appointments. I also discussed with the patient that there may be a patient responsible charge related to this service. The patient expressed understanding and agreed to proceed.   Patient location:  car My Location:  clinic Persons on the video call:  patient and myself   Subjective: Diabetes:  Current medications:  Lantus 40 units daily, Ozempic 1 mg weekly Medications tried in the past: metformin, though does not appear she has tried XR before  Does indicate nausea since increasing Ozempic dose, though has been eating more salty/possibly fried foods to try and combat empty stomach nausea.   Current glucose readings: fastings generally <130, lowest 110  Patient denies hypoglycemic s/sx including dizziness, shakiness, sweating.   Objective: Lab Results  Component Value Date   HGBA1C 7.9 (A) 01/21/2021    Lab Results  Component Value Date   CREATININE 0.78 02/05/2021   BUN 15 02/05/2021   NA 138 02/05/2021   K 4.0 02/05/2021   CL 103 02/05/2021   CO2 27 02/05/2021    Lab Results  Component Value Date   CHOL 140 10/12/2020   HDL 22 (L) 10/12/2020   LDLCALC 43 10/12/2020   TRIG 519 (H) 10/12/2020   CHOLHDL 6.4 (H) 10/12/2020    Medications Reviewed Today     Reviewed by De Hollingshead, RPH-CPP (Pharmacist) on 06/13/21 at [phone]  Med List Status: <None>   Medication Order Taking? Sig  Documenting Provider Last Dose Status Informant  adapalene (DIFFERIN) 0.1 % cream [id number]  Apply topically at bedtime. Leone Haven, MD  Active   amLODipine (NORVASC) 10 MG tablet [id number]  Take 1 tablet by mouth once daily Leone Haven, MD  Active   blood glucose meter kit and supplies KIT [id number]  Dispense based on patient and insurance preference. Use 3 times daily as directed. (FOR ICD-10 E11.9). Leone Haven, MD  Active   Dextromethorphan-guaiFENesin 5-100 MG/5ML Yehuda Budd [id number]  Take 10 mLs by mouth at bedtime as needed (for cough). Marval Regal, NP  Active   fluticasone (FLONASE) 50 MCG/ACT nasal spray [id number]  Place 2 sprays into both nostrils daily. Leone Haven, MD  Active   glucose blood test strip [id number]  Use as instructed Coral Spikes, DO  Active   insulin glargine (LANTUS SOLOSTAR) 100 UNIT/ML Solostar Pen [id number] Yes Inject 40 Units into the skin at bedtime. Leone Haven, MD Taking Active   lisinopril (ZESTRIL) 40 MG tablet [id number]  Take 1 tablet (40 mg total) by mouth daily. Leone Haven, MD  Active   meloxicam [facility]) 15 MG tablet [id number]  Take 1 tablet (15 mg total) by mouth daily. Lesleigh Noe, MD  Active   omeprazole (PRILOSEC) 20 MG capsule [id number]  Take 1 capsule (20 mg total) by mouth daily. Leone Haven, MD  Active   ondansetron (ZOFRAN-ODT) 4 MG disintegrating tablet  [id number]   [provider]  Active   oxyCODONE (OXY IR/ROXICODONE) 5 MG immediate release tablet [id number]  Take 5 mg by mouth every 4 (four) hours. [provider]  Active   rosuvastatin (CRESTOR) 40 MG tablet [id number]  Take 1 tablet by mouth once daily Leone Haven, MD  Active   Semaglutide, 1 MG/DOSE, (OZEMPIC, 1 MG/DOSE,) 4 MG/3ML SOPN [id number] Yes Inject 1 mg into the skin once a week. Leone Haven, MD Taking Active   sertraline (ZOLOFT) 50 MG tablet [id number]  Take 1 tablet (50 mg total) by mouth daily.  Leone Haven, MD  Active   Vitamin D, Ergocalciferol, (DRISDOL) 1.25 MG (50000 UT) CAPS capsule [id number]  Take 1 capsule (50,000 Units total) by mouth 2 (two) times a week. Shambley, Melody N, CNM  Active             Assessment/Plan:   Diabetes: - Currently uncontrolled - Reviewed goal A1c, goal fasting, and goal 2 hour post prandial glucose - Reviewed dietary modifications including reducing fatty/fried foods to help reduce risk of GLP-related nausea - Discussed option to reduce Ozempic dose. Patient declines, would like to see if she tolerates better with dietary modifications and continued time.  - Continue Lantus 40 units daily and Ozempic 1 mg weekly. Follow up with PCP in ~ 4 weeks for A1c.     Follow Up Plan: video visit in ~ 10 weeks  [name], PharmD, [place], CPP Clinical Pharmacist [employer] at [employer] [phone]

## 2021-07-16 ENCOUNTER — Encounter: Payer: Self-pay | Admitting: Family Medicine

## 2021-07-16 ENCOUNTER — Ambulatory Visit (INDEPENDENT_AMBULATORY_CARE_PROVIDER_SITE_OTHER): Payer: BC Managed Care – PPO | Admitting: Family Medicine

## 2021-07-16 ENCOUNTER — Ambulatory Visit: Payer: Self-pay | Admitting: Pharmacist

## 2021-07-16 ENCOUNTER — Other Ambulatory Visit: Payer: Self-pay

## 2021-07-16 VITALS — BP 130/70 | HR 107 | Temp 98.6°F | Ht 62.0 in | Wt 202.4 lb

## 2021-07-16 DIAGNOSIS — E119 Type 2 diabetes mellitus without complications: Secondary | ICD-10-CM | POA: Diagnosis not present

## 2021-07-16 DIAGNOSIS — Z794 Long term (current) use of insulin: Secondary | ICD-10-CM | POA: Diagnosis not present

## 2021-07-16 DIAGNOSIS — I10 Essential (primary) hypertension: Secondary | ICD-10-CM | POA: Diagnosis not present

## 2021-07-16 DIAGNOSIS — M79642 Pain in left hand: Secondary | ICD-10-CM | POA: Diagnosis not present

## 2021-07-16 DIAGNOSIS — Z124 Encounter for screening for malignant neoplasm of cervix: Secondary | ICD-10-CM

## 2021-07-16 LAB — COMPREHENSIVE METABOLIC PANEL
ALT: 14 U/L (ref 0–35)
AST: 10 U/L (ref 0–37)
Albumin: 4.5 g/dL (ref 3.5–5.2)
Alkaline Phosphatase: 55 U/L (ref 39–117)
BUN: 16 mg/dL (ref 6–23)
CO2: 27 mEq/L (ref 19–32)
Calcium: 9.4 mg/dL (ref 8.4–10.5)
Chloride: 101 mEq/L (ref 96–112)
Creatinine, Ser: 0.64 mg/dL (ref 0.40–1.20)
GFR: 113.99 mL/min (ref >60.00)
Glucose, Bld: 235 mg/dL — ABNORMAL HIGH (ref 70–99)
Potassium: 4.4 mEq/L (ref 3.5–5.1)
Sodium: 135 mEq/L (ref 135–145)
Total Bilirubin: 0.3 mg/dL (ref 0.2–1.2)
Total Protein: 6.9 g/dL (ref 6.0–8.3)

## 2021-07-16 LAB — HEMOGLOBIN A1C: Hgb A1c MFr Bld: 9.7 % — ABNORMAL HIGH (ref 4.6–6.5)

## 2021-07-16 NOTE — Progress Notes (Signed)
?Allison Rumps, MD ?Phone: [phone] ? ?[facility] Allison [name] is a 36 y.o. female who presents today for f/u. ? ?DIABETES ?Disease Monitoring: ?Blood Sugar ranges-150s-170s Polyuria/phagia/dipsia- notes polydipsia one day this past week      Optho- UTD-we are requesting records ?Medications: ?Compliance- taking lantus 40 u daily, ozempic Hypoglycemic symptoms- no ?Notes nausea with eating too much at one time though also has nausea at other times. She has cut down her portion sizes.  ? ?HYPERTENSION ?Disease Monitoring ?Home BP Monitoring not checking Chest pain- no    Dyspnea- no ?Medications ?Compliance-  taking amlodipine, lisinopril.   Edema- no ?BMET ?   ?Component Value Date/Time  ? NA 138 02/05/2021 1019  ? NA 136 10/12/2020 1500  ? NA 138 01/24/2012 1902  ? K 4.0 02/05/2021 1019  ? K 4.8 01/24/2012 1902  ? CL 103 02/05/2021 1019  ? CL 106 01/24/2012 1902  ? CO2 27 02/05/2021 1019  ? CO2 24 01/24/2012 1902  ? GLUCOSE 161 (H) 02/05/2021 1019  ? GLUCOSE 109 (H) 03/30/2012 1035  ? BUN 15 02/05/2021 1019  ? BUN 9 10/12/2020 1500  ? BUN 6 (L) 01/24/2012 1902  ? CREATININE 0.78 02/05/2021 1019  ? CREATININE 0.62 01/24/2012 1902  ? CALCIUM 9.3 02/05/2021 1019  ? CALCIUM 8.6 01/24/2012 1902  ? GFRNONAA 113 05/22/2020 0932  ? GFRNONAA >60 01/24/2012 1902  ? GFRAA 131 05/22/2020 0932  ? GFRAA >60 01/24/2012 1902  ? ?Hand pain/swelling: patient has taken tumeric recently for this. Notes good benefit from this.  ? ? ?Social History  ? ?Tobacco Use  ?Smoking Status Every Day  ? Packs/day: 1.00  ? Years: 10.00  ? Pack years: 10.00  ? Types: Cigarettes  ? Start date: 05/14/2006  ?Smokeless Tobacco Never  ? ? ?Current Outpatient Medications on File Prior to Visit  ?Medication Sig Dispense Refill  ? adapalene (DIFFERIN) 0.1 % cream Apply topically at bedtime. 45 g 0  ? amLODipine (NORVASC) 10 MG tablet Take 1 tablet by mouth once daily 90 tablet 0  ? blood glucose meter kit and supplies KIT Dispense based on patient and  insurance preference. Use 3 times daily as directed. (FOR ICD-10 E11.9). 1 each 0  ? Dextromethorphan-guaiFENesin 5-100 MG/5ML LIQD Take 10 mLs by mouth at bedtime as needed (for cough). 70 mL 0  ? fluticasone (FLONASE) 50 MCG/ACT nasal spray Place 2 sprays into both nostrils daily. 16 g 6  ? glucose blood test strip Use as instructed 100 each 11  ? insulin glargine (LANTUS SOLOSTAR) 100 UNIT/ML Solostar Pen Inject 40 Units into the skin at bedtime. 30 mL 2  ? lisinopril (ZESTRIL) 40 MG tablet Take 1 tablet (40 mg total) by mouth daily. 90 tablet 1  ? meloxicam (MOBIC) 15 MG tablet Take 1 tablet (15 mg total) by mouth daily. 30 tablet 0  ? omeprazole (PRILOSEC) 20 MG capsule Take 1 capsule (20 mg total) by mouth daily. 30 capsule 0  ? ondansetron (ZOFRAN-ODT) 4 MG disintegrating tablet     ? oxyCODONE (OXY IR/ROXICODONE) 5 MG immediate release tablet Take 5 mg by mouth every 4 (four) hours.    ? rosuvastatin (CRESTOR) 40 MG tablet Take 1 tablet by mouth once daily 90 tablet 0  ? Semaglutide, 1 MG/DOSE, (OZEMPIC, 1 MG/DOSE,) 4 MG/3ML SOPN Inject 1 mg into the skin once a week. 3 mL 2  ? SEMGLEE, YFGN, 100 UNIT/ML Pen SMARTSIG:40 Unit(s) SUB-Q Every Night    ? sertraline (ZOLOFT) 50 MG  tablet Take 1 tablet (50 mg total) by mouth daily. 30 tablet 3  ? Vitamin D, Ergocalciferol, (DRISDOL) 1.25 MG (50000 UT) CAPS capsule Take 1 capsule (50,000 Units total) by mouth 2 (two) times a week. 30 capsule 2  ? ?No current facility-administered medications on file prior to visit.  ? ? ? ?ROS see history of present illness ? ?Objective ? ?Physical Exam ?Vitals:  ? 07/16/21 0849  ?BP: 130/70  ?Pulse: (!) 107  ?Temp: 98.6 ?F (37 ?C)  ?SpO2: 98%  ? ? ?BP Readings from Last 3 Encounters:  ?07/16/21 130/70  ?02/11/21 138/78  ?02/06/21 (!) 150/90  ? ?Wt Readings from Last 3 Encounters:  ?07/16/21 202 lb 6.4 oz (91.8 kg)  ?02/11/21 203 lb 4 oz (92.2 kg)  ?02/06/21 203 lb 2 oz (92.1 kg)  ? ? ?Physical Exam ?Constitutional:   ?   General:  She is not in acute distress. ?   Appearance: She is not diaphoretic.  ?Cardiovascular:  ?   Rate and Rhythm: Normal rate and regular rhythm.  ?   Heart sounds: Normal heart sounds.  ?Pulmonary:  ?   Effort: Pulmonary effort is normal.  ?   Breath sounds: Normal breath sounds.  ?Skin: ?   General: Skin is warm and dry.  ?Neurological:  ?   Mental Status: She is alert.  ? ?Diabetic Foot Exam - Simple   ?Simple Foot Form ?Diabetic Foot exam was performed with the following findings: Yes 07/16/2021  9:02 AM  ?Visual Inspection ?No deformities, no ulcerations, no other skin breakdown bilaterally: Yes ?Sensation Testing ?Intact to touch and monofilament testing bilaterally: Yes ?Pulse Check ?Posterior Tibialis and Dorsalis pulse intact bilaterally: Yes ?Comments ?  ? ? ? ?Assessment/Plan: Please see individual problem list. ? ?Problem List Items Addressed This Visit   ? ? Benign essential HTN - Primary  ?  Adequate control. She will continue amlodipine 10 mg once daily and lisinopril 40 mg daily. Check CMP.  ?  ?  ? Relevant Orders  ? Comp Met (CMET)  ? Left hand pain  ?  Reports improvement with tumeric. She will let me know the dose of this. We will check LFTs. ?  ?  ? Type 2 diabetes mellitus without complication, with long-term current use of insulin ([place])  ?  Check A1c. Continue lantus 40 u daily. She will continue ozempic 1 mg weekly. She will try slightly smaller portion sizes and see if that helps with her nausea. If this is not beneficial we will need to reduce her ozempic dose.  Message sent to CMA to follow-up with the patient on the nausea later this week. ?  ?  ? Relevant Medications  ? SEMGLEE, YFGN, 100 UNIT/ML Pen  ? Other Relevant Orders  ? HgB A1c  ? ?Other Visit Diagnoses   ? ? Cervical cancer screening      ? Relevant Orders  ? Ambulatory referral to Gynecology  ? ?  ? ? ?Return in about 3 months (around 10/16/2021) for Diabetes. ? ?This visit occurred during the SARS-CoV-2 public health emergency.   Safety protocols were in place, including screening questions prior to the visit, additional usage of staff PPE, and extensive cleaning of exam room while observing appropriate contact time as indicated for disinfecting solutions.  ? ? ?Allison Rumps, MD ?[place] ? ?

## 2021-07-16 NOTE — Assessment & Plan Note (Addendum)
Check A1c. Continue lantus 40 u daily. She will continue ozempic 1 mg weekly. She will try slightly smaller portion sizes and see if that helps with her nausea. If this is not beneficial we will need to reduce her ozempic dose.  Message sent to CMA to follow-up with the patient on the nausea later this week. ?

## 2021-07-16 NOTE — Assessment & Plan Note (Signed)
Adequate control.  She will continue amlodipine 10 mg once daily and lisinopril 40 mg daily.  Check CMP. 

## 2021-07-16 NOTE — Chronic Care Management (AMB) (Signed)
?  Chronic Care Management  ? ?Note ? ?07/16/2021 ?Name: Allison [name] MRN: [id number] DOB: 06/24/1985 ? ? ? ?Closing pharmacy CCM case at this time. Patient has clinic contact information for future questions or concerns.  ? ?Catie Feliz Beam, PharmD, [place], CPP ?Clinical Pharmacist ?Nature conservation officer at [employer] ?[phone] ? ?

## 2021-07-16 NOTE — Assessment & Plan Note (Signed)
Reports improvement with tumeric. She will let me know the dose of this. We will check LFTs. ?

## 2021-07-16 NOTE — Patient Instructions (Signed)
Nice to see you. ?We will request her eye doctor records. ?We will get lab work today and contact you with the results. ?Please make sure you schedule your appointment for a Pap smear. ?

## 2021-07-16 NOTE — Patient Instructions (Signed)
Hi Kimberly,  ? ?Unfortunately, I am being asked to quickly transition into another role within the health system, so I am unable to keep our next appointment. Please continue to follow up with your primary care provider as scheduled.  ? ?It has been a pleasure working with you! ? ?[name], PharmD ? ?

## 2021-07-18 ENCOUNTER — Encounter: Payer: Self-pay | Admitting: Family Medicine

## 2021-07-22 ENCOUNTER — Telehealth: Payer: Self-pay

## 2021-07-22 NOTE — Telephone Encounter (Signed)
Patient returned office phone call for lab results. 

## 2021-07-22 NOTE — Telephone Encounter (Signed)
Lvm for pt to return call in regards to labs.  ?

## 2021-07-23 ENCOUNTER — Other Ambulatory Visit: Payer: Self-pay | Admitting: Family Medicine

## 2021-07-23 DIAGNOSIS — E119 Type 2 diabetes mellitus without complications: Secondary | ICD-10-CM

## 2021-07-25 ENCOUNTER — Telehealth: Payer: Self-pay

## 2021-07-25 NOTE — Telephone Encounter (Signed)
-----   Message from Glori Luis, MD sent at 07/16/2021  9:08 AM EST ----- ?Please call the patient and see how the nausea is with her reducing her portion sizes.  Thanks. ? ?

## 2021-07-25 NOTE — Telephone Encounter (Signed)
I called and LVM for patient to call me back. Yashua Bracco,cma  °

## 2021-07-26 ENCOUNTER — Encounter: Payer: Self-pay | Admitting: Obstetrics and Gynecology

## 2021-07-29 ENCOUNTER — Other Ambulatory Visit: Payer: Self-pay | Admitting: Family Medicine

## 2021-07-29 DIAGNOSIS — E119 Type 2 diabetes mellitus without complications: Secondary | ICD-10-CM

## 2021-07-29 NOTE — Telephone Encounter (Signed)
Pt called in stating she returning call... Advise pt of below note. Pt stated that after she reduce portion size that her nausea have stopped... Pt stated that she is sick right now and having some pain under rib cage... Pt thinks the pain is from her coughing so much...  ?

## 2021-07-29 NOTE — Telephone Encounter (Signed)
Can you contact the patient and see what kind of illness she currently has?  What symptoms is she having? ?

## 2021-07-30 NOTE — Telephone Encounter (Signed)
Noted  

## 2021-07-30 NOTE — Telephone Encounter (Signed)
I called and spoke with the patient and she stated that her kids were sick and for 2 days she had the same symptoms and now it has all gone away and she is fine.  Traquan Duarte,cma  ?

## 2021-08-30 ENCOUNTER — Other Ambulatory Visit: Payer: Self-pay | Admitting: Family Medicine

## 2021-08-30 DIAGNOSIS — E119 Type 2 diabetes mellitus without complications: Secondary | ICD-10-CM

## 2021-09-11 ENCOUNTER — Telehealth: Payer: BC Managed Care – PPO

## 2021-10-11 ENCOUNTER — Encounter: Payer: Self-pay | Admitting: Family Medicine

## 2021-10-11 ENCOUNTER — Ambulatory Visit (INDEPENDENT_AMBULATORY_CARE_PROVIDER_SITE_OTHER): Payer: BC Managed Care – PPO | Admitting: Family Medicine

## 2021-10-11 DIAGNOSIS — E119 Type 2 diabetes mellitus without complications: Secondary | ICD-10-CM

## 2021-10-11 DIAGNOSIS — M25561 Pain in right knee: Secondary | ICD-10-CM

## 2021-10-11 DIAGNOSIS — Z794 Long term (current) use of insulin: Secondary | ICD-10-CM

## 2021-10-11 MED ORDER — OZEMPIC (1 MG/DOSE) 4 MG/3ML ~~LOC~~ SOPN: 1.0000 mg | PEN_INJECTOR | SUBCUTANEOUS | 3 refills | Status: DC

## 2021-10-11 NOTE — Patient Instructions (Signed)
Nice to see you. If the pain returns please let me know we can get an x-ray.   Patellofemoral Pain Syndrome  Patellofemoral pain syndrome is a condition in which the tissue (cartilage) on the underside of the kneecap (patella) softens or breaks down. This causes pain in the front of the knee. The condition is also called runner's knee or chondromalacia patella. Patellofemoral pain syndrome is most common in young adults who are active in sports. The knee is the largest joint in the body. The patella covers the front of the knee and is attached to muscles above and below the knee. The underside of the patella is covered with a smooth type of cartilage (synovium). The smooth surface helps the patella glide easily when you move your knee. Patellofemoral pain syndrome causes swelling in the joint linings and bone surfaces in the knee. What are the causes? This condition may be caused by: Overuse of the knee. Poor alignment of your knee joints. Weak leg muscles. A direct hit to your kneecap. What increases the risk? You are more likely to develop this condition if: You do a lot of activities that can wear down your kneecap. These include: Running. Squatting. Climbing stairs. You start a new physical activity or exercise program. You wear shoes that do not fit well. You do not have good leg strength. You are overweight. What are the signs or symptoms? The main symptom of this condition is knee pain. This may feel like a dull, aching pain underneath your patella, in the front of your knee. There may be a popping or cracking sound when you move your knee. Pain may get worse with: Exercise. Climbing stairs. Running. Jumping. Squatting. Kneeling. Sitting for a long time. Moving or pushing on your patella. How is this diagnosed? This condition may be diagnosed based on: Your symptoms and medical history. You may be asked about your recent physical activities and which ones cause knee  pain. A physical exam. This may include: Moving your patella back and forth. Checking your range of knee motion. Having you squat or jump to see if you have pain. Checking the strength of your leg muscles. Imaging tests to confirm the diagnosis. These may include an MRI of your knee. How is this treated? This condition may be treated at home with rest, ice, compression, and elevation (RICE).  Other treatments may include: NSAIDs, such as ibuprofen. Physical therapy to stretch and strengthen your leg muscles. Shoe inserts (orthotics) to take stress off your knee. A knee brace or knee support. Adhesive tapes to the skin. Surgery to remove damaged cartilage or move the patella to a better position. This is rare. Follow these instructions at home: If you have a brace: Wear the brace as told by your health care provider. Remove it only as told by your health care provider. Loosen the brace if your toes tingle, become numb, or turn cold and blue. Keep the brace clean. If the brace is not waterproof: Do not let it get wet. Cover it with a watertight covering when you take a bath or a shower. Managing pain, stiffness, and swelling  If directed, put ice on the painful area. To do this: If you have a removable brace, remove it as told by your health care provider. Put ice in a plastic bag. Place a towel between your skin and the bag. Leave the ice on for 20 minutes, 2-3 times a day. Remove the ice if your skin turns bright red. This is very  important. If you cannot feel pain, heat, or cold, you have a greater risk of damage to the area. Move your toes often to reduce stiffness and swelling. Raise (elevate) the injured area above the level of your heart while you are sitting or lying down. Activity Rest your knee. Avoid activities that cause knee pain. Perform stretching and strengthening exercises as told by your health care provider or physical therapist. Return to your normal activities  as told by your health care provider. Ask your health care provider what activities are safe for you. General instructions Take over-the-counter and prescription medicines only as told by your health care provider. Use splints, braces, knee supports, or walking aids as directed by your health care provider. Do not use any products that contain nicotine or tobacco, such as cigarettes, e-cigarettes, and chewing tobacco. These can delay healing. If you need help quitting, ask your health care provider. Keep all follow-up visits. This is important. Contact a health care provider if: Your symptoms get worse. You are not improving with home care. Summary Patellofemoral pain syndrome is a condition in which the tissue (cartilage) on the underside of the kneecap (patella) softens or breaks down. This condition causes swelling in the joint linings and bone surfaces in the knee. This leads to pain in the front of the knee. This condition may be treated at home with rest, ice, compression, and elevation (RICE). Use splints, braces, knee supports, or walking aids as directed by your health care provider. This information is not intended to replace advice given to you by your health care provider. Make sure you discuss any questions you have with your health care provider. Document Revised: 10/12/2019 Document Reviewed: 10/12/2019 Elsevier Patient Education  2023 [employer].

## 2021-10-11 NOTE — Progress Notes (Signed)
Tommi Rumps, MD Phone: [phone]  Allison [name] is a 36 y.o. female who presents today for same-day visit.  Right knee pain: Patient reports 2 weeks ago she tripped going down a couple of steps and hit the door with her right knee.  She has had some knee pain since then though notes she has not had any pain today.  She notes the pain occurs when she gets up from seated positions or when she goes up or down stairs.  She noted no head injury.  The knee does not give out on her, lock up on her, or pop.  Social History   Tobacco Use  Smoking Status Every Day   Packs/day: 1.00   Years: 10.00   Pack years: 10.00   Types: Cigarettes   Start date: 05/14/2006  Smokeless Tobacco Never    Current Outpatient Medications on File Prior to Visit  Medication Sig Dispense Refill   adapalene (DIFFERIN) 0.1 % cream Apply topically at bedtime. 45 g 0   amLODipine (NORVASC) 10 MG tablet Take 1 tablet by mouth once daily 90 tablet 0   blood glucose meter kit and supplies KIT Dispense based on patient and insurance preference. Use 3 times daily as directed. (FOR ICD-10 E11.9). 1 each 0   Dextromethorphan-guaiFENesin 5-100 MG/5ML LIQD Take 10 mLs by mouth at bedtime as needed (for cough). 70 mL 0   fluticasone (FLONASE) 50 MCG/ACT nasal spray Place 2 sprays into both nostrils daily. 16 g 6   glucose blood test strip Use as instructed 100 each 11   insulin glargine (LANTUS SOLOSTAR) 100 UNIT/ML Solostar Pen Inject 40 Units into the skin at bedtime. 30 mL 2   lisinopril (ZESTRIL) 40 MG tablet Take 1 tablet (40 mg total) by mouth daily. 90 tablet 1   meloxicam (MOBIC) 15 MG tablet Take 1 tablet (15 mg total) by mouth daily. 30 tablet 0   omeprazole (PRILOSEC) 20 MG capsule Take 1 capsule (20 mg total) by mouth daily. 30 capsule 0   ondansetron (ZOFRAN-ODT) 4 MG disintegrating tablet      oxyCODONE (OXY IR/ROXICODONE) 5 MG immediate release tablet Take 5 mg by mouth every 4 (four) hours.      rosuvastatin (CRESTOR) 40 MG tablet Take 1 tablet by mouth once daily 90 tablet 0   SEMGLEE, YFGN, 100 UNIT/ML Pen SMARTSIG:40 Unit(s) SUB-Q Every Night     sertraline (ZOLOFT) 50 MG tablet Take 1 tablet (50 mg total) by mouth daily. 30 tablet 3   Vitamin D, Ergocalciferol, (DRISDOL) 1.25 MG (50000 UT) CAPS capsule Take 1 capsule (50,000 Units total) by mouth 2 (two) times a week. 30 capsule 2   No current facility-administered medications on file prior to visit.     ROS see history of present illness  Objective  Physical Exam Vitals:   10/11/21 1341  BP: 110/70  Pulse: 96  Temp: 98.1 F (36.7 C)  SpO2: 97%    BP Readings from Last 3 Encounters:  10/11/21 110/70  07/16/21 130/70  02/11/21 138/78   Wt Readings from Last 3 Encounters:  10/11/21 197 lb (89.4 kg)  07/16/21 202 lb 6.4 oz (91.8 kg)  02/11/21 203 lb 4 oz (92.2 kg)    Physical Exam Musculoskeletal:     Comments: Bilateral knees with no warmth, swelling, erythema, or tenderness, right knee with negative McMurray's, 5/5 right quad strength     Assessment/Plan: Please see individual problem list.  Problem List Items Addressed This Visit  Right knee pain    Possibly related to her injury or patellofemoral pain syndrome.  Her symptoms have resolved at this time.  Discussed monitoring if it recurs she will let us know and we can get an x-ray and provide her with exercises.       Type 2 diabetes mellitus without complication, with long-term current use of insulin (HCC)   Relevant Medications   Semaglutide, 1 MG/DOSE, (OZEMPIC, 1 MG/DOSE,) 4 MG/3ML SOPN     Return if symptoms worsen or fail to improve.   Tommi Rumps, MD [place]

## 2021-10-11 NOTE — Assessment & Plan Note (Signed)
Possibly related to her injury or patellofemoral pain syndrome.  Her symptoms have resolved at this time.  Discussed monitoring if it recurs she will let us know and we can get an x-ray and provide her with exercises.

## 2021-10-22 ENCOUNTER — Ambulatory Visit: Payer: BC Managed Care – PPO | Admitting: Family Medicine

## 2021-10-29 ENCOUNTER — Encounter: Payer: Self-pay | Admitting: Family Medicine

## 2021-10-29 ENCOUNTER — Ambulatory Visit (INDEPENDENT_AMBULATORY_CARE_PROVIDER_SITE_OTHER): Payer: BC Managed Care – PPO | Admitting: Family Medicine

## 2021-10-29 VITALS — BP 120/70 | HR 86 | Temp 98.4°F | Ht 61.0 in | Wt 197.6 lb

## 2021-10-29 DIAGNOSIS — Z794 Long term (current) use of insulin: Secondary | ICD-10-CM

## 2021-10-29 DIAGNOSIS — F320 Major depressive disorder, single episode, mild: Secondary | ICD-10-CM | POA: Diagnosis not present

## 2021-10-29 DIAGNOSIS — Z1159 Encounter for screening for other viral diseases: Secondary | ICD-10-CM | POA: Diagnosis not present

## 2021-10-29 DIAGNOSIS — E785 Hyperlipidemia, unspecified: Secondary | ICD-10-CM

## 2021-10-29 DIAGNOSIS — E119 Type 2 diabetes mellitus without complications: Secondary | ICD-10-CM | POA: Diagnosis not present

## 2021-10-29 DIAGNOSIS — S81801A Unspecified open wound, right lower leg, initial encounter: Secondary | ICD-10-CM | POA: Insufficient documentation

## 2021-10-29 DIAGNOSIS — Z23 Encounter for immunization: Secondary | ICD-10-CM

## 2021-10-29 DIAGNOSIS — Z6841 Body Mass Index (BMI) 40.0 and over, adult: Secondary | ICD-10-CM

## 2021-10-29 LAB — LIPID PANEL
Cholesterol: 109 mg/dL (ref 0–200)
HDL: 32.1 mg/dL — ABNORMAL LOW (ref >39.00)
NonHDL: 77.21
Total CHOL/HDL Ratio: 3
Triglycerides: 252 mg/dL — ABNORMAL HIGH (ref 0.0–149.0)
VLDL: 50.4 mg/dL — ABNORMAL HIGH (ref 0.0–40.0)

## 2021-10-29 LAB — BASIC METABOLIC PANEL
BUN: 14 mg/dL (ref 6–23)
CO2: 25 mEq/L (ref 19–32)
Calcium: 9.3 mg/dL (ref 8.4–10.5)
Chloride: 103 mEq/L (ref 96–112)
Creatinine, Ser: 0.73 mg/dL (ref 0.40–1.20)
GFR: 105.87 mL/min (ref >60.00)
Glucose, Bld: 138 mg/dL — ABNORMAL HIGH (ref 70–99)
Potassium: 4.2 mEq/L (ref 3.5–5.1)
Sodium: 137 mEq/L (ref 135–145)

## 2021-10-29 LAB — LDL CHOLESTEROL, DIRECT: Direct LDL: 50 mg/dL

## 2021-10-29 LAB — HEMOGLOBIN A1C: Hgb A1c MFr Bld: 7.7 % — ABNORMAL HIGH (ref 4.6–6.5)

## 2021-10-29 NOTE — Assessment & Plan Note (Signed)
Poorly controlled.  We will check an A1c.  Pending her A1c she will continue on Ozempic 1 mg daily and Semglee 40 units daily.  She will get back to working on diet and exercise.

## 2021-10-29 NOTE — Addendum Note (Signed)
Addended by: Warden Fillers on: 10/29/2021 09:46 AM   Modules accepted: Orders

## 2021-10-29 NOTE — Assessment & Plan Note (Addendum)
This occurred more than 2 weeks ago.  They do not appear infected at this time. She will monitor for signs of infection.  If they appear infected she will contact us immediately.  Discussed that her diabetes is likely resulting in them being slower healing. Td brought up to date.

## 2021-10-29 NOTE — Patient Instructions (Addendum)
Nice to see you. We will get lab work today and let you know what the results are. Please get back to working on your diet and exercise. If you develop any spreading redness, increasing swelling, drainage, fevers, or any other symptoms related to the wounds on your legs please let us know immediately.

## 2021-10-29 NOTE — Assessment & Plan Note (Signed)
Check labs.  Continue Crestor 40 mg daily.

## 2021-10-29 NOTE — Assessment & Plan Note (Signed)
Encouraged getting back to healthy diet and activity levels.

## 2021-10-29 NOTE — Addendum Note (Signed)
Addended by: Charlyne Mom D on: 10/29/2021 09:37 AM   Modules accepted: Orders

## 2021-10-29 NOTE — Progress Notes (Signed)
Allison Rumps, MD Phone: [phone]  Allison [name] is a 36 y.o. female who presents today for f/u.  DIABETES Disease Monitoring: Blood Sugar ranges-170s-215 though has not been checking consistently due to personal issues Polyuria/phagia/dipsia- no      Optho- UTD Medications: Compliance- taking ozempic 1 mg weekly and semglee 40 u daily Hypoglycemic symptoms- no Notes she was in a good routine with her diet and exercise then had some personal issues over the past 6 weeks that made it more difficult to deal with her diabetes.  She reports the nausea that she had with the Ozempic has improved significantly.  She notes she was eating too much and since she has decreased her meal sizes and nausea has improved.  HYPERLIPIDEMIA Symptoms Chest pain on exertion:  no   Leg claudication:   no Medications: Compliance- taking crestor Right upper quadrant pain- no  Muscle aches- no Lipid Panel     Component Value Date/Time   CHOL 140 10/12/2020 1500   TRIG 519 (H) 10/12/2020 1500   HDL 22 (L) 10/12/2020 1500   CHOLHDL 6.4 (H) 10/12/2020 1500   LDLCALC 43 10/12/2020 1500   LABVLDL 75 (H) 10/12/2020 1500   Depression: Patient notes she is doing okay with this.  She notes she has had some marital issues and does get overwhelmed and cries a lot.  She continues on Zoloft.  No SI or HI.  Wounds: Patient notes she was attacked by a rooster after her last visit. She notes they were puncture wounds. Notes that wounds are taking a while to heal up.  She is not up-to-date on her tetanus shot.   Social History   Tobacco Use  Smoking Status Every Day   Packs/day: 1.00   Years: 10.00   Total pack years: 10.00   Types: Cigarettes   Start date: 05/14/2006  Smokeless Tobacco Never    Current Outpatient Medications on File Prior to Visit  Medication Sig Dispense Refill   amLODipine (NORVASC) 10 MG tablet Take 1 tablet by mouth once daily 90 tablet 0   blood glucose meter kit and supplies  KIT Dispense based on patient and insurance preference. Use 3 times daily as directed. (FOR ICD-10 E11.9). 1 each 0   glucose blood test strip Use as instructed 100 each 11   lisinopril (ZESTRIL) 40 MG tablet Take 1 tablet (40 mg total) by mouth daily. 90 tablet 1   rosuvastatin (CRESTOR) 40 MG tablet Take 1 tablet by mouth once daily 90 tablet 0   Semaglutide, 1 MG/DOSE, (OZEMPIC, 1 MG/DOSE,) 4 MG/3ML SOPN Inject 1 mg into the skin once a week. 9 mL 3   SEMGLEE, YFGN, 100 UNIT/ML Pen SMARTSIG:40 Unit(s) SUB-Q Every Night     sertraline (ZOLOFT) 50 MG tablet Take 1 tablet (50 mg total) by mouth daily. 30 tablet 3   Vitamin D, Ergocalciferol, (DRISDOL) 1.25 MG (50000 UT) CAPS capsule Take 1 capsule (50,000 Units total) by mouth 2 (two) times a week. 30 capsule 2   No current facility-administered medications on file prior to visit.     ROS see history of present illness  Objective  Physical Exam Vitals:   10/29/21 0900  BP: 120/70  Pulse: 86  Temp: 98.4 F (36.9 C)  SpO2: 98%    BP Readings from Last 3 Encounters:  10/29/21 120/70  10/11/21 110/70  07/16/21 130/70   Wt Readings from Last 3 Encounters:  10/29/21 197 lb 9.6 oz (89.6 kg)  10/11/21 197 lb (89.4  kg)  07/16/21 202 lb 6.4 oz (91.8 kg)    Physical Exam Constitutional:      General: She is not in acute distress.    Appearance: She is not diaphoretic.  Cardiovascular:     Rate and Rhythm: Normal rate and regular rhythm.     Heart sounds: Normal heart sounds.  Pulmonary:     Effort: Pulmonary effort is normal.     Breath sounds: Normal breath sounds.  Skin:    General: Skin is warm and dry.     Comments: 5 scattered wounds that have scabs or are healed on her tight lower extremity, one anteriorly and posteriorly on her lower leg are slightly tender, none have any surrounding erythema or drainage  Neurological:     Mental Status: She is alert.   [name], CMA served as chaperone for examination of her  legs   Assessment/Plan: Please see individual problem list.  Problem List Items Addressed This Visit     Depression, major, single episode, mild (HCC) (Chronic)    Generally stable.  Discussed the potential for increasing her Zoloft though she opts to remain on 50 mg daily.  I did discuss having her do therapy if she felt as though that would be beneficial.  Discussed the option of hydroxyzine though she did not want anything that could make her drowsy.      HLD (hyperlipidemia) (Chronic)    Check labs.  Continue Crestor 40 mg daily.      Relevant Orders   Lipid panel   Type 2 diabetes mellitus without complication, with long-term current use of insulin (HCC) (Chronic)    Poorly controlled.  We will check an A1c.  Pending her A1c she will continue on Ozempic 1 mg daily and Semglee 40 units daily.  She will get back to working on diet and exercise.      Relevant Orders   HgB J4H   Basic Metabolic Panel (BMET)   Morbid obesity with BMI of 40.0-44.9, adult ([place])    Encouraged getting back to healthy diet and activity levels.      Wound of right leg    This occurred more than 2 weeks ago.  They do not appear infected at this time. She will monitor for signs of infection.  If they appear infected she will contact us immediately.  Discussed that her diabetes is likely resulting in them being slower healing. Td brought up to date.       Other Visit Diagnoses     Need for hepatitis C screening test    -  Primary   Relevant Orders   Hepatitis C Antibody        Health Maintenance: Td given today. Hep C screening completed today.   Return in about 3 months (around 01/29/2022) for Diabetes.   Allison Rumps, MD [place]

## 2021-10-29 NOTE — Assessment & Plan Note (Signed)
Generally stable.  Discussed the potential for increasing her Zoloft though she opts to remain on 50 mg daily.  I did discuss having her do therapy if she felt as though that would be beneficial.  Discussed the option of hydroxyzine though she did not want anything that could make her drowsy.

## 2021-10-30 ENCOUNTER — Other Ambulatory Visit: Payer: Self-pay

## 2021-10-30 DIAGNOSIS — E119 Type 2 diabetes mellitus without complications: Secondary | ICD-10-CM

## 2021-10-30 LAB — HEPATITIS C ANTIBODY: Hepatitis C Ab: NONREACTIVE

## 2021-10-30 MED ORDER — SEMAGLUTIDE (2 MG/DOSE) 8 MG/3ML ~~LOC~~ SOPN: 2.0000 mg | PEN_INJECTOR | SUBCUTANEOUS | 3 refills | Status: DC

## 2021-11-07 ENCOUNTER — Encounter: Payer: Self-pay | Admitting: Obstetrics and Gynecology

## 2021-11-07 ENCOUNTER — Ambulatory Visit (INDEPENDENT_AMBULATORY_CARE_PROVIDER_SITE_OTHER): Payer: BC Managed Care – PPO | Admitting: Obstetrics and Gynecology

## 2021-11-07 VITALS — BP 132/80 | HR 83 | Resp 16 | Ht 61.0 in | Wt 198.6 lb

## 2021-11-07 DIAGNOSIS — F419 Anxiety disorder, unspecified: Secondary | ICD-10-CM

## 2021-11-07 DIAGNOSIS — Z01419 Encounter for gynecological examination (general) (routine) without abnormal findings: Secondary | ICD-10-CM

## 2021-11-07 DIAGNOSIS — Z803 Family history of malignant neoplasm of breast: Secondary | ICD-10-CM | POA: Insufficient documentation

## 2021-11-07 DIAGNOSIS — I10 Essential (primary) hypertension: Secondary | ICD-10-CM

## 2021-11-07 DIAGNOSIS — E785 Hyperlipidemia, unspecified: Secondary | ICD-10-CM

## 2021-11-07 DIAGNOSIS — Z01411 Encounter for gynecological examination (general) (routine) with abnormal findings: Secondary | ICD-10-CM

## 2021-11-07 DIAGNOSIS — N946 Dysmenorrhea, unspecified: Secondary | ICD-10-CM | POA: Diagnosis not present

## 2021-11-07 DIAGNOSIS — E119 Type 2 diabetes mellitus without complications: Secondary | ICD-10-CM

## 2021-11-07 MED ORDER — IBUPROFEN 800 MG PO TABS: 800.0000 mg | ORAL_TABLET | Freq: Three times a day (TID) | ORAL | 1 refills | Status: DC | PRN

## 2021-11-07 NOTE — Patient Instructions (Addendum)

## 2021-11-07 NOTE — Progress Notes (Signed)
GYNECOLOGY ANNUAL PHYSICAL EXAM PROGRESS NOTE  Subjective:    Allison [name] is a 36 y.o. G2P2 female who presents to re-establish care, and for an annual exam. She has a PMH of HTN, Dyslipidemia, DM, Depression/Anxiety. She was last seen by Allison [name] CNM in 2020.The patient has no complaints today. The patient is sexually active. The patient participates in regular exercise: yes. Has the patient ever been transfused or tattooed?: yes. The patient reports that there is not domestic violence in her life.     Menstrual History: Menarche age: 89 Patient's last menstrual period was 10/05/2021 (exact date). Period Cycle (Days): -2 Period Duration (Days): 5 Period Pattern: Regular Menstrual Flow: Heavy Menstrual Control: Maxi pad Menstrual Control Change Freq (Hours): 1-2 Dysmenorrhea: (!) Severe- Takes midol with minimal relief.  Dysmenorrhea Symptoms: Cramping, Nausea  Gynecologic History:  Contraception: tubal ligation History of STI's: Denies Last Pap: 05/15/2015. Results were: normal.  Denies h/o abnormal pap smears.   OB History  Gravida Para Term Preterm AB Living  2 2 0 0 0 0  SAB IAB Ectopic Multiple Live Births  0 0 0 0 0    # Outcome Date GA Lbr Len/2nd Weight Sex Delivery Anes PTL Lv  2 Para 2013    M Vag-Spont     1 Para 2003    F Vag-Spont       Past Medical History:  Diagnosis Date   Anxiety    Depression    Diabetes mellitus without complication (HCC)    Hypertension     Past Surgical History:  Procedure Laterality Date   CHOLECYSTECTOMY  2003   TUBAL LIGATION      Family History  Problem Relation Age of Onset   Cancer Mother 51       breast cancer   Diabetes Mother    Multiple sclerosis Father 46       Progressive - not diagnosed until age 38   Diabetes Paternal Aunt    Diabetes Maternal Grandfather    Diabetes Paternal Aunt     Social History   Socioeconomic History   Marital status: Married    Spouse name: Allison [name]    Number of children: 2   Years of education: 14   Highest education level: Not on file  Occupational History   Occupation: Physical Department/Environmental Services    Employer: [employer]  Tobacco Use   Smoking status: Every Day    Packs/day: 1.00    Years: 10.00    Total pack years: 10.00    Types: Cigarettes    Start date: 05/14/2006   Smokeless tobacco: Never  Vaping Use   Vaping Use: Never used  Substance and Sexual Activity   Alcohol use: Yes    Alcohol/week: 0.0 standard drinks of alcohol    Comment: Rare    Drug use: No   Sexual activity: Yes    Birth control/protection: Surgical  Other Topics Concern   Not on file  Social History Narrative   Allison [name] grew up in [place], [place]. She is married and has 2 children. She enjoys spending time with her family. She enjoys reading and shopping.       Smokes < 10 cigarettes daily - trying to quit   Caffeine - drinks 3 cans of diet pepsi daily   Alcohol - rare   Social Determinants of Health   Financial Resource Strain: Low Risk  (04/26/2021)   Overall Financial Resource Strain (CARDIA)    Difficulty of Paying Living  Expenses: Not hard at all  Food Insecurity: Not on file  Transportation Needs: Not on file  Physical Activity: Not on file  Stress: Not on file  Social Connections: Not on file  Intimate Partner Violence: Not on file    Current Outpatient Medications on File Prior to Visit  Medication Sig Dispense Refill   amLODipine (NORVASC) 10 MG tablet Take 1 tablet by mouth once daily 90 tablet 0   blood glucose meter kit and supplies KIT Dispense based on patient and insurance preference. Use 3 times daily as directed. (FOR ICD-10 E11.9). 1 each 0   glucose blood test strip Use as instructed 100 each 11   lisinopril (ZESTRIL) 40 MG tablet Take 1 tablet (40 mg total) by mouth daily. 90 tablet 1   rosuvastatin (CRESTOR) 40 MG tablet Take 1 tablet by mouth once daily 90 tablet 0   Semaglutide, 2 MG/DOSE, 8 MG/3ML SOPN  Inject 2 mg as directed once a week. 3 mL 3   SEMGLEE, YFGN, 100 UNIT/ML Pen SMARTSIG:40 Unit(s) SUB-Q Every Night     sertraline (ZOLOFT) 50 MG tablet Take 1 tablet (50 mg total) by mouth daily. 30 tablet 3   Vitamin D, Ergocalciferol, (DRISDOL) 1.25 MG (50000 UT) CAPS capsule Take 1 capsule (50,000 Units total) by mouth 2 (two) times a week. 30 capsule 2   No current facility-administered medications on file prior to visit.    Allergies  Allergen Reactions   Metformin And Related Nausea And Vomiting    Diarrhea, nausea/vomiting   Jardiance [Empagliflozin] Other (See Comments)    Recurrent yeast infections    Review of Systems Constitutional: negative for chills, fatigue, fevers and sweats Eyes: negative for irritation, redness and visual disturbance Ears, nose, mouth, throat, and face: negative for hearing loss, nasal congestion, snoring and tinnitus Respiratory: negative for asthma, cough, sputum Cardiovascular: negative for chest pain, dyspnea, exertional chest pressure/discomfort, irregular heart beat, palpitations and syncope Gastrointestinal: negative for abdominal pain, change in bowel habits, nausea and vomiting Genitourinary: negative for abnormal menstrual periods, genital lesions, sexual problems and vaginal discharge, dysuria and urinary incontinence Integument/breast: negative for breast lump, breast tenderness and nipple discharge Hematologic/lymphatic: negative for bleeding and easy bruising Musculoskeletal:negative for back pain and muscle weakness Neurological: negative for dizziness, headaches, vertigo and weakness Endocrine: negative for diabetic symptoms including polydipsia, polyuria and skin dryness Allergic/Immunologic: negative for hay fever and urticaria      Objective:  Blood pressure 132/80, pulse 83, resp. rate 16, height $RemoveBe'5\' 1"'PFnUuNRwN$  (1.549 m), weight 198 lb 9.6 oz (90.1 kg), last menstrual period 10/05/2021. Body mass index is 37.53 kg/m.  General  Appearance:    Alert, cooperative, no distress, appears stated age, obesity  Head:    Normocephalic, without obvious abnormality, atraumatic  Eyes:    PERRL, conjunctiva/corneas clear, EOM's intact, both eyes  Ears:    Normal external ear canals, both ears  Nose:   Nares normal, septum midline, mucosa normal, no drainage or sinus tenderness  Throat:   Lips, mucosa, and tongue normal; teeth and gums normal  Neck:   Supple, symmetrical, trachea midline, no adenopathy; thyroid: no enlargement/tenderness/nodules; no carotid bruit or JVD  Back:     Symmetric, no curvature, ROM normal, no CVA tenderness  Lungs:     Clear to auscultation bilaterally, respirations unlabored  Chest Wall:    No tenderness or deformity   Heart:    Regular rate and rhythm, S1 and S2 normal, no murmur, rub or gallop  Breast  Exam:    No tenderness, masses, or nipple abnormality  Abdomen:     Soft, non-tender, bowel sounds active all four quadrants, no masses, no organomegaly.    Genitalia:   Deferred by patient as she is on menstrual cycle  Rectal:    Deferred   Extremities:   Extremities normal, atraumatic, no cyanosis or edema  Pulses:   2+ and symmetric all extremities  Skin:   Skin color, texture, turgor normal, no rashes or lesions  Lymph nodes:   Cervical, supraclavicular, and axillary nodes normal  Neurologic:   CNII-XII intact, normal strength, sensation and reflexes throughout   .  Labs:  Performed with PCP.    Assessment:   1. Encounter for well woman exam with routine gynecological exam   2. Cervical cancer screening   3. Dysmenorrhea   4. Type 2 diabetes mellitus without complication, with long-term current use of insulin ([place])   5. Anxiety and depression   6. Dyslipidemia   7. Benign essential HTN      Plan:   1. Encounter for well woman exam with routine gynecological exam - Blood tests: UTD had drawn last week by PCP. - Breast self exam technique reviewed and patient encouraged to perform  self-exam monthly. - Contraception: tubal ligation. - Discussed healthy lifestyle modifications. - Mammogram  : Not age appropriate . Would recommend to begin screenings at age 46 based on family history of breast cancer in mom at age 49.  - Pap smear ordered for next visit, exam deferred today as patient on menses. Will return in 3 weeks for pap smear only.  2. Dysmenorrhea - Prescribed Ibuprofen 800 mg for severe dysmenorrhea.   3. Type 2 diabetes mellitus without complication, with long-term current use of insulin (HCC) - DM, currently on Ozempic. Notes A1c has improved from ~ 10.7 down to 7.8.   4. Anxiety and depression - Anxiety and depression, currently on Zoloft, managed by PCP, doing well.   5. Dyslipidemia - Dyslipidemia managed by PCP, currently on a statin. Notes levels are doing well.   6. Benign essential HTN - HTN, currently on Amlodipine, managed by PCP, doing well.   7. Family history of breast cancer in mother - Discussed hereditary cancer screening due to family history, patient ok to perform. Informed of risks vs benefits of screen, cost expectations, etc. [facility] panel performed today.    Follow up in 1 year for annual exam. RTC in 3 weeks for pap smear only.    Rubie Maid, MD [facility]

## 2021-11-27 NOTE — Progress Notes (Deleted)
    GYNECOLOGY PROGRESS NOTE  Subjective:    Patient ID: Allison [name], female    DOB: Apr 11, 1986, 36 y.o.   MRN: [id number]  HPI  Patient is a 36 y.o. G2P2 female who presents for  pap smear only  The following portions of the patient's history were reviewed and updated as appropriate: allergies, current medications, past family history, past medical history, past social history, past surgical history, and problem list.  Review of Systems Pertinent items are noted in HPI.   Objective:   Last menstrual period 10/05/2021. There is no height or weight on file to calculate BMI. General appearance: alert, cooperative, and no distress Abdomen: {abdominal exam:16834} Pelvic: {pelvic exam:16852::"cervix normal in appearance","external genitalia normal","no adnexal masses or tenderness","no cervical motion tenderness","rectovaginal septum normal","uterus normal size, shape, and consistency","vagina normal without discharge"} Extremities: {extremity exam:5109} Neurologic: {neuro exam:17854}   Assessment:   No diagnosis found.   Plan:   There are no diagnoses linked to this encounter.

## 2021-11-28 ENCOUNTER — Encounter: Payer: BC Managed Care – PPO | Admitting: Obstetrics and Gynecology

## 2021-12-05 ENCOUNTER — Telehealth: Payer: Self-pay | Admitting: Family Medicine

## 2021-12-05 DIAGNOSIS — F320 Major depressive disorder, single episode, mild: Secondary | ICD-10-CM

## 2021-12-05 MED ORDER — SERTRALINE HCL 50 MG PO TABS: 50.0000 mg | ORAL_TABLET | Freq: Every day | ORAL | 1 refills | Status: DC

## 2021-12-05 NOTE — Telephone Encounter (Signed)
Pt called in requesting for refill on medication (sertraline (ZOLOFT) 50 MG tablet)... Pt requesting callback

## 2021-12-05 NOTE — Telephone Encounter (Signed)
Sent to pharmacy 

## 2021-12-11 ENCOUNTER — Encounter: Payer: BC Managed Care – PPO | Admitting: Obstetrics and Gynecology

## 2021-12-22 ENCOUNTER — Other Ambulatory Visit: Payer: Self-pay | Admitting: Family Medicine

## 2021-12-22 DIAGNOSIS — I10 Essential (primary) hypertension: Secondary | ICD-10-CM

## 2021-12-24 ENCOUNTER — Ambulatory Visit (INDEPENDENT_AMBULATORY_CARE_PROVIDER_SITE_OTHER): Payer: BC Managed Care – PPO | Admitting: Obstetrics and Gynecology

## 2021-12-24 ENCOUNTER — Other Ambulatory Visit (HOSPITAL_COMMUNITY)
Admission: RE | Admit: 2021-12-24 | Discharge: 2021-12-24 | Disposition: A | Discharge status: Home / Self Care | Payer: BC Managed Care – PPO | Source: Ambulatory Visit | Attending: Obstetrics and Gynecology | Admitting: Obstetrics and Gynecology

## 2021-12-24 ENCOUNTER — Encounter: Payer: Self-pay | Admitting: Obstetrics and Gynecology

## 2021-12-24 VITALS — BP 140/84 | HR 70 | Resp 16 | Ht 61.0 in | Wt 197.9 lb

## 2021-12-24 DIAGNOSIS — Z124 Encounter for screening for malignant neoplasm of cervix: Secondary | ICD-10-CM | POA: Insufficient documentation

## 2021-12-24 DIAGNOSIS — Z1379 Encounter for other screening for genetic and chromosomal anomalies: Secondary | ICD-10-CM | POA: Insufficient documentation

## 2021-12-24 NOTE — Progress Notes (Signed)
   Subjective:     [facility] Bartolomei is a 36 y.o. woman who comes in today for a  pap smear only. Her most recent annual exam was on 11/07/2021, pap smear was deferred due to being on menstrual cycle during visit.  Her last pap was in 05/2015, was normal. Previous abnormal Pap smears: no.   Additionally, patient also had recent hereditary genetic screening performed due to maternal history of breast cancer. Results have returned. Has a separate visit scheduled, however can review results today.   The following portions of the patient's history were reviewed and updated as appropriate: allergies, current medications, past family history, past medical history, past social history, past surgical history, and problem list.  Review of Systems A comprehensive review of systems was negative.   Objective:    BP (!) 140/84   Pulse 70   Resp 16   Ht _0  (1.549 m)   Wt 197 lb 14.4 oz (89.8 kg)   BMI 37.39 kg/m  Pelvic Exam: cervix normal in appearance, external genitalia normal, and vagina normal without discharge. Pap smear obtained.   Assessment:    Screening pap smear.  Family history of breast cancer  Plan:  - Reviewed hereditary genetic screening results ([facility]).  Negative for BCA1/2 panel. 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 .  Discussed that not all variants present in a gene can cause disease.  Can continue to monitor at this time as patient with no personal or family history of colon cancer or polyps.  - Return to clinic for any scheduled appointments or for any gynecologic concerns as needed, or as indicated by Pap results.  A total of 30 minutes were spent during this encounter, including review of previous progress notes, recent labs, face-to-face with time with patient involving counseling and coordination of care, as well  as documentation for current visit.   Rubie Maid, MD [facility]

## 2021-12-26 LAB — CYTOLOGY - PAP
Comment: NEGATIVE
Diagnosis: NEGATIVE
High risk HPV: NEGATIVE

## 2022-01-02 ENCOUNTER — Telehealth: Payer: BC Managed Care – PPO | Admitting: Obstetrics and Gynecology

## 2022-01-18 ENCOUNTER — Ambulatory Visit
Admission: EM | Admit: 2022-01-18 | Discharge: 2022-01-18 | Disposition: A | Discharge status: Home / Self Care | Payer: BC Managed Care – PPO | Attending: Emergency Medicine | Admitting: Emergency Medicine

## 2022-01-18 DIAGNOSIS — E1165 Type 2 diabetes mellitus with hyperglycemia: Secondary | ICD-10-CM | POA: Diagnosis not present

## 2022-01-18 DIAGNOSIS — R1032 Left lower quadrant pain: Secondary | ICD-10-CM | POA: Diagnosis not present

## 2022-01-18 DIAGNOSIS — Z794 Long term (current) use of insulin: Secondary | ICD-10-CM | POA: Diagnosis not present

## 2022-01-18 DIAGNOSIS — R300 Dysuria: Secondary | ICD-10-CM | POA: Diagnosis not present

## 2022-01-18 DIAGNOSIS — N390 Urinary tract infection, site not specified: Secondary | ICD-10-CM | POA: Diagnosis not present

## 2022-01-18 LAB — POCT URINALYSIS DIP (MANUAL ENTRY)
Bilirubin, UA: NEGATIVE
Glucose, UA: >=1000 mg/dL — AB
Ketones, POC UA: NEGATIVE mg/dL
Nitrite, UA: POSITIVE — AB
Protein Ur, POC: 30 mg/dL — AB
Spec Grav, UA: <=1.005 — AB (ref 1.010–1.025)
Urobilinogen, UA: 0.2 E.U./dL
pH, UA: 5 (ref 5.0–8.0)

## 2022-01-18 LAB — POCT FASTING CBG KUC MANUAL ENTRY: POCT Glucose (KUC): 282 mg/dL — AB (ref 70–99)

## 2022-01-18 LAB — POCT URINE PREGNANCY: Preg Test, Ur: NEGATIVE

## 2022-01-18 MED ORDER — CEPHALEXIN 500 MG PO CAPS: 500.0000 mg | ORAL_CAPSULE | Freq: Two times a day (BID) | ORAL | 0 refills | Status: AC

## 2022-01-18 NOTE — Discharge Instructions (Addendum)
Take the antibiotic as directed.  The urine culture is pending.  We will call you if it shows the need to change or discontinue your antibiotic.    Monitor for your blood sugar before meals and at bedtime this weekend.    Go to the emergency department if your high blood sugar levels continue or if your abdominal pain continues or you have other concerning symptoms.    Follow-up with your primary care provider on Monday.

## 2022-01-18 NOTE — ED Triage Notes (Signed)
Pt presents with dysuria, urinary frequency and urgency x 5 days. She also has left side groin pain.

## 2022-01-18 NOTE — ED Provider Notes (Signed)
Allison [name]    CSN: [id number] Arrival date & time: 01/18/22  1330      History   Chief Complaint Chief Complaint  Patient presents with   Dysuria   Urinary Frequency    HPI Allison [name] is a 36 y.o. female.  Accompanied by her husband, patient presents with dysuria, urinary frequency, urinary urgency x5 days.  She also reports left lower quadrant abdominal pain which she describes as mild cramping.  No fever, chills, nausea, vomiting, diarrhea, constipation, hematuria, vaginal discharge, pelvic pain, or other symptoms.  No treatments at home.  Her medical history includes diabetes, hypertension, morbid obesity.  Patient has not had her Ozempic in 2 weeks because the pharmacy is out of it.   The history is provided by the patient and medical records.    Past Medical History:  Diagnosis Date   Anxiety    Depression    Diabetes mellitus without complication ([place])    Hypertension     Patient Active Problem List   Diagnosis Date Noted   Genetic screening 12/24/2021   Family history of breast cancer in mother 11/07/2021   Wound of right leg 10/29/2021   Left hand pain 02/06/2021   Diarrhea 01/21/2021   Gastritis 12/06/2020   Chronic headaches 12/06/2020   Allergic rhinitis 10/17/2020   Right knee pain 09/10/2020   Fall 09/10/2020   History of COVID-19 02/23/2020   Dysmenorrhea 12/21/2019   Depression, major, single episode, mild ([place]) 10/20/2018   Carpal tunnel syndrome 04/14/2017   Noncompliance 11/26/2016   Morbid obesity with BMI of 40.0-44.9, adult ([place]) 08/25/2016   Rash 06/05/2015   Benign essential HTN 05/20/2015   HLD (hyperlipidemia) 05/31/2013   Type 2 diabetes mellitus without complication, with long-term current use of insulin ([place]) 05/31/2013   Nicotine dependence, cigarettes, uncomplicated 13/12/6576    Past Surgical History:  Procedure Laterality Date   CHOLECYSTECTOMY  2003   TUBAL LIGATION      OB History     Gravida  2    Para  2   Term      Preterm      AB      Living         SAB      IAB      Ectopic      Multiple      Live Births               Home Medications    Prior to Admission medications   Medication Sig Start Date End Date Taking? Authorizing Provider  amLODipine (NORVASC) 10 MG tablet Take 1 tablet by mouth once daily 12/22/21  Yes Dutch Quint B, FNP  cephALEXin (KEFLEX) 500 MG capsule Take 1 capsule (500 mg total) by mouth 2 (two) times daily for 5 days. 01/18/22 01/23/22 Yes Sharion Balloon, NP  ibuprofen (ADVIL) 800 MG tablet Take 1 tablet (800 mg total) by mouth every 8 (eight) hours as needed. 11/07/21  Yes Rubie Maid, MD  lisinopril (ZESTRIL) 40 MG tablet Take 1 tablet (40 mg total) by mouth daily. 01/21/21  Yes Leone Haven, MD  rosuvastatin (CRESTOR) 40 MG tablet Take 1 tablet by mouth once daily 12/22/21  Yes Webb, Abby Potash B, FNP  Semaglutide, 2 MG/DOSE, 8 MG/3ML SOPN Inject 2 mg as directed once a week. 10/30/21  Yes Leone Haven, MD  SEMGLEE, YFGN, 100 UNIT/ML Pen SMARTSIG:40 Unit(s) SUB-Q Every Night 06/13/21  Yes [provider]  sertraline (ZOLOFT)  50 MG tablet Take 1 tablet (50 mg total) by mouth daily. 12/05/21  Yes Leone Haven, MD  blood glucose meter kit and supplies KIT Dispense based on patient and insurance preference. Use 3 times daily as directed. (FOR ICD-10 E11.9). 10/17/20   Leone Haven, MD  glucose blood test strip Use as instructed 08/25/16   Coral Spikes, DO  Vitamin D, Ergocalciferol, (DRISDOL) 1.25 MG (50000 UT) CAPS capsule Take 1 capsule (50,000 Units total) by mouth 2 (two) times a week. 07/26/18   [name], CNM    Family History Family History  Problem Relation Age of Onset   Cancer Mother 79       breast cancer   Diabetes Mother    Multiple sclerosis Father 91       Progressive - not diagnosed until age 77   Diabetes Paternal Aunt    Diabetes Maternal Grandfather    Diabetes Paternal Aunt      Social History Social History   Tobacco Use   Smoking status: Every Day    Packs/day: 1.00    Years: 10.00    Total pack years: 10.00    Types: Cigarettes    Start date: 05/14/2006   Smokeless tobacco: Never  Vaping Use   Vaping Use: Never used  Substance Use Topics   Alcohol use: Yes    Alcohol/week: 0.0 standard drinks of alcohol    Comment: Rare    Drug use: No     Allergies   Metformin and related and Jardiance [empagliflozin]   Review of Systems Review of Systems  Constitutional:  Negative for chills and fever.  Gastrointestinal:  Positive for abdominal pain. Negative for constipation, diarrhea, nausea and vomiting.  Genitourinary:  Positive for dysuria, frequency and urgency. Negative for flank pain, hematuria, pelvic pain and vaginal discharge.  Skin:  Negative for color change and rash.  All other systems reviewed and are negative.    Physical Exam Triage Vital Signs ED Triage Vitals  Enc Vitals Group     BP      Pulse      Resp      Temp      Temp src      SpO2      Weight      Height      Head Circumference      Peak Flow      Pain Score      Pain Loc      Pain Edu?      Excl. in [place]?    No data found.  Updated Vital Signs BP (!) 146/86   Pulse 94   Temp 98.2 F (36.8 C)   Resp 18   LMP 01/08/2022   SpO2 99%   Visual Acuity Right Eye Distance:   Left Eye Distance:   Bilateral Distance:    Right Eye Near:   Left Eye Near:    Bilateral Near:     Physical Exam Vitals and nursing note reviewed.  Constitutional:      General: She is not in acute distress.    Appearance: She is well-developed. She is obese. She is not ill-appearing.  HENT:     Mouth/Throat:     Mouth: Mucous membranes are moist.  Cardiovascular:     Rate and Rhythm: Normal rate and regular rhythm.     Heart sounds: Normal heart sounds.  Pulmonary:     Effort: Pulmonary effort is normal. No respiratory distress.  Breath sounds: Normal breath sounds.   Abdominal:     General: Bowel sounds are normal.     Palpations: Abdomen is soft.     Tenderness: There is no abdominal tenderness. There is no right CVA tenderness, left CVA tenderness, guarding or rebound.  Musculoskeletal:     Cervical back: Neck supple.  Skin:    General: Skin is warm and dry.  Neurological:     General: No focal deficit present.     Mental Status: She is alert and oriented to person, place, and time.     Gait: Gait normal.  Psychiatric:        Mood and Affect: Mood normal.        Behavior: Behavior normal.      UC Treatments / Results  Labs (all labs ordered are listed, but only abnormal results are displayed) Labs Reviewed  POCT URINALYSIS DIP (MANUAL ENTRY) - Abnormal; Notable for the following components:      Result Value   Color, UA orange (*)    Glucose, UA >=1,000 (*)    Spec Grav, UA <=1.005 (*)    Blood, UA moderate (*)    Protein Ur, POC =30 (*)    Nitrite, UA Positive (*)    Leukocytes, UA Trace (*)    All other components within normal limits  POCT FASTING CBG [facility] MANUAL ENTRY - Abnormal; Notable for the following components:   POCT Glucose ([facility]) 282 (*)    All other components within normal limits  URINE CULTURE  POCT URINE PREGNANCY    EKG   Radiology No results found.  Procedures Procedures (including critical care time)  Medications Ordered in UC Medications - No data to display  Initial Impression / Assessment and Plan / UC Course  I have reviewed the triage vital signs and the nursing notes.  Pertinent labs & imaging results that were available during my care of the patient were reviewed by me and considered in my medical decision making (see chart for details).   Dysuria, UTI, type 2 diabetes with hyperglycemia, left lower quadrant abdominal pain.  CBG 282; patient ate earlier.  She is well-appearing.  Her abdomen is soft and nontender with good bowel sounds.  No CVA tenderness.  Afebrile and vital signs are stable.   Patient has not had her Ozempic in 2 weeks because the pharmacy has been out of it.  Instructed her to check her blood sugars before meals and at bedtime this weekend and to follow-up with her PCP on Monday.  Strict ED precautions discussed with patient at length.  She declines transfer to the ED at this time.  Treating her UTI with cephalexin.  Urine culture pending.  Patient agrees to plan of care.   Final Clinical Impressions(s) / UC Diagnoses   Final diagnoses:  Dysuria  Urinary tract infection without hematuria, site unspecified  Type 2 diabetes mellitus with hyperglycemia, with long-term current use of insulin (HCC)  Left lower quadrant abdominal pain     Discharge Instructions      Take the antibiotic as directed.  The urine culture is pending.  We will call you if it shows the need to change or discontinue your antibiotic.    Monitor for your blood sugar before meals and at bedtime this weekend.    Go to the emergency department if your high blood sugar levels continue or if your abdominal pain continues or you have other concerning symptoms.    Follow-up with your primary care  provider on Monday.     ED Prescriptions     Medication Sig Dispense Auth. Provider   cephALEXin (KEFLEX) 500 MG capsule Take 1 capsule (500 mg total) by mouth 2 (two) times daily for 5 days. 10 capsule Sharion Balloon, NP      PDMP not reviewed this encounter.   Sharion Balloon, NP 01/18/22 1536

## 2022-01-20 ENCOUNTER — Telehealth: Payer: Self-pay | Admitting: Family Medicine

## 2022-01-20 NOTE — Telephone Encounter (Signed)
Pt would like to be called regarding her ozempic

## 2022-01-21 LAB — URINE CULTURE: Culture: >=100000 — AB

## 2022-01-21 NOTE — Telephone Encounter (Signed)
I called and spoke with the patient and her pharmacy does not have the Ozempic 2 mg it is on backorder and I informed her to call other pharmacies to see if they have her dosage and if so call us back and give [place] the name of the pharmacy and I will send it to them and she understood.  Allison [name],cma

## 2022-01-29 ENCOUNTER — Ambulatory Visit: Payer: BC Managed Care – PPO | Admitting: Family Medicine

## 2022-02-11 ENCOUNTER — Encounter: Payer: Self-pay | Admitting: Family Medicine

## 2022-02-11 ENCOUNTER — Telehealth (INDEPENDENT_AMBULATORY_CARE_PROVIDER_SITE_OTHER): Payer: BC Managed Care – PPO | Admitting: Family Medicine

## 2022-02-11 DIAGNOSIS — J989 Respiratory disorder, unspecified: Secondary | ICD-10-CM | POA: Diagnosis not present

## 2022-02-11 MED ORDER — ALBUTEROL SULFATE HFA 108 (90 BASE) MCG/ACT IN AERS: 2.0000 | INHALATION_SPRAY | Freq: Four times a day (QID) | RESPIRATORY_TRACT | 0 refills | Status: DC | PRN

## 2022-02-11 MED ORDER — PREDNISONE 20 MG PO TABS: 40.0000 mg | ORAL_TABLET | Freq: Every day | ORAL | 0 refills | Status: DC

## 2022-02-11 NOTE — Progress Notes (Signed)
Virtual Visit via video Note  This visit type was conducted due to national recommendations for restrictions regarding the COVID-19 pandemic (e.g. social distancing).  This format is felt to be most appropriate for this patient at this time.  All issues noted in this document were discussed and addressed.  No physical exam was performed (except for noted visual exam findings with Video Visits).   I connected with [place] today at  9:00 AM EDT by a video enabled telemedicine application and verified that I am speaking with the correct person using two identifiers. Location patient: home Location provider: work  Persons participating in the virtual visit: patient, provider  I discussed the limitations, risks, security and privacy concerns of performing an evaluation and management service by telephone and the availability of in person appointments. I also discussed with the patient that there may be a patient responsible charge related to this service. The patient expressed understanding and agreed to proceed.  Reason for visit: Same-day visit  HPI: Respiratory illness: Patient notes she got sick on 9/29 with cough productive of green mucus.  She had nasal congestion as well.  She noted postnasal drip and some sore throat though the sore throat has improved.  She has rhinorrhea and body aches.  She notes has been wheezing at night.  She notes a loss of taste and smell.  She took a home COVID test on 10/1 that was negative and she repeated a home COVID test today that was also negative.  She traveled to [place] prior to getting sick.  She notes her sugars have been in the 80s-150s since getting back on her insulin and Ozempic.  She notes no shortness of breath or fevers.  She has been using NyQuil for her cough.  She notes that has been fairly beneficial.   ROS: See pertinent positives and negatives per HPI.  Past Medical History:  Diagnosis Date   Anxiety    Depression     Diabetes mellitus without complication (HCC)    Hypertension     Past Surgical History:  Procedure Laterality Date   CHOLECYSTECTOMY  2003   TUBAL LIGATION      Family History  Problem Relation Age of Onset   Cancer Mother 17       breast cancer   Diabetes Mother    Multiple sclerosis Father 36       Progressive - not diagnosed until age 68   Diabetes Paternal Aunt    Diabetes Maternal Grandfather    Diabetes Paternal Aunt     SOCIAL HX: Smoker   Current Outpatient Medications:    albuterol (VENTOLIN HFA) 108 (90 Base) MCG/ACT inhaler, Inhale 2 puffs into the lungs every 6 (six) hours as needed for wheezing or shortness of breath., Disp: 8 g, Rfl: 0   amLODipine (NORVASC) 10 MG tablet, Take 1 tablet by mouth once daily, Disp: 90 tablet, Rfl: 0   blood glucose meter kit and supplies KIT, Dispense based on patient and insurance preference. Use 3 times daily as directed. (FOR ICD-10 E11.9)., Disp: 1 each, Rfl: 0   glucose blood test strip, Use as instructed, Disp: 100 each, Rfl: 11   ibuprofen (ADVIL) 800 MG tablet, Take 1 tablet (800 mg total) by mouth every 8 (eight) hours as needed., Disp: 60 tablet, Rfl: 1   lisinopril (ZESTRIL) 40 MG tablet, Take 1 tablet (40 mg total) by mouth daily., Disp: 90 tablet, Rfl: 1   predniSONE (DELTASONE) 20 MG tablet, Take  2 tablets (40 mg total) by mouth daily with breakfast., Disp: 10 tablet, Rfl: 0   rosuvastatin (CRESTOR) 40 MG tablet, Take 1 tablet by mouth once daily, Disp: 90 tablet, Rfl: 0   Semaglutide, 2 MG/DOSE, 8 MG/3ML SOPN, Inject 2 mg as directed once a week., Disp: 3 mL, Rfl: 3   SEMGLEE, YFGN, 100 UNIT/ML Pen, SMARTSIG:40 Unit(s) SUB-Q Every Night, Disp: , Rfl:    sertraline (ZOLOFT) 50 MG tablet, Take 1 tablet (50 mg total) by mouth daily., Disp: 90 tablet, Rfl: 1   Vitamin D, Ergocalciferol, (DRISDOL) 1.25 MG (50000 UT) CAPS capsule, Take 1 capsule (50,000 Units total) by mouth 2 (two) times a week., Disp: 30 capsule, Rfl:  2  EXAM:  VITALS per patient if applicable:  GENERAL: alert, oriented, appears well and in no acute distress  HEENT: atraumatic, conjunttiva clear, no obvious abnormalities on inspection of external nose and ears  NECK: normal movements of the head and neck  LUNGS: on inspection no signs of respiratory distress, breathing rate appears normal, no obvious gross SOB, gasping or wheezing  CV: no obvious cyanosis  MS: moves all visible extremities without noticeable abnormality  PSYCH/NEURO: pleasant and cooperative, no obvious depression or anxiety, speech and thought processing grossly intact  ASSESSMENT AND PLAN:  Discussed the following assessment and plan:  Problem List Items Addressed This Visit     Respiratory illness    Patient has a likely viral respiratory illness.  COVID is less likely given her to negative home COVID test that were spaced out appropriately.  Discussed treatment with prednisone given her wheezing.  We will also send in an albuterol inhaler for her to use every 6 hours for the next 2 days and then as needed every 6 hours.  Discussed an antibiotic would not be indicated at this time as the duration of her symptoms and her specific symptoms are more indicative of a viral illness.  I will write her out of work through tomorrow.  If her symptoms are improving and she is feeling up to it she can return to work on 02/13/2022.  If she continues to have symptoms and are not improving she will let me know and we can give her a work note for the rest of the week.  Discussed risk of sleep issues, agitation, and increased appetite as well as elevated sugars with the prednisone.  If her sugars get up into the upper 300s or 400s she will let me know.  Discussed the risk of jitteriness with the albuterol.      Relevant Medications   predniSONE (DELTASONE) 20 MG tablet   albuterol (VENTOLIN HFA) 108 (90 Base) MCG/ACT inhaler    No follow-ups on file.   I discussed the  assessment and treatment plan with the patient. The patient was provided an opportunity to ask questions and all were answered. The patient agreed with the plan and demonstrated an understanding of the instructions.   The patient was advised to call back or seek an in-person evaluation if the symptoms worsen or if the condition fails to improve as anticipated.   Tommi Rumps, MD

## 2022-02-11 NOTE — Assessment & Plan Note (Signed)
Patient has a likely viral respiratory illness.  COVID is less likely given her to negative home COVID test that were spaced out appropriately.  Discussed treatment with prednisone given her wheezing.  We will also send in an albuterol inhaler for her to use every 6 hours for the next 2 days and then as needed every 6 hours.  Discussed an antibiotic would not be indicated at this time as the duration of her symptoms and her specific symptoms are more indicative of a viral illness.  I will write her out of work through tomorrow.  If her symptoms are improving and she is feeling up to it she can return to work on 02/13/2022.  If she continues to have symptoms and are not improving she will let me know and we can give her a work note for the rest of the week.  Discussed risk of sleep issues, agitation, and increased appetite as well as elevated sugars with the prednisone.  If her sugars get up into the upper 300s or 400s she will let me know.  Discussed the risk of jitteriness with the albuterol.

## 2022-02-13 ENCOUNTER — Telehealth: Payer: Self-pay

## 2022-02-13 NOTE — Telephone Encounter (Signed)
Patient states she recently had a MyChart visit with Dr. Tommi Rumps and he asked her to please call the office on Thursday if she wasn't better and needed a note to stay out of work longer.  Patient states she needs a note to stay out of work until Monday (02/17/2022).  Patient states she doesn't think what she is taking is working.  Patient states she just doesn't feel better.  Patient states she still has coughing, congestion, and fatigue.

## 2022-02-14 MED ORDER — AMOXICILLIN-POT CLAVULANATE 875-125 MG PO TABS: 1.0000 | ORAL_TABLET | Freq: Two times a day (BID) | ORAL | 0 refills | Status: DC

## 2022-02-14 NOTE — Telephone Encounter (Signed)
I called and LVM informing the patient that her medication was sent to the pharmacy and if she gets any worse she would need to be evaluated again. Jordy Hewins,cma

## 2022-02-14 NOTE — Telephone Encounter (Signed)
Letter sent to patient's MyChart.  Given her persistent symptoms I went ahead and sent an antibiotic in.  If this is not helping she needs to be evaluated again in person.

## 2022-03-08 ENCOUNTER — Other Ambulatory Visit: Payer: Self-pay | Admitting: Family Medicine

## 2022-03-08 DIAGNOSIS — I10 Essential (primary) hypertension: Secondary | ICD-10-CM

## 2022-03-15 ENCOUNTER — Other Ambulatory Visit: Payer: Self-pay | Admitting: Family

## 2022-03-15 DIAGNOSIS — I10 Essential (primary) hypertension: Secondary | ICD-10-CM

## 2022-05-26 ENCOUNTER — Other Ambulatory Visit: Payer: Self-pay | Admitting: Family Medicine

## 2022-06-03 ENCOUNTER — Encounter: Payer: Self-pay | Admitting: Family Medicine

## 2022-06-03 ENCOUNTER — Telehealth: Payer: Self-pay | Admitting: Family Medicine

## 2022-06-03 ENCOUNTER — Ambulatory Visit: Payer: BC Managed Care – PPO | Admitting: Family Medicine

## 2022-06-03 VITALS — BP 140/85 | HR 81 | Temp 97.4°F | Ht 61.0 in | Wt 184.0 lb

## 2022-06-03 DIAGNOSIS — F320 Major depressive disorder, single episode, mild: Secondary | ICD-10-CM

## 2022-06-03 DIAGNOSIS — Z794 Long term (current) use of insulin: Secondary | ICD-10-CM

## 2022-06-03 DIAGNOSIS — E119 Type 2 diabetes mellitus without complications: Secondary | ICD-10-CM | POA: Diagnosis not present

## 2022-06-03 DIAGNOSIS — I10 Essential (primary) hypertension: Secondary | ICD-10-CM

## 2022-06-03 DIAGNOSIS — K219 Gastro-esophageal reflux disease without esophagitis: Secondary | ICD-10-CM

## 2022-06-03 DIAGNOSIS — R898 Other abnormal findings in specimens from other organs, systems and tissues: Secondary | ICD-10-CM

## 2022-06-03 DIAGNOSIS — E785 Hyperlipidemia, unspecified: Secondary | ICD-10-CM | POA: Diagnosis not present

## 2022-06-03 DIAGNOSIS — R09A2 Foreign body sensation, throat: Secondary | ICD-10-CM | POA: Insufficient documentation

## 2022-06-03 DIAGNOSIS — Z1379 Encounter for other screening for genetic and chromosomal anomalies: Secondary | ICD-10-CM

## 2022-06-03 LAB — BASIC METABOLIC PANEL
BUN: 14 mg/dL (ref 6–23)
CO2: 25 mEq/L (ref 19–32)
Calcium: 9.3 mg/dL (ref 8.4–10.5)
Chloride: 107 mEq/L (ref 96–112)
Creatinine, Ser: 0.72 mg/dL (ref 0.40–1.20)
GFR: 107.18 mL/min (ref >60.00)
Glucose, Bld: 95 mg/dL (ref 70–99)
Potassium: 3.9 mEq/L (ref 3.5–5.1)
Sodium: 140 mEq/L (ref 135–145)

## 2022-06-03 LAB — HEMOGLOBIN A1C: Hgb A1c MFr Bld: 6.3 % (ref 4.6–6.5)

## 2022-06-03 MED ORDER — SERTRALINE HCL 100 MG PO TABS: 100.0000 mg | ORAL_TABLET | Freq: Every day | ORAL | 3 refills | Status: DC

## 2022-06-03 MED ORDER — AMLODIPINE BESYLATE 10 MG PO TABS: 10.0000 mg | ORAL_TABLET | Freq: Every day | ORAL | 0 refills | Status: DC

## 2022-06-03 MED ORDER — ONDANSETRON HCL 4 MG PO TABS: 4.0000 mg | ORAL_TABLET | Freq: Three times a day (TID) | ORAL | 0 refills | Status: DC | PRN

## 2022-06-03 MED ORDER — LISINOPRIL 40 MG PO TABS: 40.0000 mg | ORAL_TABLET | Freq: Every day | ORAL | 0 refills | Status: DC

## 2022-06-03 MED ORDER — ROSUVASTATIN CALCIUM 40 MG PO TABS: 40.0000 mg | ORAL_TABLET | Freq: Every day | ORAL | 0 refills | Status: DC

## 2022-06-03 MED ORDER — ESOMEPRAZOLE MAGNESIUM 20 MG PO CPDR: 20.0000 mg | DELAYED_RELEASE_CAPSULE | Freq: Every day | ORAL | 0 refills | Status: DC

## 2022-06-03 NOTE — Progress Notes (Signed)
Tommi Rumps, MD Phone: [phone]  Allison [name] is a 37 y.o. female who presents today for f/u.  Anxiety and depression: Patient notes this has worsened.  She has had a lot going on recently.  Her father passed away and then she subsequently found out that he was not actually her father.  Her daughter is transitioning.  She was in the process of getting a divorce and subsequently started a relationship with someone new though she is unsure if she is now getting a divorce as she has started talking to her husband again.  She also found out she has Lynch syndrome.  She did call the therapist though did not want to talk to them about all of this at this time.  She has had thoughts that her kids would be better off without her though she has no plans or intent to harm herself and has not had any suicidal attempts.  Reflux/nausea: Patient notes this has been going on for a while now.  She feels as though she has a lump in her throat and a sour taste in her throat.  She has nausea.  At times she vomits.  She has had constipation with bowel movements 1-2 times per week.  Yesterday she had some diarrhea.  No blood in her stool.  No blood in her vomitus.  Diabetes: Patient notes her sugars have been generally well-controlled.  She notes occasionally she has had a low sugar.  She notes the pharmacist advised her there is a warning about Semglee and Ozempic causing nausea.  Social History   Tobacco Use  Smoking Status Every Day   Packs/day: 1.00   Years: 10.00   Total pack years: 10.00   Types: Cigarettes   Start date: 05/14/2006  Smokeless Tobacco Never    Current Outpatient Medications on File Prior to Visit  Medication Sig Dispense Refill   albuterol (VENTOLIN HFA) 108 (90 Base) MCG/ACT inhaler Inhale 2 puffs into the lungs every 6 (six) hours as needed for wheezing or shortness of breath. 8 g 0   blood glucose meter kit and supplies KIT Dispense based on patient and insurance  preference. Use 3 times daily as directed. (FOR ICD-10 E11.9). 1 each 0   glucose blood test strip Use as instructed 100 each 11   ibuprofen (ADVIL) 800 MG tablet Take 1 tablet (800 mg total) by mouth every 8 (eight) hours as needed. 60 tablet 1   SEMGLEE, YFGN, 100 UNIT/ML Pen INJECT 40 UNITS SUBCUTANEOUSLY AT BEDTIME 30 mL 0   No current facility-administered medications on file prior to visit.     ROS see history of present illness  Objective  Physical Exam Vitals:   06/03/22 0939 06/03/22 1001  BP: (!) 140/80 (!) 140/85  Pulse: 81   Temp: (!) 97.4 F (36.3 C)   SpO2: 99%     BP Readings from Last 3 Encounters:  06/03/22 (!) 140/85  01/18/22 (!) 146/86  12/24/21 (!) 140/84   Wt Readings from Last 3 Encounters:  06/03/22 184 lb (83.5 kg)  02/11/22 194 lb (88 kg)  12/24/21 197 lb 14.4 oz (89.8 kg)    Physical Exam Constitutional:      General: She is not in acute distress.    Appearance: She is not diaphoretic.  Cardiovascular:     Rate and Rhythm: Normal rate and regular rhythm.     Heart sounds: Normal heart sounds.  Pulmonary:     Effort: Pulmonary effort is normal.  Breath sounds: Normal breath sounds.  Abdominal:     General: Bowel sounds are normal. There is no distension.     Palpations: Abdomen is soft.     Tenderness: There is no abdominal tenderness.  Skin:    General: Skin is warm and dry.  Neurological:     Mental Status: She is alert.      Assessment/Plan: Please see individual problem list.  Hyperlipidemia, unspecified hyperlipidemia type -     Rosuvastatin Calcium; Take 1 tablet (40 mg total) by mouth daily.  Dispense: 90 tablet; Refill: 0  Benign essential HTN Assessment & Plan: Chronic issue.  BP is a little bit above goal today though she is quite stressed.  Will see what her blood pressure is at her next check and consider altering her regimen if needed at that time.  Orders: -     Lisinopril; Take 1 tablet (40 mg total) by  mouth daily.  Dispense: 90 tablet; Refill: 0 -     amLODIPine Besylate; Take 1 tablet (10 mg total) by mouth daily.  Dispense: 90 tablet; Refill: 0 -     Basic metabolic panel  Depression, major, single episode, mild (HCC) Assessment & Plan: Chronic issue.  Worsened recently with anxiety as well.  Will increase her Zoloft to 100 mg once daily.  Advised to seek medical attention if she develops suicidal thoughts.  I will see her back in 1 month.  She was counseled on the risk of sexual dysfunction, sleep issues, and weight gain with the dose increase of Zoloft.  Orders: -     Sertraline HCl; Take 1 tablet (100 mg total) by mouth daily.  Dispense: 90 tablet; Refill: 3  Type 2 diabetes mellitus without complication, with long-term current use of insulin ([place]) Assessment & Plan: Chronic issue.  Check A1c.  I am having her discontinue her Ozempic given her GI issues.  She will continue Semglee 40 units daily.  I will check with our clinical pharmacist regarding interaction between [facility] and [place].  Orders: -     Hemoglobin A1c  Gastroesophageal reflux disease, unspecified whether esophagitis present Assessment & Plan: Patient's symptoms seem consistent with reflux.  I suspect her Ozempic is playing a role.  She will discontinue her Ozempic.  Will start her on Nexium 20 mg daily 30 minutes before breakfast for 14 days.  If her symptoms recur or do not improve with these changes she will let us know.  She can use the Zofran as needed for nausea.  Orders: -     Ondansetron HCl; Take 1 tablet (4 mg total) by mouth every 8 (eight) hours as needed for nausea or vomiting.  Dispense: 20 tablet; Refill: 0 -     Esomeprazole Magnesium; Take 1 capsule (20 mg total) by mouth daily for 14 days. Take 30 minutes before breakfast.  Dispense: 14 capsule; Refill: 0  Genetic screening Assessment & Plan: Genetic screening revealed a variant of uncertain significance that is associated with Lynch syndrome.  I  will send a message to GI to get their input on whether or not she needs some kind of screening with this.      Return in about 1 month (around 07/04/2022) for anxiety/depression/nausea.   Tommi Rumps, MD [place]

## 2022-06-03 NOTE — Telephone Encounter (Signed)
LVM for patient to call back.   Tajha Sammarco,cma  

## 2022-06-03 NOTE — Assessment & Plan Note (Signed)
Patient's symptoms seem consistent with reflux.  I suspect her Ozempic is playing a role.  She will discontinue her Ozempic.  Will start her on Nexium 20 mg daily 30 minutes before breakfast for 14 days.  If her symptoms recur or do not improve with these changes she will let us know.  She can use the Zofran as needed for nausea.

## 2022-06-03 NOTE — Assessment & Plan Note (Signed)
Chronic issue.  BP is a little bit above goal today though she is quite stressed.  Will see what her blood pressure is at her next check and consider altering her regimen if needed at that time.

## 2022-06-03 NOTE — Assessment & Plan Note (Addendum)
Chronic issue.  Worsened recently with anxiety as well.  Will increase her Zoloft to 100 mg once daily.  Advised to seek medical attention if she develops suicidal thoughts.  I will see her back in 1 month.  She was counseled on the risk of sexual dysfunction, sleep issues, and weight gain with the dose increase of Zoloft.

## 2022-06-03 NOTE — Telephone Encounter (Signed)
-----  Message from Jonathon Bellows, MD sent at 06/03/2022 10:34 AM EST ----- Hi anything to with lynch I would recommend a colonoscopy and EGD    ----- Message ----- From: Leone Haven, MD Sent: 06/03/2022  10:32 AM EST To: Jonathon Bellows, MD  Hi Dr Vicente Males,   I wanted to see if you might be able to provide some guidance on this patient. 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 I know when someone has a confirmed diagnosis of lynch syndrome they should have GI cancer screening. Given that she only has a variant of undetermined significance would she qualify for screening? I can also loop genetics in on this to get their input if needed. Thanks for your help.  Randall Hiss

## 2022-06-03 NOTE — Assessment & Plan Note (Signed)
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  I will send a message to GI to get their input on whether or not she needs some kind of screening with this.

## 2022-06-03 NOTE — Patient Instructions (Signed)
Nice to see you. We can increase her Zoloft to 100 mg daily.  If you have any of the side effects we discussed please let us know. We will put you on Nexium 20 mg once daily taken 30 minutes before breakfast.  You will stop your Ozempic.  You can use the Zofran as needed for your nausea.

## 2022-06-03 NOTE — Telephone Encounter (Signed)
Please let the patient know I heard back from GI.  They noted with anything having to do with Lynch syndrome they would recommend a colonoscopy and EGD.  I would like to refer her to GI to discuss this.  I can place this referral after you inform her of this.  Thanks.

## 2022-06-03 NOTE — Assessment & Plan Note (Addendum)
Chronic issue.  Check A1c.  I am having her discontinue her Ozempic given her GI issues.  She will continue Semglee 40 units daily.  I will check with our clinical pharmacist regarding interaction between [facility] and [place].

## 2022-06-04 NOTE — Telephone Encounter (Signed)
Patient returned my call and I informed her that the provider stated that the pharmacist recommended a colonoscopy and a EGD and the patient stated she is okay to do both of those.  Kevonna Nolte,cma

## 2022-06-04 NOTE — Telephone Encounter (Signed)
Referral placed.

## 2022-06-04 NOTE — Addendum Note (Signed)
Addended by: [name] Bonni Neuser G on: 06/04/2022 11:56 AM   Modules accepted: Orders

## 2022-06-04 NOTE — Telephone Encounter (Signed)
LVM for patient to call back.   Allison [name],cma  

## 2022-06-11 ENCOUNTER — Ambulatory Visit: Payer: BC Managed Care – PPO | Admitting: Family Medicine

## 2022-06-16 NOTE — Telephone Encounter (Signed)
Pt called stating the esomeprazole is not working. Pt would like to be called

## 2022-06-19 NOTE — Telephone Encounter (Signed)
Lm for pt to cb  

## 2022-06-19 NOTE — Telephone Encounter (Signed)
Please call the patient and see what symptoms she is currently having.  Thanks.

## 2022-06-19 NOTE — Telephone Encounter (Signed)
Pt called and I read the message to her and she stated its burning in her throat, nausea, the lump and sourness still

## 2022-06-20 MED ORDER — PANTOPRAZOLE SODIUM 40 MG PO TBEC: 40.0000 mg | DELAYED_RELEASE_TABLET | Freq: Every day | ORAL | 0 refills | Status: DC

## 2022-06-25 ENCOUNTER — Encounter: Payer: Self-pay | Admitting: Family Medicine

## 2022-06-25 ENCOUNTER — Ambulatory Visit (INDEPENDENT_AMBULATORY_CARE_PROVIDER_SITE_OTHER): Payer: BC Managed Care – PPO | Admitting: Family Medicine

## 2022-06-25 VITALS — BP 146/86 | HR 78 | Temp 98.0°F | Resp 16 | Ht 61.0 in | Wt 181.0 lb

## 2022-06-25 DIAGNOSIS — F320 Major depressive disorder, single episode, mild: Secondary | ICD-10-CM

## 2022-06-25 DIAGNOSIS — I10 Essential (primary) hypertension: Secondary | ICD-10-CM | POA: Diagnosis not present

## 2022-06-25 DIAGNOSIS — K219 Gastro-esophageal reflux disease without esophagitis: Secondary | ICD-10-CM | POA: Diagnosis not present

## 2022-06-25 DIAGNOSIS — E119 Type 2 diabetes mellitus without complications: Secondary | ICD-10-CM

## 2022-06-25 DIAGNOSIS — Z794 Long term (current) use of insulin: Secondary | ICD-10-CM

## 2022-06-25 MED ORDER — INSULIN GLARGINE-YFGN 100 UNIT/ML ~~LOC~~ SOPN: 15.0000 [IU] | PEN_INJECTOR | Freq: Two times a day (BID) | SUBCUTANEOUS | 3 refills | Status: DC

## 2022-06-25 NOTE — Assessment & Plan Note (Signed)
Chronic issue.  Seems to be very well-controlled.  Discussed reducing her Semglee to 15 units twice daily and seeing if that is beneficial.  If her low sugars do not resolve with this she will let us know.  If her sugars shoot up she will let us know as well.

## 2022-06-25 NOTE — Assessment & Plan Note (Signed)
Chronic issue.  Improved.  She will continue Zoloft 100 mg daily.

## 2022-06-25 NOTE — Assessment & Plan Note (Signed)
Patient has not taken her blood pressure medication today.  I encouraged her to take this consistently.  I will see her back in 3 months to recheck.

## 2022-06-25 NOTE — Assessment & Plan Note (Signed)
Patient symptoms continue to seem consistent with reflux.  She will start taking the Protonix 30 to 60 minutes before she eats her first meal after she wakes up from her longest period of sleep.  Discussed she should notice benefit from this within a week or so.  If there is no benefit she will let us know we can refer to ENT to evaluate the lump in her throat.

## 2022-06-25 NOTE — Patient Instructions (Signed)
Nice to see you. We will have you reduce your Semglee to 15 units twice daily.  If your low sugars do not improve with this dose reduction please let us know. If the lump in your throat and sour taste does not improve with the Protonix please let me know.

## 2022-06-25 NOTE — Progress Notes (Signed)
Tommi Rumps, MD Phone: [phone]  Allison [name] is a 37 y.o. female who presents today for f/u.  Depression: Patient notes this has improved.  She notes she is still doing the things that were causing her distress previously though she has decided to not be worried about it anymore.  This has been helpful to her mindset.  She has had no side effects with the increased dose of Zoloft.  No SI.  Reflux: Patient notes she continues to have a lump in her throat and a sour taste that is always there.  It is worse around 5 AM in the morning.  She burps with this.  She notes no burning sensation in her chest.  No abdominal pain or blood in her stool or trouble swallowing.  She just started the Protonix 2 days ago.  Diabetes: Patient notes her highest sugar has been 140.  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  She has been eating significantly healthier.  She had to drink juice and eat candy when her sugars get low.  She ended up splitting her insulin into 20 units twice daily.    Social History   Tobacco Use  Smoking Status Every Day   Packs/day: 1.00   Years: 10.00   Total pack years: 10.00   Types: Cigarettes   Start date: 05/14/2006  Smokeless Tobacco Never    Current Outpatient Medications on File Prior to Visit  Medication Sig Dispense Refill   albuterol (VENTOLIN HFA) 108 (90 Base) MCG/ACT inhaler Inhale 2 puffs into the lungs every 6 (six) hours as needed for wheezing or shortness of breath. 8 g 0   amLODipine (NORVASC) 10 MG tablet Take 1 tablet (10 mg total) by mouth daily. 90 tablet 0   blood glucose meter kit and supplies KIT Dispense based on patient and insurance preference. Use 3 times daily as directed. (FOR ICD-10 E11.9). 1 each 0   glucose blood test strip Use as instructed 100 each 11   ibuprofen (ADVIL) 800 MG tablet Take 1 tablet (800 mg total) by mouth every 8 (eight) hours as needed. 60 tablet 1    lisinopril (ZESTRIL) 40 MG tablet Take 1 tablet (40 mg total) by mouth daily. 90 tablet 0   ondansetron (ZOFRAN) 4 MG tablet Take 1 tablet (4 mg total) by mouth every 8 (eight) hours as needed for nausea or vomiting. 20 tablet 0   pantoprazole (PROTONIX) 40 MG tablet Take 1 tablet (40 mg total) by mouth daily. 30 tablet 0   rosuvastatin (CRESTOR) 40 MG tablet Take 1 tablet (40 mg total) by mouth daily. 90 tablet 0   sertraline (ZOLOFT) 100 MG tablet Take 1 tablet (100 mg total) by mouth daily. 90 tablet 3   No current facility-administered medications on file prior to visit.     ROS see history of present illness  Objective  Physical Exam Vitals:   06/25/22 0942 06/25/22 1005  BP: (!) 150/94 (!) 146/86  Pulse: 78   Resp: 16   Temp: 98 F (36.7 C)   SpO2: 98%     BP Readings from Last 3 Encounters:  06/25/22 (!) 146/86  06/03/22 (!) 140/85  01/18/22 (!) 146/86   Wt Readings from Last 3 Encounters:  06/25/22 181 lb (82.1 kg)  06/03/22 184 lb (83.5 kg)  02/11/22 194 lb (88 kg)    Physical Exam Constitutional:      General: She is  not in acute distress.    Appearance: She is not diaphoretic.  Cardiovascular:     Rate and Rhythm: Normal rate and regular rhythm.     Heart sounds: Normal heart sounds.  Pulmonary:     Effort: Pulmonary effort is normal.     Breath sounds: Normal breath sounds.  Skin:    General: Skin is warm and dry.  Neurological:     Mental Status: She is alert.      Assessment/Plan: Please see individual problem list.  Type 2 diabetes mellitus without complication, with long-term current use of insulin ([place]) Assessment & Plan: Chronic issue.  Seems to be very well-controlled.  Discussed reducing her Semglee to 15 units twice daily and seeing if that is beneficial.  If her low sugars do not resolve with this she will let us know.  If her sugars shoot up she will let us know as well.  Orders: -     Insulin Glargine-yfgn; Inject 15 Units into the  skin 2 (two) times daily.  Dispense: 30 mL; Refill: 3  Depression, major, single episode, mild (HCC) Assessment & Plan: Chronic issue.  Improved.  She will continue Zoloft 100 mg daily.   Gastroesophageal reflux disease, unspecified whether esophagitis present Assessment & Plan: Patient symptoms continue to seem consistent with reflux.  She will start taking the Protonix 30 to 60 minutes before she eats her first meal after she wakes up from her longest period of sleep.  Discussed she should notice benefit from this within a week or so.  If there is no benefit she will let us know we can refer to ENT to evaluate the lump in her throat.   Benign essential HTN Assessment & Plan: Patient has not taken her blood pressure medication today.  I encouraged her to take this consistently.  I will see her back in 3 months to recheck.     Return in about 3 months (around 09/23/2022) for hypertension/diabetes.   Tommi Rumps, MD [place]

## 2022-07-04 ENCOUNTER — Ambulatory Visit: Payer: BC Managed Care – PPO | Admitting: Family Medicine

## 2022-07-15 ENCOUNTER — Other Ambulatory Visit: Payer: Self-pay | Admitting: Family Medicine

## 2022-07-16 NOTE — Telephone Encounter (Signed)
LVM for patient to call back.   Gae Bon.cma

## 2022-07-16 NOTE — Telephone Encounter (Signed)
Please call the patient and see if the Protonix was helpful for her symptoms.  Thanks.

## 2022-07-17 NOTE — Telephone Encounter (Signed)
Pt called back and I read the message to her and she stated she still have the sourness in throat but not as bad but its still there. Pt stated she is waiting on the specialist to call her back

## 2022-07-17 NOTE — Telephone Encounter (Signed)
Noted. It looks like GI called her on 06/26/22 and left a message. She should call them directly to set up an appointment. Refill sent to pharmacy.

## 2022-08-11 DIAGNOSIS — E119 Type 2 diabetes mellitus without complications: Secondary | ICD-10-CM | POA: Diagnosis not present

## 2022-08-13 ENCOUNTER — Ambulatory Visit: Payer: BC Managed Care – PPO | Admitting: Family Medicine

## 2022-08-15 ENCOUNTER — Encounter: Payer: Self-pay | Admitting: Family Medicine

## 2022-08-15 ENCOUNTER — Ambulatory Visit (INDEPENDENT_AMBULATORY_CARE_PROVIDER_SITE_OTHER): Payer: BC Managed Care – PPO | Admitting: Family Medicine

## 2022-08-15 VITALS — BP 118/78 | HR 94 | Temp 97.9°F | Ht 61.0 in | Wt 168.6 lb

## 2022-08-15 DIAGNOSIS — I10 Essential (primary) hypertension: Secondary | ICD-10-CM

## 2022-08-15 DIAGNOSIS — E119 Type 2 diabetes mellitus without complications: Secondary | ICD-10-CM

## 2022-08-15 DIAGNOSIS — E785 Hyperlipidemia, unspecified: Secondary | ICD-10-CM

## 2022-08-15 DIAGNOSIS — F419 Anxiety disorder, unspecified: Secondary | ICD-10-CM | POA: Diagnosis not present

## 2022-08-15 DIAGNOSIS — F32A Anxiety disorder, unspecified: Secondary | ICD-10-CM

## 2022-08-15 DIAGNOSIS — Z794 Long term (current) use of insulin: Secondary | ICD-10-CM

## 2022-08-15 MED ORDER — AMLODIPINE BESYLATE 10 MG PO TABS: 10.0000 mg | ORAL_TABLET | Freq: Every day | ORAL | 3 refills | Status: DC

## 2022-08-15 MED ORDER — INSULIN GLARGINE-YFGN 100 UNIT/ML ~~LOC~~ SOPN: 13.0000 [IU] | PEN_INJECTOR | Freq: Two times a day (BID) | SUBCUTANEOUS | 3 refills | Status: DC

## 2022-08-15 MED ORDER — HYDROXYZINE HCL 10 MG PO TABS: 10.0000 mg | ORAL_TABLET | Freq: Three times a day (TID) | ORAL | 0 refills | Status: DC | PRN

## 2022-08-15 MED ORDER — SERTRALINE HCL 100 MG PO TABS: 150.0000 mg | ORAL_TABLET | Freq: Every day | ORAL | 3 refills | Status: DC

## 2022-08-15 MED ORDER — ROSUVASTATIN CALCIUM 40 MG PO TABS: 40.0000 mg | ORAL_TABLET | Freq: Every day | ORAL | 3 refills | Status: DC

## 2022-08-15 MED ORDER — LISINOPRIL 40 MG PO TABS: 40.0000 mg | ORAL_TABLET | Freq: Every day | ORAL | 3 refills | Status: DC

## 2022-08-15 NOTE — Assessment & Plan Note (Signed)
Chronic issue.  A1c recently well-controlled.  She continues to have some intermittent lower sugars.  We will have her reduce her Semglee to 13 units twice daily and see if that is helpful.  If she continues to have sugars less than 80 she will let us know and we can reduce this further.

## 2022-08-15 NOTE — Assessment & Plan Note (Signed)
Chronic issue.  Significant worsening recently.  She has no plan or intent to harm herself.  She was counseled if she does develop plan or intent to harm or so she needs to call 911 or go straight to the emergency room.  Will refer to psychiatry.  Discussed if she is not able to get into see psychiatry in the near future she can go to [facility] for evaluation.  She will try to get a therapist through her work as they offer that at no cost.  We will increase her Zoloft to 150 mg daily.  If she has any worsening of her anxiety and depression she will let us know.  We will treat acute anxiety with hydroxyzine 10 mg 3 times daily as needed for anxiety.  Discussed risk of drowsiness with this medication and advised not to drive if drowsy.  I will see her back in 3 weeks.
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
## 2022-08-15 NOTE — Patient Instructions (Addendum)
Nice to see you. Please reduce your Semglee dose to 13 units twice daily. We are going to increase your Zoloft to 150 mg daily. Psychiatry should contact you sometime in the next week or so.  If you do not hear from them during that time please let us know. You can always go to [facility] for evaluation with a psychiatrist. [address], [place], [place] [address] You can try the hydroxyzine for acute anxiety.  If this makes you excessively drowsy please let us know.

## 2022-09-03 DIAGNOSIS — F331 Major depressive disorder, recurrent, moderate: Secondary | ICD-10-CM | POA: Diagnosis not present

## 2022-09-03 DIAGNOSIS — F411 Generalized anxiety disorder: Secondary | ICD-10-CM | POA: Diagnosis not present

## 2022-09-05 ENCOUNTER — Ambulatory Visit (INDEPENDENT_AMBULATORY_CARE_PROVIDER_SITE_OTHER): Payer: BC Managed Care – PPO | Admitting: Family Medicine

## 2022-09-05 ENCOUNTER — Encounter: Payer: Self-pay | Admitting: Family Medicine

## 2022-09-05 VITALS — BP 132/82 | HR 60 | Temp 98.2°F | Ht 61.0 in | Wt 174.0 lb

## 2022-09-05 DIAGNOSIS — Z794 Long term (current) use of insulin: Secondary | ICD-10-CM

## 2022-09-05 DIAGNOSIS — E119 Type 2 diabetes mellitus without complications: Secondary | ICD-10-CM | POA: Diagnosis not present

## 2022-09-05 DIAGNOSIS — R300 Dysuria: Secondary | ICD-10-CM | POA: Diagnosis not present

## 2022-09-05 DIAGNOSIS — F419 Anxiety disorder, unspecified: Secondary | ICD-10-CM | POA: Diagnosis not present

## 2022-09-05 DIAGNOSIS — F32A Depression, unspecified: Secondary | ICD-10-CM

## 2022-09-05 LAB — MICROALBUMIN / CREATININE URINE RATIO
Creatinine,U: 41.7 mg/dL
Microalb Creat Ratio: 6.5 mg/g (ref 0.0–30.0)
Microalb, Ur: 2.7 mg/dL — ABNORMAL HIGH (ref 0.0–1.9)

## 2022-09-05 LAB — POCT URINALYSIS DIPSTICK
Bilirubin, UA: NEGATIVE
Glucose, UA: NEGATIVE
Ketones, UA: NEGATIVE
Nitrite, UA: POSITIVE
Protein, UA: NEGATIVE
Spec Grav, UA: 1.02 (ref 1.010–1.025)
Urobilinogen, UA: 0.2 E.U./dL
pH, UA: 6 (ref 5.0–8.0)

## 2022-09-05 LAB — POCT GLYCOSYLATED HEMOGLOBIN (HGB A1C): Hemoglobin A1C: 5.2 % (ref 4.0–5.6)

## 2022-09-05 LAB — URINALYSIS, MICROSCOPIC ONLY

## 2022-09-05 MED ORDER — NITROFURANTOIN MONOHYD MACRO 100 MG PO CAPS: 100.0000 mg | ORAL_CAPSULE | Freq: Two times a day (BID) | ORAL | 0 refills | Status: DC

## 2022-09-05 NOTE — Patient Instructions (Signed)
Nice to see you. We will contact you with your urine results. Please start on the Macrobid for your likely UTI.

## 2022-09-05 NOTE — Progress Notes (Signed)
Marikay Alar, MD Phone: [phone]  Allison [name] is a 37 y.o. female who presents today for f/u.    Depression/anxiety: Patient notes these things are better.  Depression is quite a bit better.  The anxiety is relatively similar.  She notes some diaphoresis that has increased after increasing the Zoloft dose.  She notes hydroxyzine made her sleepy so she has not taken any more of it.  She notes her thoughts of being better off dead have improved as well.  She notes no plan or intent to harm herself.  She reports having a psychiatry appointment scheduled in May.     09/05/2022   10:04 AM 08/15/2022   10:21 AM 06/03/2022    9:43 AM 02/11/2022    9:01 AM 11/07/2021    9:10 AM  Depression screen PHQ 2/9  Decreased Interest 1 3 0 0 0  Down, Depressed, Hopeless 1 3 2  0 0  PHQ - 2 Score 2 6 2  0 0  Altered sleeping 1 3 1     Tired, decreased energy 1 1 1     Change in appetite 1 3 1     Feeling bad or failure about yourself  1 3 1     Trouble concentrating 1 3 2     Moving slowly or fidgety/restless 1 3 1     Suicidal thoughts 1  1    PHQ-9 Score 9 22 10     Difficult doing work/chores Extremely dIfficult Extremely dIfficult Somewhat difficult        09/05/2022   10:05 AM 08/15/2022   10:21 AM 06/03/2022   10:06 AM  GAD 7 : Generalized Anxiety Score  Nervous, Anxious, on Edge 3 3 2   Control/stop worrying 3 3 2   Worry too much - different things 3 3 2   Trouble relaxing 3 3 2   Restless 3 3 2   Easily annoyed or irritable 3 1 1   Afraid - awful might happen 3 3 1   Total GAD 7 Score 21 19 12   Anxiety Difficulty Extremely difficult Extremely difficult Somewhat difficult   UTI: Patient notes onset of symptoms 3 days ago.  She has burning and frequency as well as some urgency.  She had some blood in her urine.  She has feels a little better now that she has been taking AZO.  She has no vaginal discharge or vaginal itching.   Social History   Tobacco Use  Smoking Status Every Day    Packs/day: 1.00   Years: 10.00   Additional pack years: 0.00   Total pack years: 10.00   Types: Cigarettes   Start date: 05/14/2006  Smokeless Tobacco Never    Current Outpatient Medications on File Prior to Visit  Medication Sig Dispense Refill   albuterol (VENTOLIN HFA) 108 (90 Base) MCG/ACT inhaler Inhale 2 puffs into the lungs every 6 (six) hours as needed for wheezing or shortness of breath. 8 g 0   amLODipine (NORVASC) 10 MG tablet Take 1 tablet (10 mg total) by mouth daily. 90 tablet 3   blood glucose meter kit and supplies KIT Dispense based on patient and insurance preference. Use 3 times daily as directed. (FOR ICD-10 E11.9). 1 each 0   glucose blood test strip Use as instructed 100 each 11   hydrOXYzine (ATARAX) 10 MG tablet Take 1 tablet (10 mg total) by mouth 3 (three) times daily as needed. 30 tablet 0   ibuprofen (ADVIL) 800 MG tablet Take 1 tablet (800 mg total) by mouth every 8 (eight) hours as  needed. 60 tablet 1   insulin glargine-yfgn (SEMGLEE, YFGN,) 100 UNIT/ML Pen Inject 13 Units into the skin 2 (two) times daily. 30 mL 3   lisinopril (ZESTRIL) 40 MG tablet Take 1 tablet (40 mg total) by mouth daily. 90 tablet 3   ondansetron (ZOFRAN) 4 MG tablet Take 1 tablet (4 mg total) by mouth every 8 (eight) hours as needed for nausea or vomiting. 20 tablet 0   pantoprazole (PROTONIX) 40 MG tablet Take 1 tablet by mouth once daily 30 tablet 2   rosuvastatin (CRESTOR) 40 MG tablet Take 1 tablet (40 mg total) by mouth daily. 90 tablet 3   sertraline (ZOLOFT) 100 MG tablet Take 1.5 tablets (150 mg total) by mouth daily. 135 tablet 3   No current facility-administered medications on file prior to visit.     ROS see history of present illness  Objective  Physical Exam Vitals:   09/05/22 1003  BP: 132/82  Pulse: 60  Temp: 98.2 F (36.8 C)  SpO2: 99%    BP Readings from Last 3 Encounters:  09/05/22 132/82  08/15/22 118/78  06/25/22 (!) 146/86   Wt Readings from Last  3 Encounters:  09/05/22 174 lb (78.9 kg)  08/15/22 168 lb 9.6 oz (76.5 kg)  06/25/22 181 lb (82.1 kg)    Physical Exam Constitutional:      General: She is not in acute distress.    Appearance: She is not diaphoretic.  Cardiovascular:     Rate and Rhythm: Normal rate and regular rhythm.     Heart sounds: Normal heart sounds.  Pulmonary:     Effort: Pulmonary effort is normal.  Abdominal:     General: Bowel sounds are normal. There is no distension.     Palpations: Abdomen is soft.     Tenderness: There is no abdominal tenderness.  Skin:    General: Skin is warm and dry.  Neurological:     Mental Status: She is alert.    Diabetic Foot Exam - Simple   Simple Foot Form Diabetic Foot exam was performed with the following findings: Yes 09/05/2022 10:14 AM  Visual Inspection No deformities, no ulcerations, no other skin breakdown bilaterally: Yes Sensation Testing Intact to touch and monofilament testing bilaterally: Yes Pulse Check Posterior Tibialis and Dorsalis pulse intact bilaterally: Yes Comments      Assessment/Plan: Please see individual problem list.  Anxiety and depression Assessment & Plan: Chronic issue.  Has improved some over the last few weeks with increasing the dose of her Zoloft.  Discussed the option of continuing to monitor on her current dose of Zoloft or adding additional medication to help with this further.  She opts to continue with Zoloft 150 mg daily.  I suspect the diaphoresis is likely related to the Zoloft.  Discussed the option of switching Zoloft to something different or monitoring on the Zoloft.  She and I both opted to have her continue on the Zoloft given that it has been helping.  She will see psychiatry as planned.  Discussed limiting use of hydroxyzine to when she can be at home.   Dysuria Assessment & Plan: Concerning for UTI.  Will send urinalysis and culture.  Will treat with Macrobid 100 mg twice daily for 5 days.  Orders: -      Microalbumin / creatinine urine ratio -     POCT urinalysis dipstick -     Urine Culture -     Urine Microscopic -     Nitrofurantoin Monohyd  Macro; Take 1 capsule (100 mg total) by mouth 2 (two) times daily.  Dispense: 10 capsule; Refill: 0  Type 2 diabetes mellitus without complication, with long-term current use of insulin (HCC) -     POCT glycosylated hemoglobin (Hb A1C)     Return in about 3 months (around 12/05/2022).   Marikay Alar, MD [facility]

## 2022-09-05 NOTE — Assessment & Plan Note (Signed)
Concerning for UTI.  Will send urinalysis and culture.  Will treat with Macrobid 100 mg twice daily for 5 days.

## 2022-09-05 NOTE — Assessment & Plan Note (Signed)
Chronic issue.  Has improved some over the last few weeks with increasing the dose of her Zoloft.  Discussed the option of continuing to monitor on her current dose of Zoloft or adding additional medication to help with this further.  She opts to continue with Zoloft 150 mg daily.  I suspect the diaphoresis is likely related to the Zoloft.  Discussed the option of switching Zoloft to something different or monitoring on the Zoloft.  She and I both opted to have her continue on the Zoloft given that it has been helping.  She will see psychiatry as planned.  Discussed limiting use of hydroxyzine to when she can be at home.

## 2022-09-08 LAB — URINE CULTURE
MICRO NUMBER:: 14879778
SPECIMEN QUALITY:: ADEQUATE

## 2022-09-09 ENCOUNTER — Telehealth: Payer: Self-pay

## 2022-09-09 NOTE — Telephone Encounter (Signed)
Left voicemail to call the office back regarding Dr. Purvis Sheffield recommendations.

## 2022-09-09 NOTE — Telephone Encounter (Signed)
-----   Message from Eric G Sonnenberg, MD sent at 09/08/2022  2:22 PM EDT ----- She can reduce her insulin to 13 u once daily and continue to monitor her glucose closely. If she starts having higher sugars with this she should let us know. If she has any low sugars she needs to contact us as well.  

## 2022-09-10 ENCOUNTER — Telehealth: Payer: Self-pay

## 2022-09-10 DIAGNOSIS — F331 Major depressive disorder, recurrent, moderate: Secondary | ICD-10-CM | POA: Diagnosis not present

## 2022-09-10 DIAGNOSIS — F411 Generalized anxiety disorder: Secondary | ICD-10-CM | POA: Diagnosis not present

## 2022-09-10 DIAGNOSIS — E119 Type 2 diabetes mellitus without complications: Secondary | ICD-10-CM | POA: Diagnosis not present

## 2022-09-10 NOTE — Telephone Encounter (Signed)
-----   Message from Glori Luis, MD sent at 09/08/2022  2:22 PM EDT ----- She can reduce her insulin to 13 u once daily and continue to monitor her glucose closely. If she starts having higher sugars with this she should let us know. If she has any low sugars she needs to contact us as well.

## 2022-09-10 NOTE — Telephone Encounter (Signed)
Left message to call the office back regarding her insulin and glucose levels

## 2022-09-17 ENCOUNTER — Telehealth: Payer: Self-pay | Admitting: Family Medicine

## 2022-09-17 DIAGNOSIS — F331 Major depressive disorder, recurrent, moderate: Secondary | ICD-10-CM | POA: Diagnosis not present

## 2022-09-17 DIAGNOSIS — F32A Depression, unspecified: Secondary | ICD-10-CM

## 2022-09-17 DIAGNOSIS — F411 Generalized anxiety disorder: Secondary | ICD-10-CM | POA: Diagnosis not present

## 2022-09-17 NOTE — Telephone Encounter (Signed)
Left message for Patient to call the office back.

## 2022-09-17 NOTE — Telephone Encounter (Signed)
-----   Message from Elvina Mattes, [place] sent at 09/16/2022 10:40 AM EDT ----- Regarding: referral Currently our office is not accepting any new patient.  Please have the patient / patient guardian contact the insurance company and find a provider that is in their network.

## 2022-09-17 NOTE — Telephone Encounter (Signed)
[facility] office called stating the pt insurance is not in network with them

## 2022-09-17 NOTE — Telephone Encounter (Signed)
Referral placed to a different provider.

## 2022-09-22 ENCOUNTER — Encounter: Payer: Self-pay | Admitting: Physician Assistant

## 2022-09-22 ENCOUNTER — Ambulatory Visit (INDEPENDENT_AMBULATORY_CARE_PROVIDER_SITE_OTHER): Payer: BC Managed Care – PPO | Admitting: Physician Assistant

## 2022-09-22 VITALS — BP 165/89 | HR 77 | Ht 61.0 in | Wt 178.2 lb

## 2022-09-22 DIAGNOSIS — F4310 Post-traumatic stress disorder, unspecified: Secondary | ICD-10-CM | POA: Diagnosis not present

## 2022-09-22 DIAGNOSIS — F411 Generalized anxiety disorder: Secondary | ICD-10-CM | POA: Diagnosis not present

## 2022-09-22 DIAGNOSIS — F331 Major depressive disorder, recurrent, moderate: Secondary | ICD-10-CM | POA: Diagnosis not present

## 2022-09-22 DIAGNOSIS — F5105 Insomnia due to other mental disorder: Secondary | ICD-10-CM | POA: Diagnosis not present

## 2022-09-22 DIAGNOSIS — F99 Mental disorder, not otherwise specified: Secondary | ICD-10-CM

## 2022-09-22 MED ORDER — LORAZEPAM 0.5 MG PO TABS: 0.5000 mg | ORAL_TABLET | Freq: Four times a day (QID) | ORAL | 0 refills | Status: DC | PRN

## 2022-09-22 MED ORDER — QUETIAPINE FUMARATE 100 MG PO TABS: 100.0000 mg | ORAL_TABLET | Freq: Every day | ORAL | 1 refills | Status: DC
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
## 2022-09-24 ENCOUNTER — Ambulatory Visit: Payer: BC Managed Care – PPO | Admitting: Family Medicine

## 2022-10-01 NOTE — Telephone Encounter (Signed)
Spoke to Patient and she states she already saw someone at [facility] and they have started her on medication. She stated when [facility] called they stated she was referred by Dr. Birdie Sons.

## 2022-10-01 NOTE — Telephone Encounter (Signed)
Noted  

## 2022-10-11 DIAGNOSIS — E119 Type 2 diabetes mellitus without complications: Secondary | ICD-10-CM | POA: Diagnosis not present

## 2022-10-21 ENCOUNTER — Ambulatory Visit (INDEPENDENT_AMBULATORY_CARE_PROVIDER_SITE_OTHER): Payer: BC Managed Care – PPO | Admitting: Physician Assistant

## 2022-10-21 ENCOUNTER — Encounter: Payer: Self-pay | Admitting: Physician Assistant

## 2022-10-21 DIAGNOSIS — F5105 Insomnia due to other mental disorder: Secondary | ICD-10-CM

## 2022-10-21 DIAGNOSIS — F32A Depression, unspecified: Secondary | ICD-10-CM

## 2022-10-21 DIAGNOSIS — F331 Major depressive disorder, recurrent, moderate: Secondary | ICD-10-CM

## 2022-10-21 DIAGNOSIS — F411 Generalized anxiety disorder: Secondary | ICD-10-CM

## 2022-10-21 DIAGNOSIS — F99 Mental disorder, not otherwise specified: Secondary | ICD-10-CM

## 2022-10-21 DIAGNOSIS — E785 Hyperlipidemia, unspecified: Secondary | ICD-10-CM

## 2022-10-21 DIAGNOSIS — F4310 Post-traumatic stress disorder, unspecified: Secondary | ICD-10-CM

## 2022-10-21 MED ORDER — QUETIAPINE FUMARATE 100 MG PO TABS: 50.0000 mg | ORAL_TABLET | Freq: Every day | ORAL | 1 refills | Status: DC

## 2022-10-21 MED ORDER — LORAZEPAM 0.5 MG PO TABS: 0.5000 mg | ORAL_TABLET | Freq: Four times a day (QID) | ORAL | 0 refills | Status: DC | PRN

## 2022-10-21 MED ORDER — SERTRALINE HCL 100 MG PO TABS: 200.0000 mg | ORAL_TABLET | Freq: Every day | ORAL | 3 refills | Status: DC

## 2022-10-21 NOTE — Progress Notes (Signed)
[facility]  Patient ID: Allison [name],  MRN: [id number]  PCP: Glori Luis, MD  Date of Evaluation: 10/21/2022 Time spent:20 minutes  Chief Complaint:  Chief Complaint   Anxiety; Depression; Follow-up     HISTORY/CURRENT STATUS: HPI For 1 month med check.  First visit was a month ago, started Seroquel and Ativan, and she had just increased the Zoloft 2 weeks before that so the dose was left the same.   Still crying easily, but not as bad and it seems more triggered now. She's not eating well but not hungry (now off Ozempic, it made her sick) ADLs and personal hygiene are nl, still feels anxious but triggered by working with her Mom at work and she can't get away from her. Ativan is making the panicky times a little less often and severe.  Sleeps 10+ hours some times since we started Seroquel. If she doesn't take it, she only sleeps about 3 hours. No SI/HI.   Patient denies increased energy with decreased need for sleep, increased talkativeness, racing thoughts, impulsivity or risky behaviors, increased spending, increased libido, grandiosity, increased irritability or anger, paranoia, or hallucinations.  Denies dizziness, syncope, seizures, numbness, tingling, tremor, tics, unsteady gait, slurred speech, confusion. Denies muscle or joint pain, stiffness, or dystonia. Denies unexplained weight loss, frequent infections, or sores that heal slowly.  No polyphagia, polydipsia, or polyuria. Denies visual changes or paresthesias.   Individual Medical History/ Review of Systems: Changes? :No   Allergies: Metformin and related and Jardiance [empagliflozin]  Current Medications:  Current Outpatient Medications:    amLODipine (NORVASC) 10 MG tablet, Take 1 tablet (10 mg total) by mouth daily., Disp: 90 tablet, Rfl: 3   blood glucose meter kit and supplies KIT, Dispense based on patient and insurance preference. Use 3 times daily as directed. (FOR ICD-10 E11.9)., Disp:  1 each, Rfl: 0   glucose blood test strip, Use as instructed, Disp: 100 each, Rfl: 11   hydrOXYzine (ATARAX) 10 MG tablet, Take 1 tablet (10 mg total) by mouth 3 (three) times daily as needed., Disp: 30 tablet, Rfl: 0   ibuprofen (ADVIL) 800 MG tablet, Take 1 tablet (800 mg total) by mouth every 8 (eight) hours as needed., Disp: 60 tablet, Rfl: 1   insulin glargine-yfgn (SEMGLEE, YFGN,) 100 UNIT/ML Pen, Inject 13 Units into the skin 2 (two) times daily., Disp: 30 mL, Rfl: 3   lisinopril (ZESTRIL) 40 MG tablet, Take 1 tablet (40 mg total) by mouth daily., Disp: 90 tablet, Rfl: 3   ondansetron (ZOFRAN) 4 MG tablet, Take 1 tablet (4 mg total) by mouth every 8 (eight) hours as needed for nausea or vomiting., Disp: 20 tablet, Rfl: 0   pantoprazole (PROTONIX) 40 MG tablet, Take 1 tablet by mouth once daily, Disp: 30 tablet, Rfl: 2   rosuvastatin (CRESTOR) 40 MG tablet, Take 1 tablet (40 mg total) by mouth daily., Disp: 90 tablet, Rfl: 3   albuterol (VENTOLIN HFA) 108 (90 Base) MCG/ACT inhaler, Inhale 2 puffs into the lungs every 6 (six) hours as needed for wheezing or shortness of breath. (Patient not taking: Reported on 09/22/2022), Disp: 8 g, Rfl: 0   LORazepam (ATIVAN) 0.5 MG tablet, Take 1-2 tablets (0.5-1 mg total) by mouth every 6 (six) hours as needed for anxiety., Disp: 60 tablet, Rfl: 0   QUEtiapine (SEROQUEL) 100 MG tablet, Take 0.5 tablets (50 mg total) by mouth at bedtime., Disp: 30 tablet, Rfl: 1   sertraline (ZOLOFT) 100 MG tablet, Take 2  tablets (200 mg total) by mouth daily., Disp: 180 tablet, Rfl: 3 Medication Side Effects: none  Family Medical/ Social History: Changes? No  MENTAL HEALTH EXAM:  There were no vitals taken for this visit.There is no height or weight on file to calculate BMI.  General Appearance: Casual, Well Groomed, and multiple tattoos on arms bilat  Eye Contact:  Good  Speech:  Clear and Coherent and Normal Rate  Volume:  Normal  Mood:  Anxious and Depressed   Affect:  Anxious  Thought Process:  Goal Directed and Descriptions of Associations: Circumstantial  Orientation:  Full (Time, Place, and Person)  Thought Content: Logical   Suicidal Thoughts:  No  Homicidal Thoughts:  No  Memory:  WNL  Judgement:  Good  Insight:  Good  Psychomotor Activity:  Normal  Concentration:  Concentration: Good  Recall:  Good  Fund of Knowledge: Good  Language: Good  Assets:  Desire for Improvement Financial Resources/Insurance Housing Transportation Vocational/Educational  ADL's:  Intact  Cognition: WNL  Prognosis:  Good   DIAGNOSES:    ICD-10-CM   1. Moderate episode of recurrent major depressive disorder (HCC)  F33.1 sertraline (ZOLOFT) 100 MG tablet    2. PTSD (post-traumatic stress disorder)  F43.10     3. Insomnia due to other mental disorder  F51.05    F99     4. Generalized anxiety disorder  F41.1      Receiving Psychotherapy: Yes   RECOMMENDATIONS:  PDMP reviewed.  Ativan filled 09/23/2022. I provided 20 minutes of face to face time during this encounter, including time spent before and after the visit in records review, medical decision making, counseling pertinent to today's visit, and charting.   Her sx are a little better. The Seroquel is too sedating though so will decrease dose. Also increase Zoloft since she's been on current dose for about 6 weeks now and it's not as effective as it needs to be.   Cont Hydroxyzine 10 mg 1 tid prn. Increase Ativan 0.5 mg. 1-2 q6prn.  Decrease Seroquel to 50 mg  qhs. Increase Zoloft to 200 mg every day. Disc labs at next OV. Continue therapy. Return in 6 weeks.  Melony Overly, PA-C

## 2022-10-27 DIAGNOSIS — F4312 Post-traumatic stress disorder, chronic: Secondary | ICD-10-CM | POA: Diagnosis not present

## 2022-11-10 DIAGNOSIS — F4312 Post-traumatic stress disorder, chronic: Secondary | ICD-10-CM | POA: Diagnosis not present

## 2022-11-10 DIAGNOSIS — E119 Type 2 diabetes mellitus without complications: Secondary | ICD-10-CM | POA: Diagnosis not present

## 2022-12-04 ENCOUNTER — Ambulatory Visit: Payer: BC Managed Care – PPO | Admitting: Physician Assistant

## 2022-12-05 ENCOUNTER — Ambulatory Visit: Payer: BC Managed Care – PPO | Admitting: Family Medicine

## 2022-12-05 ENCOUNTER — Encounter: Payer: Self-pay | Admitting: Family Medicine

## 2022-12-05 VITALS — BP 126/80 | HR 73 | Temp 98.6°F | Ht 61.0 in | Wt 173.6 lb

## 2022-12-05 DIAGNOSIS — R450 Nervousness: Secondary | ICD-10-CM | POA: Insufficient documentation

## 2022-12-05 DIAGNOSIS — F419 Anxiety disorder, unspecified: Secondary | ICD-10-CM | POA: Diagnosis not present

## 2022-12-05 DIAGNOSIS — R09A2 Foreign body sensation, throat: Secondary | ICD-10-CM

## 2022-12-05 DIAGNOSIS — E119 Type 2 diabetes mellitus without complications: Secondary | ICD-10-CM | POA: Diagnosis not present

## 2022-12-05 DIAGNOSIS — F32A Depression, unspecified: Secondary | ICD-10-CM

## 2022-12-05 DIAGNOSIS — Z794 Long term (current) use of insulin: Secondary | ICD-10-CM | POA: Diagnosis not present

## 2022-12-05 DIAGNOSIS — I10 Essential (primary) hypertension: Secondary | ICD-10-CM

## 2022-12-05 LAB — TSH: TSH: 0.71 u[IU]/mL (ref 0.35–5.50)

## 2022-12-05 LAB — HEMOGLOBIN A1C: Hgb A1c MFr Bld: 6.1 % (ref 4.6–6.5)

## 2022-12-05 NOTE — Patient Instructions (Signed)
Nice to see you. Please start checking your blood pressure on a daily basis and send me readings in 2 weeks.

## 2022-12-05 NOTE — Assessment & Plan Note (Signed)
Chronic issue.  Check A1c.  Seems to be very well-controlled.  For now she will continue Semglee 13 units daily.

## 2022-12-05 NOTE — Progress Notes (Signed)
Marikay Alar, MD Phone: [phone]  Allison [name] is a 37 y.o. female who presents today for f/u.  HYPERTENSION Disease Monitoring Home BP Monitoring 120s-130s/80s-90s, though only checking once a week Chest pain- no    Dyspnea- no Medications Compliance-  taking amlodipine, lisinopril.  Edema- notes she had a little bit of swelling 2 weeks ago after eating pizza though none since BMET    Component Value Date/Time   NA 140 06/03/2022 1014   NA 136 10/12/2020 1500   NA 138 01/24/2012 1902   K 3.9 06/03/2022 1014   K 4.8 01/24/2012 1902   CL 107 06/03/2022 1014   CL 106 01/24/2012 1902   CO2 25 06/03/2022 1014   CO2 24 01/24/2012 1902   GLUCOSE 95 06/03/2022 1014   GLUCOSE 109 (H) 03/30/2012 1035   BUN 14 06/03/2022 1014   BUN 9 10/12/2020 1500   BUN 6 (L) 01/24/2012 1902   CREATININE 0.72 06/03/2022 1014   CREATININE 0.62 01/24/2012 1902   CALCIUM 9.3 06/03/2022 1014   CALCIUM 8.6 01/24/2012 1902   GFRNONAA 113 05/22/2020 0932   GFRNONAA >60 01/24/2012 1902   GFRAA 131 05/22/2020 0932   GFRAA >60 01/24/2012 1902   DIABETES Disease Monitoring: Blood Sugar ranges-typically 83-90 when she takes her insulin.  In the low 100s when she does not take her insulin.      Optho-up-to-date Medications: Compliance-takes Semglee 13 units daily though at times does not take it if her sugars are very well-controlled. Hypoglycemic symptoms-no  Anxiety/depression: Patient has been seeing psychiatry.  They started her on Seroquel.  They had to cut the dose in half given excessive sleepiness.  She is also on Zoloft and uses hydroxyzine and Ativan as needed.  She has had trouble finding an effective therapist.  She notes she has not been great at taking her medications this week.  No SI.  Nicotine abuse: Patient notes she quit smoking cigarettes though has started vaping daily.  She notes she has only used 1 vape pen since she quit smoking in April.  Jittery: Patient notes  episodes of jitteriness and sweating.  She notes her blood pressure seems to be okay when these occur.  Her sugars are not low when this occurs.  She wonders if it is anxiety related.  Social History   Tobacco Use  Smoking Status Former   Current packs/day: 1.00   Average packs/day: 1 pack/day for 16.6 years (16.6 ttl pk-yrs)   Types: E-cigarettes, Cigarettes   Start date: 05/14/2006  Smokeless Tobacco Never    Current Outpatient Medications on File Prior to Visit  Medication Sig Dispense Refill   amLODipine (NORVASC) 10 MG tablet Take 1 tablet (10 mg total) by mouth daily. 90 tablet 3   blood glucose meter kit and supplies KIT Dispense based on patient and insurance preference. Use 3 times daily as directed. (FOR ICD-10 E11.9). 1 each 0   glucose blood test strip Use as instructed 100 each 11   hydrOXYzine (ATARAX) 10 MG tablet Take 1 tablet (10 mg total) by mouth 3 (three) times daily as needed. 30 tablet 0   insulin glargine-yfgn (SEMGLEE, YFGN,) 100 UNIT/ML Pen Inject 13 Units into the skin 2 (two) times daily. 30 mL 3   lisinopril (ZESTRIL) 40 MG tablet Take 1 tablet (40 mg total) by mouth daily. 90 tablet 3   LORazepam (ATIVAN) 0.5 MG tablet Take 1-2 tablets (0.5-1 mg total) by mouth every 6 (six) hours as needed for anxiety. 60  tablet 0   QUEtiapine (SEROQUEL) 100 MG tablet Take 0.5 tablets (50 mg total) by mouth at bedtime. 30 tablet 1   rosuvastatin (CRESTOR) 40 MG tablet Take 1 tablet (40 mg total) by mouth daily. 90 tablet 3   sertraline (ZOLOFT) 100 MG tablet Take 2 tablets (200 mg total) by mouth daily. 180 tablet 3   albuterol (VENTOLIN HFA) 108 (90 Base) MCG/ACT inhaler Inhale 2 puffs into the lungs every 6 (six) hours as needed for wheezing or shortness of breath. (Patient not taking: Reported on 09/22/2022) 8 g 0   ibuprofen (ADVIL) 800 MG tablet Take 1 tablet (800 mg total) by mouth every 8 (eight) hours as needed. 60 tablet 1   ondansetron (ZOFRAN) 4 MG tablet Take 1 tablet  (4 mg total) by mouth every 8 (eight) hours as needed for nausea or vomiting. (Patient not taking: Reported on 12/05/2022) 20 tablet 0   No current facility-administered medications on file prior to visit.     ROS see history of present illness  Objective  Physical Exam Vitals:   12/05/22 0840  BP: 126/80  Pulse: 73  Temp: 98.6 F (37 C)  SpO2: 98%    BP Readings from Last 3 Encounters:  12/05/22 126/80  09/22/22 (!) 165/89  09/05/22 132/82   Wt Readings from Last 3 Encounters:  12/05/22 173 lb 9.6 oz (78.7 kg)  09/22/22 178 lb 3.2 oz (80.8 kg)  09/05/22 174 lb (78.9 kg)    Physical Exam Constitutional:      General: She is not in acute distress.    Appearance: She is not diaphoretic.  Cardiovascular:     Rate and Rhythm: Normal rate and regular rhythm.     Heart sounds: Normal heart sounds.  Pulmonary:     Effort: Pulmonary effort is normal.     Breath sounds: Normal breath sounds.  Musculoskeletal:     Right lower leg: No edema.     Left lower leg: No edema.  Skin:    General: Skin is warm and dry.  Neurological:     Mental Status: She is alert.      Assessment/Plan: Please see individual problem list.  Benign essential HTN Assessment & Plan: Chronic issue.  Above goal at home though she is only checking once weekly.  I have asked her to start checking on a daily basis for a couple of weeks and to send in her readings.  For now she will continue amlodipine 10 mg daily and lisinopril 40 mg daily.   Anxiety and depression Assessment & Plan: Chronic issue.  Reports improvement on her current regimen.  She will continue medications as prescribed by psychiatry Seroquel 50 mg daily, Zoloft 200 mg daily, and hydroxyzine as well as lorazepam as prescribed by psychiatry.  We will refer to psychology for therapy.  Orders: -     Ambulatory referral to Psychology  Type 2 diabetes mellitus without complication, with long-term current use of insulin  (HCC) Assessment & Plan: Chronic issue.  Check A1c.  Seems to be very well-controlled.  For now she will continue Semglee 13 units daily.  Orders: -     Hemoglobin A1c  Jittery Assessment & Plan: Suspect this is related to anxiety versus her psychiatric medications.  We will check a TSH.  Orders: -     TSH  Globus sensation Assessment & Plan: Chronic issue.  Protonix was not helpful.  Will plan to refer to ENT for further evaluation of this issue.  Orders: -  Ambulatory referral to ENT    Return in about 3 months (around 03/07/2023) for htn.   Marikay Alar, MD [facility]

## 2022-12-05 NOTE — Assessment & Plan Note (Addendum)
Chronic issue.  Reports improvement on her current regimen.  She will continue medications as prescribed by psychiatry Seroquel 50 mg daily, Zoloft 200 mg daily, and hydroxyzine as well as lorazepam as prescribed by psychiatry.  We will refer to psychology for therapy.

## 2022-12-05 NOTE — Assessment & Plan Note (Signed)
Chronic issue.  Above goal at home though she is only checking once weekly.  I have asked her to start checking on a daily basis for a couple of weeks and to send in her readings.  For now she will continue amlodipine 10 mg daily and lisinopril 40 mg daily.

## 2022-12-05 NOTE — Assessment & Plan Note (Signed)
Suspect this is related to anxiety versus her psychiatric medications.  We will check a TSH.

## 2022-12-05 NOTE — Assessment & Plan Note (Signed)
Chronic issue.  Protonix was not helpful.  Will plan to refer to ENT for further evaluation of this issue.

## 2022-12-11 DIAGNOSIS — E119 Type 2 diabetes mellitus without complications: Secondary | ICD-10-CM | POA: Diagnosis not present

## 2022-12-17 DIAGNOSIS — K219 Gastro-esophageal reflux disease without esophagitis: Secondary | ICD-10-CM | POA: Diagnosis not present

## 2023-01-11 DIAGNOSIS — E119 Type 2 diabetes mellitus without complications: Secondary | ICD-10-CM | POA: Diagnosis not present

## 2023-01-23 ENCOUNTER — Ambulatory Visit (INDEPENDENT_AMBULATORY_CARE_PROVIDER_SITE_OTHER): Payer: BC Managed Care – PPO | Admitting: Family Medicine

## 2023-01-23 ENCOUNTER — Encounter: Payer: Self-pay | Admitting: Family Medicine

## 2023-01-23 DIAGNOSIS — M546 Pain in thoracic spine: Secondary | ICD-10-CM

## 2023-01-23 DIAGNOSIS — M25512 Pain in left shoulder: Secondary | ICD-10-CM

## 2023-01-23 DIAGNOSIS — M542 Cervicalgia: Secondary | ICD-10-CM

## 2023-01-23 MED ORDER — MELOXICAM 15 MG PO TABS
15.0000 mg | ORAL_TABLET | Freq: Every day | ORAL | 0 refills | Status: DC | PRN
Start: 2023-01-23 — End: 2023-12-25
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
## 2023-01-23 NOTE — Addendum Note (Signed)
Addended by: Birdie Sons Skyah Hannon G on: 01/23/2023 10:16 AM   Modules accepted: Orders
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
## 2023-02-10 DIAGNOSIS — E119 Type 2 diabetes mellitus without complications: Secondary | ICD-10-CM | POA: Diagnosis not present

## 2023-02-10 DIAGNOSIS — M79674 Pain in right toe(s): Secondary | ICD-10-CM | POA: Diagnosis not present

## 2023-02-10 DIAGNOSIS — R600 Localized edema: Secondary | ICD-10-CM | POA: Diagnosis not present

## 2023-03-10 ENCOUNTER — Ambulatory Visit (INDEPENDENT_AMBULATORY_CARE_PROVIDER_SITE_OTHER): Payer: BC Managed Care – PPO | Admitting: Family Medicine

## 2023-03-10 ENCOUNTER — Encounter: Payer: Self-pay | Admitting: Family Medicine

## 2023-03-10 VITALS — BP 122/78 | HR 61 | Temp 97.9°F | Ht 61.0 in | Wt 168.0 lb

## 2023-03-10 DIAGNOSIS — E119 Type 2 diabetes mellitus without complications: Secondary | ICD-10-CM

## 2023-03-10 DIAGNOSIS — F32A Depression, unspecified: Secondary | ICD-10-CM

## 2023-03-10 DIAGNOSIS — I10 Essential (primary) hypertension: Secondary | ICD-10-CM

## 2023-03-10 DIAGNOSIS — F419 Anxiety disorder, unspecified: Secondary | ICD-10-CM

## 2023-03-10 DIAGNOSIS — W19XXXA Unspecified fall, initial encounter: Secondary | ICD-10-CM

## 2023-03-10 DIAGNOSIS — Z794 Long term (current) use of insulin: Secondary | ICD-10-CM

## 2023-03-10 LAB — LIPID PANEL
Cholesterol: 170 mg/dL (ref 0–200)
HDL: 37.4 mg/dL — ABNORMAL LOW (ref >39.00)
LDL Cholesterol: 100 mg/dL — ABNORMAL HIGH (ref 0–99)
NonHDL: 132.38
Total CHOL/HDL Ratio: 5
Triglycerides: 161 mg/dL — ABNORMAL HIGH (ref 0.0–149.0)
VLDL: 32.2 mg/dL (ref 0.0–40.0)

## 2023-03-10 LAB — HEMOGLOBIN A1C: Hgb A1c MFr Bld: 5.9 % (ref 4.6–6.5)

## 2023-03-10 LAB — COMPREHENSIVE METABOLIC PANEL
ALT: 12 U/L (ref 0–35)
AST: 12 U/L (ref 0–37)
Albumin: 4.1 g/dL (ref 3.5–5.2)
Alkaline Phosphatase: 38 U/L — ABNORMAL LOW (ref 39–117)
BUN: 17 mg/dL (ref 6–23)
CO2: 28 meq/L (ref 19–32)
Calcium: 9 mg/dL (ref 8.4–10.5)
Chloride: 105 meq/L (ref 96–112)
Creatinine, Ser: 1.09 mg/dL (ref 0.40–1.20)
GFR: 64.82 mL/min (ref >60.00)
Glucose, Bld: 111 mg/dL — ABNORMAL HIGH (ref 70–99)
Potassium: 4.2 meq/L (ref 3.5–5.1)
Sodium: 138 meq/L (ref 135–145)
Total Bilirubin: 0.3 mg/dL (ref 0.2–1.2)
Total Protein: 6.5 g/dL (ref 6.0–8.3)

## 2023-03-10 MED ORDER — INSULIN GLARGINE-YFGN 100 UNIT/ML ~~LOC~~ SOPN
13.0000 [IU] | PEN_INJECTOR | Freq: Every day | SUBCUTANEOUS | Status: DC
Start: 2023-03-10 — End: 2023-12-25

## 2023-03-10 NOTE — Assessment & Plan Note (Signed)
Chronic issue.  Depression has improved some though anxiety remains an issue.  Advise she needs to follow-up with her psychiatrist to get input on whether or not they need to change her sertraline to something different.  For now she will continue her Zoloft 200 mg daily and her Seroquel as managed by psychiatry.

## 2023-03-10 NOTE — Assessment & Plan Note (Signed)
Chronic issue.  Check A1c.  I encouraged her to take her Semglee 13 units daily.

## 2023-03-10 NOTE — Progress Notes (Signed)
Marikay Alar, MD Phone: [phone]  Allison [name] is a 37 y.o. female who presents today for f/u.  HYPERTENSION Disease Monitoring Home BP Monitoring typically <130/80 Chest pain- no    Dyspnea- no Medications Compliance-  taking lisinopril, amlodipine.  Edema- no BMET    Component Value Date/Time   NA 140 06/03/2022 1014   NA 136 10/12/2020 1500   NA 138 01/24/2012 1902   K 3.9 06/03/2022 1014   K 4.8 01/24/2012 1902   CL 107 06/03/2022 1014   CL 106 01/24/2012 1902   CO2 25 06/03/2022 1014   CO2 24 01/24/2012 1902   GLUCOSE 95 06/03/2022 1014   GLUCOSE 109 (H) 03/30/2012 1035   BUN 14 06/03/2022 1014   BUN 9 10/12/2020 1500   BUN 6 (L) 01/24/2012 1902   CREATININE 0.72 06/03/2022 1014   CREATININE 0.62 01/24/2012 1902   CALCIUM 9.3 06/03/2022 1014   CALCIUM 8.6 01/24/2012 1902   GFRNONAA 113 05/22/2020 0932   GFRNONAA >60 01/24/2012 1902   GFRAA 131 05/22/2020 0932   GFRAA >60 01/24/2012 1902   Diabetes: Not checking sugars.  She has not really been taking her insulin recently.  She has a lot going on in her life.  Fall: Patient she fell down the stairs right after her last visit.  She fractured her right great toe.  She was in a boot after being evaluated at the walk-in clinic.  She notes her cat tripped her.  She notes the pain has improved significantly in her right great toe.  Anxiety/depression: Patient notes her depression is better.  She continues to have significant anxiety.  She is taking Seroquel and Zoloft.  She is no longer taking Ativan.  She had to cancel her last psychiatry visit and notes she still needs to reschedule.  She feels as though the medications have not been all that helpful for her anxiety.  No SI.  Social History   Tobacco Use  Smoking Status Former   Current packs/day: 1.00   Average packs/day: 1 pack/day for 16.8 years (16.8 ttl pk-yrs)   Types: E-cigarettes, Cigarettes   Start date: 05/14/2006  Smokeless Tobacco Never     Current Outpatient Medications on File Prior to Visit  Medication Sig Dispense Refill   albuterol (VENTOLIN HFA) 108 (90 Base) MCG/ACT inhaler Inhale 2 puffs into the lungs every 6 (six) hours as needed for wheezing or shortness of breath. 8 g 0   amLODipine (NORVASC) 10 MG tablet Take 1 tablet (10 mg total) by mouth daily. 90 tablet 3   blood glucose meter kit and supplies KIT Dispense based on patient and insurance preference. Use 3 times daily as directed. (FOR ICD-10 E11.9). 1 each 0   glucose blood test strip Use as instructed 100 each 11   hydrOXYzine (ATARAX) 10 MG tablet Take 1 tablet (10 mg total) by mouth 3 (three) times daily as needed. 30 tablet 0   ibuprofen (ADVIL) 800 MG tablet Take 1 tablet (800 mg total) by mouth every 8 (eight) hours as needed. 60 tablet 1   lisinopril (ZESTRIL) 40 MG tablet Take 1 tablet (40 mg total) by mouth daily. 90 tablet 3   LORazepam (ATIVAN) 0.5 MG tablet Take 1-2 tablets (0.5-1 mg total) by mouth every 6 (six) hours as needed for anxiety. 60 tablet 0   meloxicam (MOBIC) 15 MG tablet Take 1 tablet (15 mg total) by mouth daily as needed for pain. Take with food. 30 tablet 0   ondansetron (  ZOFRAN) 4 MG tablet Take 1 tablet (4 mg total) by mouth every 8 (eight) hours as needed for nausea or vomiting. 20 tablet 0   QUEtiapine (SEROQUEL) 100 MG tablet Take 0.5 tablets (50 mg total) by mouth at bedtime. 30 tablet 1   rosuvastatin (CRESTOR) 40 MG tablet Take 1 tablet (40 mg total) by mouth daily. 90 tablet 3   sertraline (ZOLOFT) 100 MG tablet Take 2 tablets (200 mg total) by mouth daily. 180 tablet 3   No current facility-administered medications on file prior to visit.     ROS see history of present illness  Objective  Physical Exam Vitals:   03/10/23 0825  BP: 122/78  Pulse: 61  Temp: 97.9 F (36.6 C)  SpO2: 99%    BP Readings from Last 3 Encounters:  03/10/23 122/78  01/23/23 134/84  12/05/22 126/80   Wt Readings from Last 3  Encounters:  03/10/23 168 lb (76.2 kg)  01/23/23 165 lb 9.6 oz (75.1 kg)  12/05/22 173 lb 9.6 oz (78.7 kg)    Physical Exam Constitutional:      General: She is not in acute distress.    Appearance: She is not diaphoretic.  Cardiovascular:     Rate and Rhythm: Normal rate and regular rhythm.     Heart sounds: Normal heart sounds.  Pulmonary:     Effort: Pulmonary effort is normal.     Breath sounds: Normal breath sounds.  Musculoskeletal:     Right lower leg: No edema.     Left lower leg: No edema.  Skin:    General: Skin is warm and dry.  Neurological:     Mental Status: She is alert.      Assessment/Plan: Please see individual problem list.  Benign essential HTN Assessment & Plan: Chronic issue.  Adequately controlled.  She will continue amlodipine 10 mg daily and lisinopril 40 mg daily.  Orders: -     Comprehensive metabolic panel -     Lipid panel  Type 2 diabetes mellitus without complication, with long-term current use of insulin (HCC) Assessment & Plan: Chronic issue.  Check A1c.  I encouraged her to take her Semglee 13 units daily.  Orders: -     Insulin Glargine-yfgn; Inject 13 Units into the skin daily. -     Hemoglobin A1c -     Lipid panel  Anxiety and depression Assessment & Plan: Chronic issue.  Depression has improved some though anxiety remains an issue.  Advise she needs to follow-up with her psychiatrist to get input on whether or not they need to change her sertraline to something different.  For now she will continue her Zoloft 200 mg daily and her Seroquel as managed by psychiatry.   Fall, initial encounter Assessment & Plan: Seems to have had a mechanical fall with tripping over her cat.  She reports she has recovered.  She will watch where she is going when her cat is around.      Health Maintenance: encouraged to see eye doctor once yearly.  Return in about 6 months (around 09/08/2023) for transfer of care.   Marikay Alar,  MD [facility]

## 2023-03-10 NOTE — Assessment & Plan Note (Signed)
Chronic issue.  Adequately controlled.  She will continue amlodipine 10 mg daily and lisinopril 40 mg daily.

## 2023-03-10 NOTE — Assessment & Plan Note (Signed)
Seems to have had a mechanical fall with tripping over her cat.  She reports she has recovered.  She will watch where she is going when her cat is around.

## 2023-03-13 DIAGNOSIS — E119 Type 2 diabetes mellitus without complications: Secondary | ICD-10-CM | POA: Diagnosis not present

## 2023-04-12 DIAGNOSIS — E119 Type 2 diabetes mellitus without complications: Secondary | ICD-10-CM | POA: Diagnosis not present

## 2023-04-20 ENCOUNTER — Other Ambulatory Visit: Payer: Self-pay | Admitting: Family Medicine

## 2023-04-20 ENCOUNTER — Encounter: Payer: Self-pay | Admitting: Obstetrics and Gynecology

## 2023-04-20 DIAGNOSIS — M542 Cervicalgia: Secondary | ICD-10-CM
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
## 2023-04-24 NOTE — Telephone Encounter (Signed)
Left message to call the office back regarding if she is still taking the meloxicam for neck pain?

## 2023-04-28 ENCOUNTER — Telehealth: Payer: Self-pay

## 2023-04-28 NOTE — Telephone Encounter (Signed)
Noted. Please make sure the patient went to urgent care for evaluation.

## 2023-04-28 NOTE — Telephone Encounter (Signed)
UTI last Saturday, now has chills, back on right side is intense, patient feels a little nauseous. Patient has taken enough dayquil and nyquil that she believes it's keeping the fever down.  I transferred call to Access Nurse.
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
## 2023-04-29 ENCOUNTER — Ambulatory Visit
Admission: EM | Admit: 2023-04-29 | Discharge: 2023-04-29 | Disposition: A | Discharge status: Home / Self Care | Payer: BC Managed Care – PPO | Attending: Emergency Medicine | Admitting: Emergency Medicine

## 2023-04-29 DIAGNOSIS — R1030 Lower abdominal pain, unspecified: Secondary | ICD-10-CM | POA: Insufficient documentation

## 2023-04-29 DIAGNOSIS — R300 Dysuria: Secondary | ICD-10-CM | POA: Diagnosis not present

## 2023-04-29 DIAGNOSIS — M545 Low back pain, unspecified: Secondary | ICD-10-CM | POA: Insufficient documentation

## 2023-04-29 LAB — POCT URINALYSIS DIP (MANUAL ENTRY)
Bilirubin, UA: NEGATIVE
Glucose, UA: NEGATIVE mg/dL
Ketones, POC UA: NEGATIVE mg/dL
Nitrite, UA: NEGATIVE
Protein Ur, POC: NEGATIVE mg/dL
Spec Grav, UA: 1.01 (ref 1.010–1.025)
Urobilinogen, UA: 0.2 U/dL
pH, UA: 5.5 (ref 5.0–8.0)

## 2023-04-29 LAB — POCT URINE PREGNANCY: Preg Test, Ur: NEGATIVE

## 2023-04-29 MED ORDER — CEPHALEXIN 500 MG PO CAPS: 500.0000 mg | ORAL_CAPSULE | Freq: Two times a day (BID) | ORAL | 0 refills | Status: AC

## 2023-04-29 NOTE — Discharge Instructions (Addendum)
Take the antibiotic as directed.  The urine culture is pending.  We will call you if it shows the need to change or discontinue your antibiotic.    Follow up with your primary care provider if your symptoms are not improving.    

## 2023-04-29 NOTE — ED Triage Notes (Signed)
Patient to Urgent Care with complaints of back pain/ possible fevers- chills & sweats/ dysuria.   Symptoms started Saturday. Started taking Nyquil (son sick w/ a cold).
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
## 2023-05-01 LAB — URINE CULTURE: Culture: >=100000 — AB

## 2023-05-13 DIAGNOSIS — E119 Type 2 diabetes mellitus without complications: Secondary | ICD-10-CM | POA: Diagnosis not present

## 2023-06-13 DIAGNOSIS — E119 Type 2 diabetes mellitus without complications: Secondary | ICD-10-CM | POA: Diagnosis not present

## 2023-07-11 DIAGNOSIS — E119 Type 2 diabetes mellitus without complications: Secondary | ICD-10-CM | POA: Diagnosis not present

## 2023-07-27 ENCOUNTER — Telehealth: Payer: Self-pay | Admitting: Family Medicine

## 2023-07-27 NOTE — Telephone Encounter (Signed)
 Dr Birdie Sons is no longer at this location. Please call the office to schedule a Transfer of Care to either Dr Charlann Lange, Darleen Crocker or Kara Dies, NP. Purvis Sheffield, please schedule a TOC visit for thie patient. [facility]   Thank you

## 2023-08-11 DIAGNOSIS — E119 Type 2 diabetes mellitus without complications: Secondary | ICD-10-CM | POA: Diagnosis not present

## 2023-09-10 DIAGNOSIS — E119 Type 2 diabetes mellitus without complications: Secondary | ICD-10-CM | POA: Diagnosis not present

## 2023-10-11 DIAGNOSIS — E119 Type 2 diabetes mellitus without complications: Secondary | ICD-10-CM | POA: Diagnosis not present

## 2023-11-10 DIAGNOSIS — E119 Type 2 diabetes mellitus without complications: Secondary | ICD-10-CM | POA: Diagnosis not present

## 2023-12-11 DIAGNOSIS — E119 Type 2 diabetes mellitus without complications: Secondary | ICD-10-CM | POA: Diagnosis not present

## 2023-12-25 ENCOUNTER — Ambulatory Visit: Admitting: Family

## 2023-12-25 ENCOUNTER — Encounter: Payer: Self-pay | Admitting: Family

## 2023-12-25 VITALS — BP 162/90 | HR 78 | Temp 98.0°F | Ht 61.0 in | Wt 181.0 lb

## 2023-12-25 DIAGNOSIS — E782 Mixed hyperlipidemia: Secondary | ICD-10-CM

## 2023-12-25 DIAGNOSIS — I10 Essential (primary) hypertension: Secondary | ICD-10-CM | POA: Diagnosis not present

## 2023-12-25 DIAGNOSIS — Z1509 Genetic susceptibility to other malignant neoplasm: Secondary | ICD-10-CM | POA: Insufficient documentation

## 2023-12-25 DIAGNOSIS — F17200 Nicotine dependence, unspecified, uncomplicated: Secondary | ICD-10-CM | POA: Insufficient documentation

## 2023-12-25 DIAGNOSIS — Z794 Long term (current) use of insulin: Secondary | ICD-10-CM | POA: Diagnosis not present

## 2023-12-25 DIAGNOSIS — Z6834 Body mass index (BMI) 34.0-34.9, adult: Secondary | ICD-10-CM

## 2023-12-25 DIAGNOSIS — R635 Abnormal weight gain: Secondary | ICD-10-CM

## 2023-12-25 DIAGNOSIS — E119 Type 2 diabetes mellitus without complications: Secondary | ICD-10-CM

## 2023-12-25 DIAGNOSIS — F418 Other specified anxiety disorders: Secondary | ICD-10-CM | POA: Diagnosis not present

## 2023-12-25 DIAGNOSIS — E66811 Obesity, class 1: Secondary | ICD-10-CM | POA: Insufficient documentation

## 2023-12-25 DIAGNOSIS — E661 Drug-induced obesity: Secondary | ICD-10-CM

## 2023-12-25 DIAGNOSIS — E785 Hyperlipidemia, unspecified: Secondary | ICD-10-CM

## 2023-12-25 DIAGNOSIS — F419 Anxiety disorder, unspecified: Secondary | ICD-10-CM

## 2023-12-25 DIAGNOSIS — F32A Depression, unspecified: Secondary | ICD-10-CM

## 2023-12-25 LAB — LIPID PANEL
Cholesterol: 193 mg/dL (ref 0–200)
HDL: 37.1 mg/dL — ABNORMAL LOW (ref >39.00)
LDL Cholesterol: 90 mg/dL (ref 0–99)
NonHDL: 155.7
Total CHOL/HDL Ratio: 5
Triglycerides: 330 mg/dL — ABNORMAL HIGH (ref 0.0–149.0)
VLDL: 66 mg/dL — ABNORMAL HIGH (ref 0.0–40.0)

## 2023-12-25 LAB — MICROALBUMIN / CREATININE URINE RATIO
Creatinine,U: 173.2 mg/dL
Microalb Creat Ratio: 10 mg/g (ref 0.0–30.0)
Microalb, Ur: 1.7 mg/dL (ref 0.0–1.9)

## 2023-12-25 LAB — T3, FREE: T3, Free: 3.1 pg/mL (ref 2.3–4.2)

## 2023-12-25 LAB — T4, FREE: Free T4: 0.7 ng/dL (ref 0.60–1.60)

## 2023-12-25 LAB — TSH: TSH: 0.79 u[IU]/mL (ref 0.35–5.50)

## 2023-12-25 LAB — HEMOGLOBIN A1C: Hgb A1c MFr Bld: 6.7 % — ABNORMAL HIGH (ref 4.6–6.5)

## 2023-12-25 MED ORDER — AMLODIPINE BESYLATE 10 MG PO TABS
10.0000 mg | ORAL_TABLET | Freq: Every day | ORAL | 3 refills | Status: AC
Start: 2023-12-25 — End: ?

## 2023-12-25 MED ORDER — LISINOPRIL 40 MG PO TABS: 40.0000 mg | ORAL_TABLET | Freq: Every day | ORAL | 3 refills | Status: DC

## 2023-12-25 MED ORDER — ROSUVASTATIN CALCIUM 40 MG PO TABS
40.0000 mg | ORAL_TABLET | Freq: Every day | ORAL | 3 refills | Status: AC
Start: 2023-12-25 — End: ?

## 2023-12-25 NOTE — Progress Notes (Signed)
 Established Patient Office Visit  Subjective:      CC:  Chief Complaint  Patient presents with   Acute Visit    HPI: Allison [name] is a 38 y.o. female presenting on 12/25/2023 for Acute Visit   Discussed the use of AI scribe software for clinical note transcription with the patient, who gave verbal consent to proceed.  History of Present Illness   Lab Results  Component Value Date   CHOL 170 03/10/2023   HDL 37.40 (L) 03/10/2023   LDLCALC 100 (H) 03/10/2023   LDLDIRECT 50.0 10/29/2021   TRIG 161.0 (H) 03/10/2023   CHOLHDL 5 03/10/2023     Wt Readings from Last 3 Encounters:  12/25/23 181 lb (82.1 kg)  03/10/23 168 lb (76.2 kg)  01/23/23 165 lb 9.6 oz (75.1 kg)      [facility] Allison [name] is a 38 year old female with hypertension and diabetes who presents with a persistent heartbeat sound in her ear and concerns about her blood pressure.  She experiences a persistent heartbeat sound in her left ear, which she associates with her blood pressure. She has not been on her regular blood pressure medications, lisinopril  and amlodipine , due to running out of them. She has taken lisinopril  twice recently but notes it has not alleviated the symptoms. No chest pain, palpitations, shortness of breath, headache, or blurry vision.  She has a history of hypertension and diabetes, for which she was previously on insulin  and Ozempic . She stopped taking Ozempic  due to pharmacy shortages and adverse reactions when restarting at a higher dose. She has not been monitoring her blood sugar levels recently.  She has gained approximately 20 pounds despite maintaining her usual diet and physical activity level. She notes swelling in her feet and attributes some weight gain to decreased activity during the summer. She previously lost significant weight but has been 'ping ponging' between weights recently.  She has a history of anxiety and depression, which she attributes to  stress from her job and family issues. She previously took sertraline  but has stopped. She reports improvement in depression but ongoing anxiety, particularly related to her work environment and family dynamics.  She reports being told she has Lynch syndrome based on genetic testing performed after her mother's breast cancer diagnosis. She had a colonoscopy two years ago, possibly related to her high-risk status.  She has a history of carpal tunnel syndrome, for which she had surgery on one hand. The other hand occasionally 'seizes up,' but she attributes this to aging.  She vapes with a nicotine concentration of 3% and does not smoke cigarettes or drink alcohol. She works at [employer], where she is active, walking approximately 10,000 steps a night.         Social history:  Relevant past medical, surgical, family and social history reviewed and updated as indicated. Interim medical history since our last visit reviewed.  Allergies and medications reviewed and updated.  DATA REVIEWED: CHART IN EPIC     ROS: Negative unless specifically indicated above in HPI.    Current Outpatient Medications:    amLODipine  (NORVASC ) 10 MG tablet, Take 1 tablet (10 mg total) by mouth daily., Disp: 90 tablet, Rfl: 3   hydrOXYzine  (ATARAX ) 10 MG tablet, Take 1 tablet (10 mg total) by mouth 3 (three) times daily as needed. (Patient not taking: Reported on 12/25/2023), Disp: 30 tablet, Rfl: 0   lisinopril  (ZESTRIL ) 40 MG tablet, Take 1 tablet (40 mg total) by  mouth daily., Disp: 90 tablet, Rfl: 3   rosuvastatin  (CRESTOR ) 40 MG tablet, Take 1 tablet (40 mg total) by mouth daily., Disp: 90 tablet, Rfl: 3        Objective:        BP (!) 162/90   Pulse 78   Temp 98 F (36.7 C) (Temporal)   Ht 5' 1 (1.549 m)   Wt 181 lb (82.1 kg)   LMP 12/20/2023 (Exact Date)   SpO2 98%   BMI 34.20 kg/m   Physical Exam VITALS: BP- 165/92  Wt Readings from Last 3 Encounters:  12/25/23 181 lb (82.1 kg)   03/10/23 168 lb (76.2 kg)  01/23/23 165 lb 9.6 oz (75.1 kg)    Physical Exam       Results Colonoscopy: Performed due to high risk of Lynch syndrome.  Assessment & Plan:   Assessment and Plan Assessment & Plan Hypertension Hypertension with symptoms of pulsatile tinnitus in the left ear. Blood pressure is 165/92 mmHg. Previously on lisinopril  and amlodipine  but inconsistent due to running out. No chest pain, palpitations, shortness of breath, headache, or blurry vision reported. - Refill lisinopril  and amlodipine . - Instruct to take half tablet of amlodipine  and monitor blood pressure. - If blood pressure consistently above 140/90 mmHg, increase amlodipine  to full dose. - Keep a blood pressure log and bring it to the next appointment.  Type 2 diabetes mellitus Type 2 diabetes mellitus with previous use of insulin  and Ozempic . Currently not on any diabetes medication due to transition between doctors. Awaiting A1c results to determine further management. Previous issues with Ozempic  due to pharmacy supply and medication interactions. - Order A1c test. - Consider restarting Ozempic  or similar medication based on A1c results. - Evaluate potential to reduce insulin  use if starting Ozempic  or similar medication.  Hyperlipidemia Hyperlipidemia previously managed with medication, currently not on treatment due to running out of medication. - Refill cholesterol medication.  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 Increased risk for certain cancers including endometrial and colorectal cancer. No current follow-up with a geneticist. - Refer to a geneticist for further evaluation and management of Lynch syndrome. - Request she provide genetic test results for confirmation and further referral.  Anxiety and depression Anxiety persists, likely exacerbated by work-related stress and family issues. Previously on  sertraline  but currently off medication. No current psychiatric follow-up. Improvement in depressive symptoms noted.  Obesity Obesity with recent weight gain of approximately 20 pounds despite no significant dietary changes. Previously lost weight but has been fluctuating. Possible thyroid  involvement considered. - Order thyroid  function tests to rule out thyroid  dysfunction as a cause of weight gain.  Recording duration: 26 minutes      Return in about 3 weeks (around 01/15/2024) for f/u blood pressure and TOC appt (needs 40 min appt).     Ginger Patrick, MSN, APRN, FNP-C [place] [facility]

## 2023-12-30 ENCOUNTER — Ambulatory Visit: Admitting: Family

## 2023-12-31 ENCOUNTER — Encounter: Payer: Self-pay | Admitting: Family

## 2023-12-31 ENCOUNTER — Other Ambulatory Visit (HOSPITAL_COMMUNITY): Payer: Self-pay

## 2023-12-31 ENCOUNTER — Ambulatory Visit: Payer: Self-pay | Admitting: Family

## 2023-12-31 ENCOUNTER — Telehealth: Payer: Self-pay | Admitting: Family

## 2023-12-31 ENCOUNTER — Ambulatory Visit (INDEPENDENT_AMBULATORY_CARE_PROVIDER_SITE_OTHER): Admitting: Family

## 2023-12-31 ENCOUNTER — Telehealth: Payer: Self-pay

## 2023-12-31 VITALS — BP 142/90 | HR 85 | Temp 98.2°F | Ht 61.0 in | Wt 181.0 lb

## 2023-12-31 DIAGNOSIS — E119 Type 2 diabetes mellitus without complications: Secondary | ICD-10-CM

## 2023-12-31 DIAGNOSIS — Z794 Long term (current) use of insulin: Secondary | ICD-10-CM

## 2023-12-31 DIAGNOSIS — Z1509 Genetic susceptibility to other malignant neoplasm: Secondary | ICD-10-CM

## 2023-12-31 DIAGNOSIS — R002 Palpitations: Secondary | ICD-10-CM | POA: Insufficient documentation

## 2023-12-31 DIAGNOSIS — Z1371 Encounter for nonprocreative screening for genetic disease carrier status: Secondary | ICD-10-CM | POA: Diagnosis not present

## 2023-12-31 DIAGNOSIS — I10 Essential (primary) hypertension: Secondary | ICD-10-CM | POA: Diagnosis not present

## 2023-12-31 DIAGNOSIS — F32A Depression, unspecified: Secondary | ICD-10-CM

## 2023-12-31 DIAGNOSIS — Z1501 Genetic susceptibility to malignant neoplasm of breast: Secondary | ICD-10-CM | POA: Insufficient documentation

## 2023-12-31 DIAGNOSIS — F419 Anxiety disorder, unspecified: Secondary | ICD-10-CM

## 2023-12-31 MED ORDER — SERTRALINE HCL 50 MG PO TABS: 50.0000 mg | ORAL_TABLET | Freq: Every day | ORAL | 1 refills | Status: AC

## 2023-12-31 MED ORDER — OZEMPIC (0.25 OR 0.5 MG/DOSE) 2 MG/1.5ML ~~LOC~~ SOPN: PEN_INJECTOR | SUBCUTANEOUS | 0 refills | Status: AC

## 2023-12-31 MED ORDER — OZEMPIC (0.25 OR 0.5 MG/DOSE) 2 MG/3ML ~~LOC~~ SOPN: 0.5000 mg | PEN_INJECTOR | SUBCUTANEOUS | 0 refills | Status: DC

## 2023-12-31 MED ORDER — VALSARTAN 320 MG PO TABS: 320.0000 mg | ORAL_TABLET | Freq: Every day | ORAL | 1 refills | Status: DC

## 2023-12-31 NOTE — Telephone Encounter (Signed)
 Pharmacy Patient Advocate Encounter  Received notification from [employer] that Prior Authorization for Ozempic  2 has been APPROVED from 12/31/23 to 12/30/24. Ran test claim, Copay is $24.99. This test claim was processed through [facility]- copay amounts may vary at other pharmacies due to pharmacy/plan contracts, or as the patient moves through the different stages of their insurance plan.   PA #/Case ID/Reference #: # [id number]

## 2023-12-31 NOTE — Progress Notes (Signed)
 Established Patient Office Visit  Subjective:      CC:  Chief Complaint  Patient presents with   Establish Care    HPI: Allison [name] is a 38 y.o. female presenting on 12/31/2023 for Establish Care .  Discussed the use of AI scribe software for clinical note transcription with the patient, who gave verbal consent to proceed.  History of Present Illness [facility] Allison [name] is a 38 year old female with hypertension who presents with elevated blood pressure readings.  She has been experiencing elevated blood pressure readings, with systolic measurements reaching as high as 165 mmHg, although some readings have been around 145 mmHg. She is currently on lisinopril  40 mg and amlodipine  10 mg. Her blood pressure was particularly high this morning.  She experiences significant anxiety, especially when preparing for work, describing it as 'a lot of anxiety' and 'heart fluttering.' She has a history of taking sertraline  for anxiety, which was effective, but she discontinued it due to taking an incorrect dose that made her feel unwell.  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 She has a family history of breast cancer, which prompted testing for BRCA1 and BRCA2, both of which were negative.  She reports a challenging work environment at [employer], where she feels bullied by coworkers and is concerned about the impact on her mental health. She describes a situation where coworkers sleep during shifts, which she does not participate in, leading to tension.  Her A1c level has increased to 6.7%. She has previously been on Ozempic , which was effective, but had to stop due to interactions with other medications.         Social history:  Relevant past medical, surgical, family and social history reviewed and updated as indicated. Interim medical history since our last visit reviewed.  Allergies and medications reviewed  and updated.  DATA REVIEWED: CHART IN EPIC     ROS: Negative unless specifically indicated above in HPI.    Current Outpatient Medications:    amLODipine  (NORVASC ) 10 MG tablet, Take 1 tablet (10 mg total) by mouth daily., Disp: 90 tablet, Rfl: 3   rosuvastatin  (CRESTOR ) 40 MG tablet, Take 1 tablet (40 mg total) by mouth daily., Disp: 90 tablet, Rfl: 3   Semaglutide ,0.25 or 0.5MG /DOS, (OZEMPIC , 0.25 OR 0.5 MG/DOSE,) 2 MG/1.5ML SOPN, Inject 0.25 mg into the skin once a week for 30 days, THEN 0.5 mg once a week., Disp: 2.25 mL, Rfl: 0   Semaglutide ,0.25 or 0.5MG /DOS, (OZEMPIC , 0.25 OR 0.5 MG/DOSE,) 2 MG/3ML SOPN, Inject 0.5 mg into the skin once a week., Disp: 3 mL, Rfl: 0   sertraline  (ZOLOFT ) 50 MG tablet, Take 1 tablet (50 mg total) by mouth daily., Disp: 30 tablet, Rfl: 1   valsartan  (DIOVAN ) 320 MG tablet, Take 1 tablet (320 mg total) by mouth daily., Disp: 30 tablet, Rfl: 1   hydrOXYzine  (ATARAX ) 10 MG tablet, Take 1 tablet (10 mg total) by mouth 3 (three) times daily as needed. (Patient not taking: Reported on 12/31/2023), Disp: 30 tablet, Rfl: 0        Objective:        BP (!) 142/90   Pulse 85   Temp 98.2 F (36.8 C) (Temporal)   Ht 5' 1 (1.549 m)   Wt 181 lb (82.1 kg)   LMP 12/20/2023 (Exact Date)   SpO2 98%   BMI 34.20 kg/m   Physical Exam VITALS: P- 81, BP- 142/90 CARDIOVASCULAR:  Normal sinus rhythm, heart rate 81 bpm.  Wt Readings from Last 3 Encounters:  12/31/23 181 lb (82.1 kg)  12/25/23 181 lb (82.1 kg)  03/10/23 168 lb (76.2 kg)    Physical Exam       Results LABS A1c: 6.7  DIAGNOSTIC EKG: Normal sinus rhythm, heart rate 81 bpm (12/31/2023)  PATHOLOGY BRCA1 and BRCA2: Negative PMS2 variant: Variant of uncertain significance, associated with Lynch syndrome  Assessment & Plan:   Assessment and Plan Assessment & Plan Hypertension with palpitations Blood pressure remains elevated with systolic values up to 165 mmHg. Current regimen  includes lisinopril  40 mg and amlodipine  10 mg. Lisinopril  is at maximum dose without achieving desired control. Palpitations may be related to anxiety or hypertension. EKG shows normal sinus rhythm. Anxiety and palpitations may be exacerbated by work-related stress. - Discontinue lisinopril  and initiate valsartan . - Continue amlodipine  10 mg daily. - Performed EKG today NSR 81 bpm inversion t wave non specific V1 - Consider heart monitor if palpitations persist after addressing blood pressure and anxiety.  Type 2 diabetes mellitus A1c has increased to 6.7%, indicating a return to diabetic range. Previous use of Ozempic  was effective but discontinued due to interaction with another medication. - Restart Ozempic , beginning at 0.25 mg and titrate up every four weeks as tolerated. - Monitor blood glucose levels.  Hyperlipidemia Triglycerides are elevated. Discussed potential benefit of fish oil supplementation. - Consider starting fish oil supplementation.  Anxiety disorder Anxiety symptoms have worsened, contributing to palpitations and elevated blood pressure. Previous use of sertraline  was beneficial but discontinued due to dosing issues. Work-related stress is a significant factor in anxiety exacerbation. - Initiate sertraline  at 50 mg, starting with half a tablet for the first week, then increase to one tablet daily. - Delay starting sertraline  for 2-3 days after initiating valsartan  to monitor for side effects.  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 Previous BRCA1 and BRCA2 testing was negative. Uncertainty remains regarding the significance of the PMS2 variant. - Refer to genetics for further evaluation and counseling regarding PMS2 variant and Lynch syndrome.  Recording duration: 23 minutes      Return in about 3 months (around 04/01/2024) for f/u blood pressure, f/u  diabetes.     Ginger Patrick, MSN, APRN, FNP-C [facility]

## 2023-12-31 NOTE — Telephone Encounter (Signed)
 Can we start prior auth for ozempic 

## 2023-12-31 NOTE — Telephone Encounter (Signed)
 Pharmacy Patient Advocate Encounter   Received notification from Pt Calls Messages that prior authorization for Ozempic  2 is required/requested.   Insurance verification completed.   The patient is insured through [facility] .   Per test claim: PA required; PA submitted to above mentioned insurance via Latent Key/confirmation #/EOC [id number] Status is pending

## 2023-12-31 NOTE — Patient Instructions (Signed)
------------------------------------    Start sertraline 50 mg for anxiety and depression. Take 1/2 tablet by mouth once daily for about one week, then increase to 1 full tablet thereafter.   Taking the medicine as directed and not missing any doses is one of the best things you can do to treat your anxiety/depression.  Here are some things to keep in mind:  Side effects (stomach upset, some increased anxiety) may happen before you notice a benefit.  These side effects typically go away over time. Changes to your dose of medicine or a change in medication all together is sometimes necessary Many people will notice an improvement within two weeks but the full effect of the medication can take up to 4-6 weeks Stopping the medication when you start feeling better often results in a return of symptoms. Most people need to be on medication at least 6-12 months If you start having thoughts of hurting yourself or others after starting this medicine, please call me immediately.    ------------------------------------

## 2024-01-11 DIAGNOSIS — E119 Type 2 diabetes mellitus without complications: Secondary | ICD-10-CM | POA: Diagnosis not present

## 2024-01-18 ENCOUNTER — Ambulatory Visit: Admitting: Family

## 2024-01-28 ENCOUNTER — Encounter: Payer: Self-pay | Admitting: Genetic Counselor

## 2024-02-10 DIAGNOSIS — E119 Type 2 diabetes mellitus without complications: Secondary | ICD-10-CM | POA: Diagnosis not present

## 2024-02-18 ENCOUNTER — Telehealth: Payer: Self-pay | Admitting: Genetic Counselor

## 2024-02-18 NOTE — Telephone Encounter (Signed)
 Patient cancelled appointment via text message.  Called to r/s appointment.  Left VM to have patient call back.  Left CB instructions.

## 2024-02-22 ENCOUNTER — Inpatient Hospital Stay

## 2024-03-12 DIAGNOSIS — E119 Type 2 diabetes mellitus without complications: Secondary | ICD-10-CM | POA: Diagnosis not present

## 2024-03-14 ENCOUNTER — Other Ambulatory Visit: Payer: Self-pay | Admitting: Family

## 2024-03-14 DIAGNOSIS — Z794 Long term (current) use of insulin: Secondary | ICD-10-CM

## 2024-03-16 ENCOUNTER — Inpatient Hospital Stay

## 2024-03-16 ENCOUNTER — Inpatient Hospital Stay: Attending: Internal Medicine | Admitting: Genetic Counselor

## 2024-03-16 ENCOUNTER — Encounter: Payer: Self-pay | Admitting: Genetic Counselor

## 2024-03-16 DIAGNOSIS — Z803 Family history of malignant neoplasm of breast: Secondary | ICD-10-CM | POA: Insufficient documentation

## 2024-03-16 DIAGNOSIS — Z1379 Encounter for other screening for genetic and chromosomal anomalies: Secondary | ICD-10-CM | POA: Insufficient documentation

## 2024-03-16 NOTE — Progress Notes (Signed)
 REFERRING PROVIDER: Corwin Antu, FNP [address] [place],  [place] [address]  PRIMARY REASON FOR VISIT:  Family history of malignant neoplasm of breast   HISTORY OF PRESENT ILLNESS:   Allison [name], a 38 y.o. female, was seen for a [facility] cancer genetics consultation at the request of Corwin Antu, FNP due to a family history of cancer.  Allison [name] presents to clinic today to discuss the possibility of a hereditary predisposition to cancer, to discuss results of genetic testing, and to further clarify her future cancer risks, as well as potential cancer risks for family members.   Allison [name] is a 38 y.o. female with no personal history of cancer.    RELEVANT MEDICAL HISTORY:  Menarche was at age 78.  First live birth at age 20.  OCP use for approximately 0 years.  Ovaries intact: yes.  Uterus intact: yes.  Menopausal status: premenopausal.  HRT use: 0 years. Colonoscopy: yes; reports completed in 2023, normal. Mammogram within the last year: yes. Reports completed in 2023, normal.  Number of breast biopsies: 0.   Past Medical History:  Diagnosis Date   Anxiety    Depression    Diabetes mellitus without complication (HCC)    Diabetes mellitus, type II (HCC)    Hypertension     Past Surgical History:  Procedure Laterality Date   CARPAL TUNNEL RELEASE     CHOLECYSTECTOMY  2003   TUBAL LIGATION      Social History   Socioeconomic History   Marital status: Married    Spouse name: Richard   Number of children: 2   Years of education: 14   Highest education level: Some college, no degree  Occupational History   Occupation: Physical Department/Environmental Chief Financial Officer: [employer]  Tobacco Use   Smoking status: Former    Current packs/day: 1.00    Average packs/day: 1 pack/day for 17.8 years (17.8 ttl pk-yrs)    Types: E-cigarettes, Cigarettes    Start date: 05/14/2006   Smokeless tobacco: Never  Vaping Use   Vaping status: Every Day  Substance  and Sexual Activity   Alcohol use: Yes    Alcohol/week: 0.0 standard drinks of alcohol    Comment: Rare    Drug use: Yes    Types: Marijuana   Sexual activity: Yes    Birth control/protection: Surgical  Other Topics Concern   Not on file  Social History Narrative   [name] grew up in [place], [place]. She is married and has 2 children. She enjoys spending time with her family. She enjoys reading and shopping. Loves music. Plays video games.       Works at [employer], 3rd shift, cleaning      Is vaping, doesn't do it much.    Caffeine - drinks 3 cans of diet pepsi daily, takes maybe 7 BC powders per day.    Alcohol - rare   Legal-none   Religion-none      Son age 7 autism, finn   93 year old daughter (2025)    Social Drivers of Corporate Investment Banker Strain: High Risk (09/22/2022)   Overall Financial Resource Strain (CARDIA)    Difficulty of Paying Living Expenses: Hard  Food Insecurity: No Food Insecurity (09/22/2022)   Hunger Vital Sign    Worried About Running Out of Food in the Last Year: Never true    Ran Out of Food in the Last Year: Never true  Transportation Needs: No Transportation  Needs (08/13/2022)   PRAPARE - Administrator, Civil Service (Medical): No    Lack of Transportation (Non-Medical): No  Physical Activity: Sufficiently Active (09/22/2022)   Exercise Vital Sign    Days of Exercise per Week: 3 days    Minutes of Exercise per Session: 60 min  Recent Concern: Physical Activity - Insufficiently Active (08/13/2022)   Exercise Vital Sign    Days of Exercise per Week: 4 days    Minutes of Exercise per Session: 30 min  Stress: Stress Concern Present (09/22/2022)   [employer] of Occupational Health - Occupational Stress Questionnaire    Feeling of Stress : Very much  Social Connections: Socially Isolated (09/22/2022)   Social Connection and Isolation Panel    Frequency of Communication with Friends and Family: Never    Frequency of  Social Gatherings with Friends and Family: Never    Attends Religious Services: Never    Database Administrator or Organizations: No    Attends Engineer, Structural: Never    Marital Status: Married     FAMILY HISTORY:  We obtained a detailed, 4-generation family history.  Significant diagnoses are listed below: Family History  Adopted: Yes  Problem Relation Age of Onset   Diabetes Mother    Hypertension Mother    Breast cancer Mother 36   Multiple sclerosis Adoptive Father 21       Progressive - not diagnosed until age 40   Healthy Half-Sister    Alcohol abuse Maternal Grandmother    Hypertension Maternal Grandfather    Diabetes Maternal Grandfather    Healthy Daughter    Healthy Son     Allison [name] is unaware of previous family history of genetic testing for hereditary cancer risks There is no reported Ashkenazi Jewish ancestry. Information limited on biologic father's health and family history due to misattributed paternity that Ms. Confer learned about as an adult.     GENETIC TEST RESULTS: Genetic testing was ordered by Allison [name]'s previous OBGYN, Dr. Connell and reported out on 11/28/21. The [facility] MultiCancer panel found no pathogenic mutations. [facility]'s 84 gene MultiCancer panel included analysis of the following genes: AIP, ALK, APC, ATM, AXIN2, BAP1, BARD1, BLM, BMPR1A, BRCA1, BRCA2, BRIP1, CASR, CDC73, CDH1, CDK4, CDKN1B, CDKN1C, CDKN2A, CEPBA, CHEK2, CTNNA1, DICER1, DIS3L2,  EGFR, EPCAM, FH, FLCN, GATA2, GPC3, GREM1, HOXB13, HRAS, KIT, MAX, MEN1, MET, MITF, MLH1, MSH2, MSH3, MSH6, MUTYH, NBN, NF1, NF2, NTHL1, PALB2, PDGFRA, PHOXB2, PMS2, POLD1, POLE, POT1, PRKAR1A, PTCH1, PTEN, RAD50, RAD51C, RAD51D, RB1, RET, RUNX1, SDHA, SDHAF2, SDHB, SDHC, SDHD, SMAD4, SMARCA4, SMARCB1, SMARCE1, STK11, SUFU, TERC, TERT, TMEM127, TP53, TSC1, TSC2, VHL, WRN, WT1. The test report has been scanned into EPIC and is located under the media tab.  A portion of the result report is included  below for reference.     We discussed with Allison [name] that because current genetic testing is not perfect, it is possible there may be a gene mutation in one of these genes that current testing cannot detect, but that chance is small.  We also discussed, that there could be another gene that has not yet been discovered, or that we have not yet tested, that is responsible for the cancer diagnoses in the family. It is also possible there is a hereditary cause for the cancer in the family that Allison [name] did not inherit and therefore was not identified in her testing.  Therefore, it is important to remain in touch with cancer genetics in  the future so that we can continue to offer Allison [name] the most up to date genetic testing.   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  At this time, it is unknown if this variant is associated with increased cancer risk or if this is a normal finding, but most variants such as this get reclassified to being inconsequential. It should not be used to make medical management decisions. With time, we suspect the lab will determine the significance of this variant, if any. If we do learn more about it, we will try to contact Allison [name] to discuss it further. However, it is important to stay in touch with us  periodically and keep the address and phone number up to date.  Based on this result, there is NOT evidence that Allison [name] had Lynch syndrome. 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 We do not use VUS to make management decisions or identify at risk family members. Allison [name] should follow general population recommendations for colonoscopies at this time.   We discussed that VUS can be more common in individuals of non-European ancestry, given that the reference sequence used in the  analysis is derived primarily from individuals of European ancestry.   ADDITIONAL GENETIC TESTING: We discussed with Allison [name] that her genetic testing was fairly extensive.  If there are genes identified to increase cancer risk that can be analyzed in the future, we would be happy to discuss and coordinate this testing at that time.    Given the limited family history information, Allison [name] inquired about other options for genetic testing. We discussed she could consider [facility]'s Genetic Risk panel, which evaluated for 163 genes associated with actionable conditions, including hereditary cancer, cardiovascular disease, metabolic disease, and others. Additionally, if interested in research participation, we discussed the [employer] research study hosted by [employer].   CANCER SCREENING RECOMMENDATIONS: Allison [name] test result is considered negative (normal).  This means that we have not identified a hereditary cause for her family history of cancer at this time. Most cancers happen by chance (as only 5-10% of all cancer are identified to be hereditary) and this negative test suggests that her family history of cancer may fall into this category.    Possible reasons for Allison [name]'s negative genetic test include:  1. There may be a gene mutation in one of these genes that current testing methods cannot detect but that chance is small.  2. There could be another gene that has not yet been discovered, or that we have not yet tested, that is responsible for the cancer diagnoses in the family.  3.  There may be no hereditary risk for cancer in the family. The cancers in Allison [name] and/or her family may be sporadic/familial or due to other genetic and environmental factors. 4. It is also possible there is a hereditary cause for the cancer in the family that Allison [name] did not inherit.  Therefore, it is recommended she continue to follow the cancer management and screening guidelines provided by her primary  healthcare provider. An individual's cancer risk and medical management are not determined by genetic test results alone. Overall cancer risk assessment incorporates additional factors, including personal medical history, family history, and any available genetic information that may result in a personalized plan for cancer prevention and surveillance  We do recommend running a breast cancer risk model to determine if Allison [name] would qualify for increased breast cancer surveillance. She declined to complete at this time, but was encouraged to contact us  for this information in the future.    RECOMMENDATIONS FOR FAMILY MEMBERS:  Individuals in this family might be at some increased risk of developing cancer, over the general population risk, simply due to the family history of cancer.  We recommended women in this family have a yearly mammogram beginning at age 53, or 18 years younger than the earliest onset of cancer, an annual clinical breast exam, and perform monthly breast self-exams. Women in this family should also have a gynecological exam as recommended by their primary provider. All family members should be referred for colonoscopy starting at age 44, or 10 years younger than the earliest onset of cancer.  It is also possible there is a hereditary cause for the cancer in Ms. Newburn's family that she did not inherit and therefore was not identified in her.  Based on Ms. Mullens's family history, we recommended her mother, who was diagnosed with breast cancer at age 70, have genetic counseling and testing. Ms. Brandvold will let us  know if we can be of any assistance in coordinating genetic counseling and/or testing for this family member.   FOLLOW-UP: Lastly, we discussed with Ms. Koo that cancer genetics is a rapidly advancing field and it is possible that new genetic tests will be appropriate for her and/or her family members in the future. We encouraged her to remain in contact with cancer genetics on an  annual basis so we can update her personal and family histories and let her know of advances in cancer genetics that may benefit this family.   Our contact number was provided. Ms. Liszewski questions were answered to her satisfaction, and she knows she is welcome to call us  at anytime with additional questions or concerns.   Burnard Ogren, MS, [facility] Licensed, [email or web address] [phone]    70 minutes were spent on the date of the encounter in service to the patient including preparation, face-to-face consultation, documentation and care coordination. The patient brought her spouse, Charlie, to this appointment. Drs. Gudena and/or Lanny were available to discuss this case as needed.  _______________________________________________________________________ For Office Staff:  Number of people involved in session: 2 Was an Intern/ student involved with case: no

## 2024-04-01 ENCOUNTER — Encounter: Payer: Self-pay | Admitting: Family

## 2024-04-01 ENCOUNTER — Ambulatory Visit: Admitting: Family

## 2024-04-01 VITALS — BP 126/86 | HR 100 | Temp 98.1°F | Ht 61.0 in | Wt 171.0 lb

## 2024-04-01 DIAGNOSIS — E119 Type 2 diabetes mellitus without complications: Secondary | ICD-10-CM | POA: Diagnosis not present

## 2024-04-01 DIAGNOSIS — K2100 Gastro-esophageal reflux disease with esophagitis, without bleeding: Secondary | ICD-10-CM

## 2024-04-01 DIAGNOSIS — Z794 Long term (current) use of insulin: Secondary | ICD-10-CM | POA: Diagnosis not present

## 2024-04-01 LAB — POCT GLYCOSYLATED HEMOGLOBIN (HGB A1C): Hemoglobin A1C: 6.2 % — AB (ref 4.0–5.6)

## 2024-04-01 NOTE — Progress Notes (Unsigned)
 Established Patient Office Visit  Subjective:      CC:  Chief Complaint  Patient presents with  . Medical Management of Chronic Issues    HPI: Allison [name] is a 38 y.o. female presenting on 04/01/2024 for Medical Management of Chronic Issues .  Discussed the use of AI scribe software for clinical note transcription with the patient, who gave verbal consent to proceed.  History of Present Illness [facility] Allison [name] is a 39 year old female with hypertension who presents for follow-up of her blood pressure management and elbow pain.  Her blood pressure readings at home have been slightly elevated over the past week, with measurements around 145-150/92 mmHg. She attributes this to taking Nyquil recently. Previously, she experienced a heartbeat sensation in her ears when she was without her blood pressure medication for a week due to a delay in refilling her Ozempic  prescription. She is currently taking valsartan  and amlodipine  10 mg daily. The heartbeat sensation has resolved since resuming her medication.  She is also taking Ozempic , which she believes is at a dose of 0.5 mg, and reports tolerating it well. She has lost 10 pounds since August. Additionally, she is on sertraline  (Zoloft ) and notes feeling less anxious, although recent work-related stress has caused some spikes in anxiety.  She reports significant pain in her left elbow for the past two weeks. The pain is primarily localized to the elbow and sometimes radiates to the other side. She has been taking BC powders and Tylenol for relief, noting that she sometimes takes up to three Tylenols due to the severity of the pain. No recent trauma to the area and she is right-handed. She mentions frequent cleaning activities, which may involve carrying items, but is unsure if this is related to her symptoms. She also noticed a bruise on her hand without a known cause. Occasionally, she experiences tingling in her hand,  particularly when performing certain movements.  No current gynecological issues and her tubes are tied. Reports tingling in the left hand.      Wt Readings from Last 3 Encounters:  04/01/24 171 lb (77.6 kg)  12/31/23 181 lb (82.1 kg)  12/25/23 181 lb (82.1 kg)       Social history:  Relevant past medical, surgical, family and social history reviewed and updated as indicated. Interim medical history since our last visit reviewed.  Allergies and medications reviewed and updated.  DATA REVIEWED: CHART IN EPIC     ROS: Negative unless specifically indicated above in HPI.    Current Outpatient Medications:  .  amLODipine  (NORVASC ) 10 MG tablet, Take 1 tablet (10 mg total) by mouth daily., Disp: 90 tablet, Rfl: 3 .  OZEMPIC , 0.25 OR 0.5 MG/DOSE, 2 MG/3ML SOPN, INJECT 0.5MG  INTO THE SKIN ONCE A WEEK, Disp: 3 mL, Rfl: 0 .  rosuvastatin  (CRESTOR ) 40 MG tablet, Take 1 tablet (40 mg total) by mouth daily., Disp: 90 tablet, Rfl: 3 .  sertraline  (ZOLOFT ) 50 MG tablet, Take 1 tablet (50 mg total) by mouth daily., Disp: 30 tablet, Rfl: 1 .  valsartan  (DIOVAN ) 320 MG tablet, Take 1 tablet (320 mg total) by mouth daily., Disp: 30 tablet, Rfl: 1        Objective:        BP 126/86 (BP Location: Left Arm, Patient Position: Sitting, Cuff Size: Large)   Pulse 100   Temp 98.1 F (36.7 C) (Temporal)   Ht 5' 1 (1.549 m)   Wt 171 lb (77.6 kg)  SpO2 98%   BMI 32.31 kg/m   Physical Exam MUSCULOSKELETAL: Left elbow slightly swollen, non-tender. Pain on finger squeeze. Tingling on hand compression test.  Wt Readings from Last 3 Encounters:  04/01/24 171 lb (77.6 kg)  12/31/23 181 lb (82.1 kg)  12/25/23 181 lb (82.1 kg)    Physical Exam Vitals reviewed.  Constitutional:      General: She is not in acute distress.    Appearance: Normal appearance. She is normal weight. She is not ill-appearing, toxic-appearing or diaphoretic.  HENT:     Head: Normocephalic.   Cardiovascular:     Rate and Rhythm: Normal rate and regular rhythm.  Pulmonary:     Effort: Pulmonary effort is normal.  Musculoskeletal:        General: Normal range of motion.     Left elbow: Swelling present. Normal range of motion. No tenderness.     Left wrist: Tenderness present. No snuff box tenderness. Normal range of motion.     Comments: Tingling with phalens test  Neurological:     General: No focal deficit present.     Mental Status: She is alert and oriented to person, place, and time. Mental status is at baseline.  Psychiatric:        Mood and Affect: Mood normal.        Behavior: Behavior normal.        Thought Content: Thought content normal.        Judgment: Judgment normal.          Results   Assessment & Plan:   Assessment and Plan Assessment & Plan Hypertension with palpitations Blood pressure is well-controlled with valsartan  and amlodipine . Recent elevation due to Nyquil use and missed doses of valsartan , leading to palpitations. Palpitations resolved after resuming medication. - Continue valsartan  320 mg oral daily - Continue amlodipine  10 mg oral daily  Type 2 diabetes mellitus Currently on Ozempic  0.5 mg. Tolerating well and has lost 10 pounds since August. A1c to be checked today. - Continue Ozempic  0.5 mg as prescribed - Performed finger stick A1c today  Hyperlipidemia Cholesterol levels previously checked without fasting, leading to elevated triglycerides. Plan to repeat A1c today. - Performed finger stick A1c today  Anxiety disorder Currently on sertraline  (Zoloft ). Reports some improvement in anxiety levels, though still experiencing spikes related to work stress. Recent work-related stress addressed, providing a baseline for future reference. - Continue sertraline  (Zoloft ) as prescribed - Monitor anxiety levels and consider dose adjustment if symptoms persist  Left elbow pain with possible lateral epicondylitis and carpal tunnel  syndrome Left elbow pain for two weeks, significant enough to require Tylenol. Possible lateral epicondylitis and carpal tunnel syndrome. Pain exacerbated by certain movements and associated with tingling. Differential includes overuse, arthritis, or bursitis. - Start wearing a wrist brace at night to alleviate carpal tunnel symptoms - Consider using a brace for lateral epicondylitis if pain persists - Rest elbow over the weekend, use heat or ice as needed - Will consider prednisone  burst if pain persists, avoiding NSAIDs during this period - Report if symptoms persist  Recording duration: 12 minutes      Return in about 6 months (around 09/29/2024) for f/u CPE.     Ginger Patrick, MSN, APRN, FNP-C [facility]

## 2024-04-04 ENCOUNTER — Telehealth: Payer: Self-pay | Admitting: Family

## 2024-04-04 DIAGNOSIS — M25532 Pain in left wrist: Secondary | ICD-10-CM

## 2024-04-04 NOTE — Telephone Encounter (Signed)
 Copied from CRM [id number]. Topic: Clinical - Prescription Issue >> Apr 04, 2024 12:20 PM Pinkey ORN wrote: Reason for CRM: Prescription >> Apr 04, 2024 12:22 PM Pinkey ORN wrote: Patient states she was last in to see Corwin Antu, FNP on November 21st ans was supposed to have a prescription called into the pharmacy. Patient states nothing was ever called over and when she went to check mychart, there wasn't an after summary documentation there so she's not sure of the medication name. Please follow up with patient.

## 2024-04-05 MED ORDER — OMEPRAZOLE 20 MG PO CPDR: 20.0000 mg | DELAYED_RELEASE_CAPSULE | Freq: Every day | ORAL | 0 refills | Status: AC

## 2024-04-11 ENCOUNTER — Other Ambulatory Visit: Payer: Self-pay | Admitting: Family

## 2024-04-11 DIAGNOSIS — E119 Type 2 diabetes mellitus without complications: Secondary | ICD-10-CM

## 2024-04-11 MED ORDER — PREDNISONE 10 MG (21) PO TBPK: ORAL_TABLET | ORAL | 0 refills | Status: AC

## 2024-04-11 NOTE — Addendum Note (Signed)
 Addended by: CORWIN ANTU on: 04/11/2024 08:52 AM   Modules accepted: Orders

## 2024-05-23 ENCOUNTER — Other Ambulatory Visit: Payer: Self-pay | Admitting: Family

## 2024-05-23 DIAGNOSIS — I10 Essential (primary) hypertension: Secondary | ICD-10-CM

## 2024-05-23 DIAGNOSIS — E119 Type 2 diabetes mellitus without complications: Secondary | ICD-10-CM
